# Patient Record
Sex: Male | Born: 1935 | Race: White | Hispanic: No | Marital: Married | Smoking: Former smoker
Health system: Southern US, Community
[De-identification: ages and names within clinical notes are randomized; demographics above are authoritative.]

## PROBLEM LIST (undated history)

## (undated) DIAGNOSIS — G629 Polyneuropathy, unspecified: Secondary | ICD-10-CM

## (undated) DIAGNOSIS — N400 Enlarged prostate without lower urinary tract symptoms: Secondary | ICD-10-CM

## (undated) DIAGNOSIS — M869 Osteomyelitis, unspecified: Secondary | ICD-10-CM

## (undated) DIAGNOSIS — F1910 Other psychoactive substance abuse, uncomplicated: Secondary | ICD-10-CM

## (undated) DIAGNOSIS — Z8711 Personal history of peptic ulcer disease: Secondary | ICD-10-CM

## (undated) DIAGNOSIS — F329 Major depressive disorder, single episode, unspecified: Secondary | ICD-10-CM

## (undated) DIAGNOSIS — H269 Unspecified cataract: Secondary | ICD-10-CM

## (undated) DIAGNOSIS — M1990 Unspecified osteoarthritis, unspecified site: Secondary | ICD-10-CM

## (undated) DIAGNOSIS — I739 Peripheral vascular disease, unspecified: Secondary | ICD-10-CM

## (undated) DIAGNOSIS — E039 Hypothyroidism, unspecified: Secondary | ICD-10-CM

## (undated) DIAGNOSIS — R51 Headache: Secondary | ICD-10-CM

## (undated) DIAGNOSIS — M2540 Effusion, unspecified joint: Secondary | ICD-10-CM

## (undated) DIAGNOSIS — G4700 Insomnia, unspecified: Secondary | ICD-10-CM

## (undated) DIAGNOSIS — R1013 Epigastric pain: Secondary | ICD-10-CM

## (undated) DIAGNOSIS — C9000 Multiple myeloma not having achieved remission: Secondary | ICD-10-CM

## (undated) DIAGNOSIS — K279 Peptic ulcer, site unspecified, unspecified as acute or chronic, without hemorrhage or perforation: Secondary | ICD-10-CM

## (undated) DIAGNOSIS — D649 Anemia, unspecified: Secondary | ICD-10-CM

## (undated) DIAGNOSIS — M2550 Pain in unspecified joint: Secondary | ICD-10-CM

## (undated) DIAGNOSIS — I6529 Occlusion and stenosis of unspecified carotid artery: Secondary | ICD-10-CM

## (undated) DIAGNOSIS — F419 Anxiety disorder, unspecified: Secondary | ICD-10-CM

## (undated) DIAGNOSIS — K219 Gastro-esophageal reflux disease without esophagitis: Secondary | ICD-10-CM

## (undated) DIAGNOSIS — R609 Edema, unspecified: Secondary | ICD-10-CM

## (undated) DIAGNOSIS — R350 Frequency of micturition: Secondary | ICD-10-CM

## (undated) DIAGNOSIS — E785 Hyperlipidemia, unspecified: Secondary | ICD-10-CM

## (undated) DIAGNOSIS — M109 Gout, unspecified: Secondary | ICD-10-CM

## (undated) DIAGNOSIS — K5900 Constipation, unspecified: Secondary | ICD-10-CM

## (undated) DIAGNOSIS — F32A Depression, unspecified: Secondary | ICD-10-CM

## (undated) DIAGNOSIS — N189 Chronic kidney disease, unspecified: Secondary | ICD-10-CM

## (undated) DIAGNOSIS — M48061 Spinal stenosis, lumbar region without neurogenic claudication: Secondary | ICD-10-CM

## (undated) DIAGNOSIS — M549 Dorsalgia, unspecified: Secondary | ICD-10-CM

## (undated) DIAGNOSIS — I10 Essential (primary) hypertension: Secondary | ICD-10-CM

## (undated) DIAGNOSIS — K5790 Diverticulosis of intestine, part unspecified, without perforation or abscess without bleeding: Secondary | ICD-10-CM

## (undated) DIAGNOSIS — J189 Pneumonia, unspecified organism: Secondary | ICD-10-CM

## (undated) DIAGNOSIS — G8929 Other chronic pain: Secondary | ICD-10-CM

## (undated) DIAGNOSIS — R600 Localized edema: Secondary | ICD-10-CM

## (undated) HISTORY — DX: Spinal stenosis, lumbar region without neurogenic claudication: M48.061

## (undated) HISTORY — PX: VASECTOMY: SHX75

## (undated) HISTORY — DX: Other psychoactive substance abuse, uncomplicated: F19.10

## (undated) HISTORY — DX: Unspecified osteoarthritis, unspecified site: M19.90

## (undated) HISTORY — PX: EYE SURGERY: SHX253

## (undated) HISTORY — DX: Pneumonia, unspecified organism: J18.9

## (undated) HISTORY — DX: Diverticulosis of intestine, part unspecified, without perforation or abscess without bleeding: K57.90

## (undated) HISTORY — DX: Peptic ulcer, site unspecified, unspecified as acute or chronic, without hemorrhage or perforation: K27.9

## (undated) HISTORY — PX: OTHER SURGICAL HISTORY: SHX169

## (undated) HISTORY — DX: Major depressive disorder, single episode, unspecified: F32.9

## (undated) HISTORY — DX: Hypothyroidism, unspecified: E03.9

## (undated) HISTORY — DX: Occlusion and stenosis of unspecified carotid artery: I65.29

## (undated) HISTORY — PX: ESOPHAGOGASTRODUODENOSCOPY: SHX1529

## (undated) HISTORY — DX: Personal history of peptic ulcer disease: Z87.11

## (undated) HISTORY — DX: Depression, unspecified: F32.A

## (undated) HISTORY — DX: Anemia, unspecified: D64.9

## (undated) HISTORY — DX: Peripheral vascular disease, unspecified: I73.9

## (undated) HISTORY — DX: Chronic kidney disease, unspecified: N18.9

## (undated) HISTORY — DX: Gout, unspecified: M10.9

## (undated) HISTORY — DX: Multiple myeloma not having achieved remission: C90.00

## (undated) HISTORY — DX: Benign prostatic hyperplasia without lower urinary tract symptoms: N40.0

## (undated) HISTORY — PX: TONSILLECTOMY: SUR1361

## (undated) HISTORY — DX: Hyperlipidemia, unspecified: E78.5

## (undated) HISTORY — DX: Unspecified cataract: H26.9

## (undated) HISTORY — DX: Epigastric pain: R10.13

## (undated) SURGERY — EGD (ESOPHAGOGASTRODUODENOSCOPY)
Anesthesia: Moderate Sedation

---

## 1997-06-12 HISTORY — PX: OTHER SURGICAL HISTORY: SHX169

## 1998-03-12 HISTORY — PX: ABDOMINAL AORTIC ANEURYSM REPAIR: SHX42

## 1998-03-26 ENCOUNTER — Encounter: Payer: Self-pay | Admitting: Vascular Surgery

## 1998-03-26 ENCOUNTER — Observation Stay (HOSPITAL_COMMUNITY): Admission: RE | Admit: 1998-03-26 | Discharge: 1998-03-27 | Payer: Self-pay | Admitting: Urology

## 1998-03-31 ENCOUNTER — Encounter: Payer: Self-pay | Admitting: Vascular Surgery

## 1998-03-31 ENCOUNTER — Inpatient Hospital Stay: Admission: RE | Admit: 1998-03-31 | Discharge: 1998-04-12 | Payer: Self-pay | Admitting: Vascular Surgery

## 1998-04-01 ENCOUNTER — Encounter: Payer: Self-pay | Admitting: Vascular Surgery

## 1998-04-06 ENCOUNTER — Encounter: Payer: Self-pay | Admitting: Vascular Surgery

## 1998-04-08 ENCOUNTER — Encounter: Payer: Self-pay | Admitting: Vascular Surgery

## 1998-06-12 HISTORY — PX: HERNIA REPAIR: SHX51

## 1998-06-12 HISTORY — PX: ABDOMINAL HERNIA REPAIR: SHX539

## 1998-10-26 ENCOUNTER — Ambulatory Visit (HOSPITAL_COMMUNITY): Admission: RE | Admit: 1998-10-26 | Discharge: 1998-10-26 | Payer: Self-pay

## 1999-02-25 ENCOUNTER — Encounter: Payer: Self-pay | Admitting: Vascular Surgery

## 1999-02-28 ENCOUNTER — Inpatient Hospital Stay (HOSPITAL_COMMUNITY): Admission: RE | Admit: 1999-02-28 | Discharge: 1999-03-08 | Payer: Self-pay | Admitting: Vascular Surgery

## 1999-03-04 ENCOUNTER — Encounter: Payer: Self-pay | Admitting: Vascular Surgery

## 1999-11-22 ENCOUNTER — Inpatient Hospital Stay (HOSPITAL_COMMUNITY): Admission: RE | Admit: 1999-11-22 | Discharge: 1999-11-25 | Payer: Self-pay | Admitting: General Surgery

## 2003-11-26 ENCOUNTER — Ambulatory Visit (HOSPITAL_COMMUNITY): Admission: RE | Admit: 2003-11-26 | Discharge: 2003-11-26 | Payer: Self-pay | Admitting: Family Medicine

## 2003-11-26 ENCOUNTER — Inpatient Hospital Stay (HOSPITAL_COMMUNITY): Admission: EM | Admit: 2003-11-26 | Discharge: 2003-11-28 | Payer: Self-pay | Admitting: Emergency Medicine

## 2003-11-26 ENCOUNTER — Encounter: Admission: RE | Admit: 2003-11-26 | Discharge: 2003-11-26 | Payer: Self-pay | Admitting: Family Medicine

## 2004-02-22 ENCOUNTER — Emergency Department (HOSPITAL_COMMUNITY): Admission: EM | Admit: 2004-02-22 | Discharge: 2004-02-22 | Payer: Self-pay | Admitting: Emergency Medicine

## 2004-07-18 LAB — HM COLONOSCOPY: HM Colonoscopy: NEGATIVE

## 2004-07-22 ENCOUNTER — Ambulatory Visit (HOSPITAL_COMMUNITY): Admission: RE | Admit: 2004-07-22 | Discharge: 2004-07-22 | Payer: Self-pay | Admitting: Gastroenterology

## 2004-07-22 ENCOUNTER — Encounter (INDEPENDENT_AMBULATORY_CARE_PROVIDER_SITE_OTHER): Payer: Self-pay | Admitting: Specialist

## 2004-07-22 HISTORY — PX: COLONOSCOPY: SHX174

## 2005-09-26 ENCOUNTER — Encounter: Admission: RE | Admit: 2005-09-26 | Discharge: 2005-10-30 | Payer: Self-pay | Admitting: Family Medicine

## 2006-01-15 ENCOUNTER — Encounter: Admission: RE | Admit: 2006-01-15 | Discharge: 2006-01-15 | Payer: Self-pay | Admitting: Family Medicine

## 2006-01-22 ENCOUNTER — Ambulatory Visit: Payer: Self-pay | Admitting: Family Medicine

## 2006-03-26 ENCOUNTER — Ambulatory Visit: Payer: Self-pay | Admitting: Family Medicine

## 2006-03-30 ENCOUNTER — Ambulatory Visit: Payer: Self-pay | Admitting: Family Medicine

## 2006-04-05 ENCOUNTER — Ambulatory Visit: Payer: Self-pay | Admitting: Family Medicine

## 2006-04-17 ENCOUNTER — Ambulatory Visit: Payer: Self-pay

## 2006-04-27 ENCOUNTER — Ambulatory Visit: Payer: Self-pay

## 2006-05-07 ENCOUNTER — Ambulatory Visit: Payer: Self-pay | Admitting: Cardiology

## 2006-05-24 ENCOUNTER — Ambulatory Visit: Payer: Self-pay | Admitting: Family Medicine

## 2006-05-25 ENCOUNTER — Encounter: Admission: RE | Admit: 2006-05-25 | Discharge: 2006-05-25 | Payer: Self-pay | Admitting: Family Medicine

## 2006-06-20 ENCOUNTER — Ambulatory Visit: Payer: Self-pay | Admitting: Cardiology

## 2006-06-21 ENCOUNTER — Encounter (INDEPENDENT_AMBULATORY_CARE_PROVIDER_SITE_OTHER): Payer: Self-pay | Admitting: Cardiology

## 2006-06-21 LAB — CONVERTED CEMR LAB: Glucose, Bld: 93 mg/dL (ref 70–99)

## 2006-07-13 ENCOUNTER — Ambulatory Visit: Payer: Self-pay | Admitting: Hematology & Oncology

## 2006-07-13 ENCOUNTER — Ambulatory Visit: Payer: Self-pay | Admitting: Family Medicine

## 2006-07-16 ENCOUNTER — Ambulatory Visit (HOSPITAL_COMMUNITY): Admission: RE | Admit: 2006-07-16 | Discharge: 2006-07-16 | Payer: Self-pay | Admitting: Family Medicine

## 2006-07-17 ENCOUNTER — Ambulatory Visit: Payer: Self-pay

## 2006-08-01 LAB — CBC WITH DIFFERENTIAL/PLATELET
BASO%: 0.4 % (ref 0.0–2.0)
EOS%: 11.7 % — ABNORMAL HIGH (ref 0.0–7.0)
MCH: 36 pg — ABNORMAL HIGH (ref 28.0–33.4)
MCHC: 34.9 g/dL (ref 32.0–35.9)
RBC: 3.33 10*6/uL — ABNORMAL LOW (ref 4.20–5.71)
RDW: 14.7 % — ABNORMAL HIGH (ref 11.2–14.6)
lymph#: 1.7 10*3/uL (ref 0.9–3.3)

## 2006-08-01 LAB — CHCC SMEAR

## 2006-08-04 LAB — KAPPA/LAMBDA LIGHT CHAINS
Kappa free light chain: 2.89 mg/dL — ABNORMAL HIGH (ref 0.33–1.94)
Lambda Free Lght Chn: 20.1 mg/dL — ABNORMAL HIGH (ref 0.57–2.63)

## 2006-08-04 LAB — COMPREHENSIVE METABOLIC PANEL
ALT: 41 U/L (ref 0–53)
AST: 49 U/L — ABNORMAL HIGH (ref 0–37)
Alkaline Phosphatase: 88 U/L (ref 39–117)
Creatinine, Ser: 1.49 mg/dL (ref 0.40–1.50)
Sodium: 138 mEq/L (ref 135–145)
Total Bilirubin: 0.7 mg/dL (ref 0.3–1.2)
Total Protein: 7.5 g/dL (ref 6.0–8.3)

## 2006-08-04 LAB — PROTEIN ELECTROPHORESIS, SERUM
Albumin ELP: 58.2 % (ref 55.8–66.1)
Alpha-2-Globulin: 11.9 % — ABNORMAL HIGH (ref 7.1–11.8)
Beta Globulin: 6.7 % (ref 4.7–7.2)
Total Protein, Serum Electrophoresis: 7.5 g/dL (ref 6.0–8.3)

## 2006-08-04 LAB — BETA 2 MICROGLOBULIN, SERUM: Beta-2 Microglobulin: 4.32 mg/L — ABNORMAL HIGH (ref 1.01–1.73)

## 2006-08-04 LAB — IGG, IGA, IGM: IgM, Serum: 49 mg/dL — ABNORMAL LOW (ref 60–263)

## 2006-08-06 ENCOUNTER — Encounter (INDEPENDENT_AMBULATORY_CARE_PROVIDER_SITE_OTHER): Payer: Self-pay | Admitting: *Deleted

## 2006-08-06 ENCOUNTER — Ambulatory Visit (HOSPITAL_COMMUNITY): Admission: RE | Admit: 2006-08-06 | Discharge: 2006-08-06 | Payer: Self-pay | Admitting: Hematology & Oncology

## 2006-08-06 LAB — UIFE/LIGHT CHAINS/TP QN, 24-HR UR
Albumin, U: DETECTED
Alpha 1, Urine: DETECTED — AB
Beta, Urine: DETECTED — AB
Free Kappa Lt Chains,Ur: 8.33 mg/dL — ABNORMAL HIGH (ref 0.04–1.51)
Free Lambda Excretion/Day: 540.35 mg/d
Free Lambda Lt Chains,Ur: 21.4 mg/dL — ABNORMAL HIGH (ref 0.08–1.01)
Gamma Globulin, Urine: DETECTED — AB
Time: 24 hours
Volume, Urine: 2525 mL

## 2006-09-10 ENCOUNTER — Ambulatory Visit: Payer: Self-pay | Admitting: Family Medicine

## 2006-09-17 ENCOUNTER — Ambulatory Visit: Payer: Self-pay | Admitting: *Deleted

## 2006-09-17 ENCOUNTER — Inpatient Hospital Stay (HOSPITAL_COMMUNITY): Admission: RE | Admit: 2006-09-17 | Discharge: 2006-09-19 | Payer: Self-pay | Admitting: *Deleted

## 2006-10-15 ENCOUNTER — Ambulatory Visit: Payer: Self-pay | Admitting: Hematology & Oncology

## 2006-10-17 LAB — CBC WITH DIFFERENTIAL/PLATELET
Eosinophils Absolute: 0.2 10*3/uL (ref 0.0–0.5)
HCT: 31.9 % — ABNORMAL LOW (ref 38.7–49.9)
LYMPH%: 20.7 % (ref 14.0–48.0)
MCV: 99.8 fL — ABNORMAL HIGH (ref 81.6–98.0)
MONO#: 0.9 10*3/uL (ref 0.1–0.9)
MONO%: 13.1 % — ABNORMAL HIGH (ref 0.0–13.0)
NEUT#: 4.2 10*3/uL (ref 1.5–6.5)
NEUT%: 62.9 % (ref 40.0–75.0)
Platelets: 183 10*3/uL (ref 145–400)
RBC: 3.2 10*6/uL — ABNORMAL LOW (ref 4.20–5.71)
WBC: 6.7 10*3/uL (ref 4.0–10.0)

## 2006-10-19 LAB — IGG, IGA, IGM
IgA: 293 mg/dL (ref 68–378)
IgG (Immunoglobin G), Serum: 916 mg/dL (ref 694–1618)
IgM, Serum: 34 mg/dL — ABNORMAL LOW (ref 60–263)

## 2006-10-19 LAB — PROTEIN ELECTROPHORESIS, SERUM
Albumin ELP: 56.6 % (ref 55.8–66.1)
Alpha-1-Globulin: 4.5 % (ref 2.9–4.9)
Beta 2: 6.9 % — ABNORMAL HIGH (ref 3.2–6.5)
Beta Globulin: 7 % (ref 4.7–7.2)
Total Protein, Serum Electrophoresis: 6.7 g/dL (ref 6.0–8.3)

## 2006-10-19 LAB — COMPREHENSIVE METABOLIC PANEL
Alkaline Phosphatase: 67 U/L (ref 39–117)
BUN: 27 mg/dL — ABNORMAL HIGH (ref 6–23)
CO2: 24 mEq/L (ref 19–32)
Creatinine, Ser: 1.24 mg/dL (ref 0.40–1.50)
Glucose, Bld: 108 mg/dL — ABNORMAL HIGH (ref 70–99)
Total Bilirubin: 0.5 mg/dL (ref 0.3–1.2)
Total Protein: 6.7 g/dL (ref 6.0–8.3)

## 2006-10-23 LAB — UIFE/LIGHT CHAINS/TP QN, 24-HR UR
Alpha 1, Urine: DETECTED — AB
Free Kappa Lt Chains,Ur: 3.33 mg/dL — ABNORMAL HIGH (ref 0.04–1.51)
Free Kappa/Lambda Ratio: 0.17 ratio — ABNORMAL LOW (ref 0.46–4.00)
Free Lambda Excretion/Day: 565.5 mg/d
Free Lt Chn Excr Rate: 96.57 mg/d
Time: 24 hours
Total Protein, Urine-Ur/day: 676 mg/d — ABNORMAL HIGH (ref 10–140)
Total Protein, Urine: 23.3 mg/dL

## 2006-10-31 ENCOUNTER — Ambulatory Visit: Payer: Self-pay | Admitting: Family Medicine

## 2006-11-06 ENCOUNTER — Encounter: Admission: RE | Admit: 2006-11-06 | Discharge: 2006-11-06 | Payer: Self-pay | Admitting: Specialist

## 2006-11-27 ENCOUNTER — Ambulatory Visit: Payer: Self-pay | Admitting: Family Medicine

## 2007-01-03 ENCOUNTER — Ambulatory Visit: Payer: Self-pay | Admitting: Family Medicine

## 2007-01-31 ENCOUNTER — Ambulatory Visit: Payer: Self-pay | Admitting: Family Medicine

## 2007-03-21 ENCOUNTER — Ambulatory Visit: Payer: Self-pay | Admitting: Hematology & Oncology

## 2007-03-25 LAB — CBC WITH DIFFERENTIAL/PLATELET
Basophils Absolute: 0 10*3/uL (ref 0.0–0.1)
Eosinophils Absolute: 0.3 10*3/uL (ref 0.0–0.5)
HGB: 11.9 g/dL — ABNORMAL LOW (ref 13.0–17.1)
LYMPH%: 31 % (ref 14.0–48.0)
MCV: 90.7 fL (ref 81.6–98.0)
MONO%: 10.2 % (ref 0.0–13.0)
NEUT#: 2.9 10*3/uL (ref 1.5–6.5)
NEUT%: 53.4 % (ref 40.0–75.0)
Platelets: 190 10*3/uL (ref 145–400)

## 2007-03-25 LAB — UIFE/LIGHT CHAINS/TP QN, 24-HR UR
Free Kappa Lt Chains,Ur: 6.04 mg/dL — ABNORMAL HIGH (ref 0.04–1.51)
Free Lt Chn Excr Rate: 120.8 mg/d
Gamma Globulin, Urine: DETECTED — AB
Time: 24 hours

## 2007-03-27 LAB — COMPREHENSIVE METABOLIC PANEL
Albumin: 4.3 g/dL (ref 3.5–5.2)
Alkaline Phosphatase: 46 U/L (ref 39–117)
BUN: 55 mg/dL — ABNORMAL HIGH (ref 6–23)
Creatinine, Ser: 2.68 mg/dL — ABNORMAL HIGH (ref 0.40–1.50)
Glucose, Bld: 93 mg/dL (ref 70–99)
Total Bilirubin: 0.5 mg/dL (ref 0.3–1.2)

## 2007-03-27 LAB — BETA 2 MICROGLOBULIN, SERUM: Beta-2 Microglobulin: 4.73 mg/L — ABNORMAL HIGH (ref 1.01–1.73)

## 2007-03-27 LAB — PROTEIN ELECTROPHORESIS, SERUM
Alpha-2-Globulin: 10.3 % (ref 7.1–11.8)
Beta Globulin: 11.9 % — ABNORMAL HIGH (ref 4.7–7.2)
Gamma Globulin: 10.4 % — ABNORMAL LOW (ref 11.1–18.8)
M-Spike, %: 0.39 g/dL
Total Protein, Serum Electrophoresis: 6.8 g/dL (ref 6.0–8.3)

## 2007-03-27 LAB — KAPPA/LAMBDA LIGHT CHAINS: Kappa free light chain: 1.47 mg/dL (ref 0.33–1.94)

## 2007-03-27 LAB — IGG, IGA, IGM
IgA: 307 mg/dL (ref 68–378)
IgM, Serum: 39 mg/dL — ABNORMAL LOW (ref 60–263)

## 2007-04-02 ENCOUNTER — Ambulatory Visit: Payer: Self-pay | Admitting: Family Medicine

## 2007-06-03 ENCOUNTER — Ambulatory Visit: Payer: Self-pay | Admitting: Family Medicine

## 2007-06-28 ENCOUNTER — Ambulatory Visit: Payer: Self-pay | Admitting: Family Medicine

## 2007-08-01 ENCOUNTER — Ambulatory Visit: Payer: Self-pay | Admitting: Hematology & Oncology

## 2007-08-12 LAB — CBC WITH DIFFERENTIAL/PLATELET
Basophils Absolute: 0 10*3/uL (ref 0.0–0.1)
Eosinophils Absolute: 0.4 10*3/uL (ref 0.0–0.5)
HCT: 32.6 % — ABNORMAL LOW (ref 38.7–49.9)
HGB: 11.3 g/dL — ABNORMAL LOW (ref 13.0–17.1)
MONO#: 0.6 10*3/uL (ref 0.1–0.9)
NEUT#: 3 10*3/uL (ref 1.5–6.5)
NEUT%: 53.7 % (ref 40.0–75.0)
WBC: 5.5 10*3/uL (ref 4.0–10.0)
lymph#: 1.5 10*3/uL (ref 0.9–3.3)

## 2007-08-16 LAB — PROTEIN ELECTROPHORESIS, SERUM
Alpha-1-Globulin: 4 % (ref 2.9–4.9)
Beta 2: 2.5 % — ABNORMAL LOW (ref 3.2–6.5)
Gamma Globulin: 9.8 % — ABNORMAL LOW (ref 11.1–18.8)

## 2007-08-16 LAB — UIFE/LIGHT CHAINS/TP QN, 24-HR UR
Free Kappa/Lambda Ratio: 0.19 ratio — ABNORMAL LOW (ref 0.46–4.00)
Free Lambda Lt Chains,Ur: 16.4 mg/dL — ABNORMAL HIGH (ref 0.08–1.01)
Free Lt Chn Excr Rate: 57.91 mg/d
Total Protein, Urine-Ur/day: 368 mg/d — ABNORMAL HIGH (ref 10–140)
Total Protein, Urine: 19.9 mg/dL
Volume, Urine: 1850 mL

## 2007-08-16 LAB — COMPREHENSIVE METABOLIC PANEL
ALT: 9 U/L (ref 0–53)
AST: 16 U/L (ref 0–37)
Alkaline Phosphatase: 37 U/L — ABNORMAL LOW (ref 39–117)
Glucose, Bld: 90 mg/dL (ref 70–99)
Sodium: 139 mEq/L (ref 135–145)
Total Bilirubin: 0.5 mg/dL (ref 0.3–1.2)
Total Protein: 6.8 g/dL (ref 6.0–8.3)

## 2007-08-16 LAB — KAPPA/LAMBDA LIGHT CHAINS: Kappa:Lambda Ratio: 0.15 — ABNORMAL LOW (ref 0.26–1.65)

## 2007-08-16 LAB — IGG, IGA, IGM: IgA: 238 mg/dL (ref 68–378)

## 2007-10-01 ENCOUNTER — Ambulatory Visit: Payer: Self-pay | Admitting: Family Medicine

## 2007-10-25 ENCOUNTER — Ambulatory Visit: Payer: Self-pay | Admitting: Family Medicine

## 2008-02-10 ENCOUNTER — Ambulatory Visit: Payer: Self-pay | Admitting: Hematology & Oncology

## 2008-02-25 ENCOUNTER — Ambulatory Visit: Payer: Self-pay | Admitting: Family Medicine

## 2008-03-03 ENCOUNTER — Ambulatory Visit: Payer: Self-pay | Admitting: Family Medicine

## 2008-03-12 ENCOUNTER — Ambulatory Visit: Payer: Self-pay | Admitting: Hematology & Oncology

## 2008-03-13 LAB — CBC WITH DIFFERENTIAL (CANCER CENTER ONLY)
Eosinophils Absolute: 0.9 10*3/uL — ABNORMAL HIGH (ref 0.0–0.5)
HCT: 31.7 % — ABNORMAL LOW (ref 38.7–49.9)
LYMPH%: 27.1 % (ref 14.0–48.0)
MCH: 30.6 pg (ref 28.0–33.4)
MCV: 91 fL (ref 82–98)
MONO#: 0.5 10*3/uL (ref 0.1–0.9)
MONO%: 8.4 % (ref 0.0–13.0)
NEUT%: 48.1 % (ref 40.0–80.0)
RBC: 3.46 10*6/uL — ABNORMAL LOW (ref 4.20–5.70)
WBC: 5.5 10*3/uL (ref 4.0–10.0)

## 2008-03-13 LAB — COMPREHENSIVE METABOLIC PANEL
Albumin: 4.3 g/dL (ref 3.5–5.2)
Alkaline Phosphatase: 42 U/L (ref 39–117)
BUN: 48 mg/dL — ABNORMAL HIGH (ref 6–23)
CO2: 15 mEq/L — ABNORMAL LOW (ref 19–32)
Glucose, Bld: 95 mg/dL (ref 70–99)
Sodium: 136 mEq/L (ref 135–145)
Total Bilirubin: 0.4 mg/dL (ref 0.3–1.2)
Total Protein: 6.9 g/dL (ref 6.0–8.3)

## 2008-03-17 LAB — SPEP & IFE WITH QIG
Alpha-1-Globulin: 4 % (ref 2.9–4.9)
Beta 2: 2.8 % — ABNORMAL LOW (ref 3.2–6.5)
Gamma Globulin: 11.3 % (ref 11.1–18.8)
IgA: 237 mg/dL (ref 68–378)
IgM, Serum: 35 mg/dL — ABNORMAL LOW (ref 60–263)

## 2008-03-17 LAB — KAPPA/LAMBDA LIGHT CHAINS
Kappa:Lambda Ratio: 0.13 — ABNORMAL LOW (ref 0.26–1.65)
Lambda Free Lght Chn: 23.4 mg/dL — ABNORMAL HIGH (ref 0.57–2.63)

## 2008-03-17 LAB — UIFE/LIGHT CHAINS/TP QN, 24-HR UR
Alpha 1, Urine: DETECTED — AB
Alpha 2, Urine: DETECTED — AB
Beta, Urine: DETECTED — AB
Free Lt Chn Excr Rate: 311.68 mg/d
Total Protein, Urine-Ur/day: 855 mg/d — ABNORMAL HIGH (ref 10–140)

## 2008-05-06 ENCOUNTER — Encounter: Admission: RE | Admit: 2008-05-06 | Discharge: 2008-05-06 | Payer: Self-pay | Admitting: Family Medicine

## 2008-05-06 ENCOUNTER — Ambulatory Visit: Payer: Self-pay | Admitting: Family Medicine

## 2008-05-11 ENCOUNTER — Encounter: Admission: RE | Admit: 2008-05-11 | Discharge: 2008-05-11 | Payer: Self-pay | Admitting: Family Medicine

## 2008-05-13 ENCOUNTER — Ambulatory Visit: Payer: Self-pay | Admitting: Family Medicine

## 2008-06-17 ENCOUNTER — Ambulatory Visit: Payer: Self-pay | Admitting: Family Medicine

## 2008-09-16 ENCOUNTER — Ambulatory Visit: Payer: Self-pay | Admitting: Hematology & Oncology

## 2008-09-17 ENCOUNTER — Ambulatory Visit: Payer: Self-pay | Admitting: Family Medicine

## 2008-12-14 ENCOUNTER — Emergency Department (HOSPITAL_COMMUNITY): Admission: EM | Admit: 2008-12-14 | Discharge: 2008-12-14 | Payer: Self-pay | Admitting: Emergency Medicine

## 2009-02-11 ENCOUNTER — Ambulatory Visit: Payer: Self-pay | Admitting: Family Medicine

## 2009-02-18 ENCOUNTER — Ambulatory Visit: Payer: Self-pay | Admitting: Family Medicine

## 2009-03-19 ENCOUNTER — Ambulatory Visit: Payer: Self-pay | Admitting: Family Medicine

## 2009-06-17 ENCOUNTER — Ambulatory Visit: Payer: Self-pay | Admitting: Family Medicine

## 2009-09-08 ENCOUNTER — Encounter: Admission: RE | Admit: 2009-09-08 | Discharge: 2009-09-08 | Payer: Self-pay | Admitting: Family Medicine

## 2009-10-13 ENCOUNTER — Ambulatory Visit: Payer: Self-pay | Admitting: Family Medicine

## 2009-10-15 ENCOUNTER — Emergency Department (HOSPITAL_COMMUNITY): Admission: EM | Admit: 2009-10-15 | Discharge: 2009-10-15 | Payer: Self-pay | Admitting: Emergency Medicine

## 2009-10-20 ENCOUNTER — Ambulatory Visit: Payer: Self-pay | Admitting: Family Medicine

## 2009-10-20 ENCOUNTER — Encounter: Admission: RE | Admit: 2009-10-20 | Discharge: 2009-10-20 | Payer: Self-pay | Admitting: Family Medicine

## 2009-12-22 ENCOUNTER — Observation Stay (HOSPITAL_COMMUNITY): Admission: EM | Admit: 2009-12-22 | Discharge: 2009-12-23 | Payer: Self-pay | Admitting: Emergency Medicine

## 2009-12-22 ENCOUNTER — Ambulatory Visit: Payer: Self-pay | Admitting: Family Medicine

## 2010-02-16 ENCOUNTER — Ambulatory Visit: Payer: Self-pay | Admitting: Family Medicine

## 2010-05-23 ENCOUNTER — Ambulatory Visit: Payer: Self-pay | Admitting: Family Medicine

## 2010-06-07 ENCOUNTER — Ambulatory Visit
Admission: RE | Admit: 2010-06-07 | Discharge: 2010-06-07 | Payer: Self-pay | Source: Home / Self Care | Attending: Family Medicine | Admitting: Family Medicine

## 2010-06-20 ENCOUNTER — Ambulatory Visit
Admission: RE | Admit: 2010-06-20 | Discharge: 2010-06-20 | Payer: Self-pay | Source: Home / Self Care | Attending: Family Medicine | Admitting: Family Medicine

## 2010-06-22 ENCOUNTER — Ambulatory Visit: Admit: 2010-06-22 | Payer: Self-pay | Admitting: Orthopedic Surgery

## 2010-07-03 ENCOUNTER — Encounter: Payer: Self-pay | Admitting: Specialist

## 2010-07-03 ENCOUNTER — Encounter: Payer: Self-pay | Admitting: Family Medicine

## 2010-08-28 LAB — GLUCOSE, CAPILLARY
Glucose-Capillary: 172 mg/dL — ABNORMAL HIGH (ref 70–99)
Glucose-Capillary: 77 mg/dL (ref 70–99)

## 2010-08-28 LAB — URINE MICROSCOPIC-ADD ON

## 2010-08-28 LAB — FOLATE: Folate: >20 ng/mL

## 2010-08-28 LAB — PROTIME-INR: Prothrombin Time: 14.1 seconds (ref 11.6–15.2)

## 2010-08-28 LAB — BASIC METABOLIC PANEL
BUN: 27 mg/dL — ABNORMAL HIGH (ref 6–23)
Chloride: 110 mEq/L (ref 96–112)
GFR calc non Af Amer: >60 mL/min (ref >60)
Glucose, Bld: 130 mg/dL — ABNORMAL HIGH (ref 70–99)
Potassium: 4.9 mEq/L (ref 3.5–5.1)
Sodium: 139 mEq/L (ref 135–145)

## 2010-08-28 LAB — COMPREHENSIVE METABOLIC PANEL
ALT: 14 U/L (ref 0–53)
Albumin: 3.3 g/dL — ABNORMAL LOW (ref 3.5–5.2)
Alkaline Phosphatase: 58 U/L (ref 39–117)
Calcium: 8.8 mg/dL (ref 8.4–10.5)
Chloride: 114 mEq/L — ABNORMAL HIGH (ref 96–112)
GFR calc Af Amer: 55 mL/min — ABNORMAL LOW (ref >60)
Potassium: 4.6 mEq/L (ref 3.5–5.1)
Total Bilirubin: 0.3 mg/dL (ref 0.3–1.2)

## 2010-08-28 LAB — LIPASE, BLOOD: Lipase: 20 U/L (ref 11–59)

## 2010-08-28 LAB — CBC
HCT: 29.4 % — ABNORMAL LOW (ref 39.0–52.0)
MCHC: 33.1 g/dL (ref 30.0–36.0)
MCHC: 34.1 g/dL (ref 30.0–36.0)
Platelets: 163 10*3/uL (ref 150–400)
RDW: 13.2 % (ref 11.5–15.5)
RDW: 13.3 % (ref 11.5–15.5)
WBC: 5.8 10*3/uL (ref 4.0–10.5)

## 2010-08-28 LAB — TSH: TSH: 4.174 u[IU]/mL (ref 0.350–4.500)

## 2010-08-28 LAB — CK TOTAL AND CKMB (NOT AT ARMC): CK, MB: 1.6 ng/mL (ref 0.3–4.0)

## 2010-08-28 LAB — DIFFERENTIAL
Eosinophils Absolute: 0.7 10*3/uL (ref 0.0–0.7)
Eosinophils Relative: 12 % — ABNORMAL HIGH (ref 0–5)
Monocytes Absolute: 0.7 10*3/uL (ref 0.1–1.0)
Neutrophils Relative %: 55 % (ref 43–77)

## 2010-08-28 LAB — URINALYSIS, ROUTINE W REFLEX MICROSCOPIC
Hgb urine dipstick: NEGATIVE
Protein, ur: NEGATIVE mg/dL
Specific Gravity, Urine: 1.021 (ref 1.005–1.030)
Urobilinogen, UA: 0.2 mg/dL (ref 0.0–1.0)
pH: 5 (ref 5.0–8.0)

## 2010-08-28 LAB — CARDIAC PANEL(CRET KIN+CKTOT+MB+TROPI)
CK, MB: 1.6 ng/mL (ref 0.3–4.0)
Relative Index: INVALID (ref 0.0–2.5)
Total CK: 31 U/L (ref 7–232)
Troponin I: 0.03 ng/mL (ref 0.00–0.06)
Troponin I: <0.01 ng/mL (ref 0.00–0.06)

## 2010-08-28 LAB — RETICULOCYTES: Retic Count, Absolute: 85.4 10*3/uL (ref 19.0–186.0)

## 2010-08-28 LAB — POCT CARDIAC MARKERS: Myoglobin, poc: 111 ng/mL (ref 12–200)

## 2010-08-28 LAB — IRON AND TIBC
Iron: 86 ug/dL (ref 42–135)
Saturation Ratios: 31 % (ref 20–55)
TIBC: 274 ug/dL (ref 215–435)

## 2010-08-28 LAB — VITAMIN B12: Vitamin B-12: 502 pg/mL (ref 211–911)

## 2010-08-28 LAB — T4, FREE: Free T4: 1.11 ng/dL (ref 0.80–1.80)

## 2010-10-14 ENCOUNTER — Ambulatory Visit (INDEPENDENT_AMBULATORY_CARE_PROVIDER_SITE_OTHER): Payer: Medicare Other | Admitting: Medical

## 2010-10-14 DIAGNOSIS — R42 Dizziness and giddiness: Secondary | ICD-10-CM

## 2010-10-14 DIAGNOSIS — E039 Hypothyroidism, unspecified: Secondary | ICD-10-CM

## 2010-10-14 DIAGNOSIS — R197 Diarrhea, unspecified: Secondary | ICD-10-CM

## 2010-10-14 DIAGNOSIS — I998 Other disorder of circulatory system: Secondary | ICD-10-CM

## 2010-10-18 ENCOUNTER — Telehealth: Payer: Self-pay | Admitting: *Deleted

## 2010-10-18 NOTE — Telephone Encounter (Addendum)
Message copied by Ellsworth Lennox on Tue Oct 18, 2010  3:31 PM ------      Message from: [place], DAVID      Created: Tue Oct 18, 2010  2:13 PM       1) Labs - thyroid normal, diabetes marker A1C is worse than last check at 6.6%.  We can discuss further at next visit.      2) how is he feeling?  Is he hydrating well, has the dizziness improved?       3) how is he doing with the lower dose of Lisinopril (1/2 tablet daily)?        4) recheck in 1wk as we discussed with his regular provider   Pt notified of lab results.  Pt stated " not feeling any better, still feel dizzy.  I am still taking the whole dose of Lisinopril instead of 1/2 dose."  Scheduled for recheck on 10-21-10.  CM, LPN

## 2010-10-20 ENCOUNTER — Encounter: Payer: Self-pay | Admitting: Medical

## 2010-10-21 ENCOUNTER — Encounter: Payer: Self-pay | Admitting: Medical

## 2010-10-21 ENCOUNTER — Ambulatory Visit (INDEPENDENT_AMBULATORY_CARE_PROVIDER_SITE_OTHER): Payer: Medicare Other | Admitting: Medical

## 2010-10-21 VITALS — BP 140/80 | HR 72 | Ht 75.0 in | Wt 266.0 lb

## 2010-10-21 DIAGNOSIS — R202 Paresthesia of skin: Secondary | ICD-10-CM

## 2010-10-21 DIAGNOSIS — D649 Anemia, unspecified: Secondary | ICD-10-CM

## 2010-10-21 DIAGNOSIS — R42 Dizziness and giddiness: Secondary | ICD-10-CM

## 2010-10-21 DIAGNOSIS — R209 Unspecified disturbances of skin sensation: Secondary | ICD-10-CM

## 2010-10-21 DIAGNOSIS — N289 Disorder of kidney and ureter, unspecified: Secondary | ICD-10-CM

## 2010-10-21 DIAGNOSIS — E1129 Type 2 diabetes mellitus with other diabetic kidney complication: Secondary | ICD-10-CM | POA: Insufficient documentation

## 2010-10-21 DIAGNOSIS — E119 Type 2 diabetes mellitus without complications: Secondary | ICD-10-CM

## 2010-10-21 MED ORDER — AUTO-LANCET MISC: Status: DC

## 2010-10-21 MED ORDER — GLIPIZIDE 5 MG PO TABS: ORAL_TABLET | ORAL | Status: DC

## 2010-10-21 MED ORDER — GLUCOSE BLOOD VI STRP: ORAL_STRIP | Status: DC

## 2010-10-21 NOTE — Patient Instructions (Signed)
Dizziness - don't drink any alcohol for the next week or 2 until we see if the dizziness will resolve.  Begin wearing your new glasses today which will hopefully help.  Call report in 3-4 days.  Diabetes type II - discussed his new diagnosis.  Begin Glipizide 2.5mg  daily.  Discussed risks including possible low blood sugar and what to do if this occurs.   Advised that once we have the dizziness under control, then we can see him back in eval for preop clearance for upcoming knee surgery.

## 2010-10-21 NOTE — Progress Notes (Deleted)
Still having a lot of dizziness, with glasses off 75% goes away, new glasses ready for pickup.  No cradio change, no breathing change, no neuro change.  6.6%   Glucose 145 .  Surgery date august.  No hearing change, loss of hearing longstanding, not bad enour for hearing aid.  Right foot, gets burning at night. Stays red. Intolerant to chole meds - gets rahses, wheals, itcheing with all of them.  Stress test -  Last in [place], [address]  Normal.  Last CXR?  Increased urinary frquency.  Mild polydipsia.  Not skipping meals.  Dizziness currently every time he stands, lastss all day.   Renal stone surgery 1999, hospital - falls, right clavicle fracutre. Podiatry prior.

## 2010-10-21 NOTE — Progress Notes (Signed)
Subjective:    Patient ID: Zachary Vargas, male    DOB: 1928-05-28, 77 y.o.   MRN: [id number]  HPI 1) here for recheck.  Saw me on 10/14/10 for dizziness.  At that time given his recent diarrhea and decreased oral intake, we assumed some of the etiology could be dehydration vs. medication related.  Since 10/14/10 he has increased his hydration, not skipping meals, but he never cut his Lisinopril in half as his blood pressure remained fine.  He has checked his blood sugar periodically with an old meter, and was getting readings in the 140s.  He notes prior diagnosis of borderline diabetes.  He wonders if his sugar is affecting his dizziness.  He also notes that any time he takes his glasses off, the dizziness seems to resolve.  Otherwise, when wearing his glasses, he has dizziness that persists all day.  He actually goes to pick up his new glasses today.     2) He c/o right foot redness and burning sensation at times.  Denies hx/o neuropathy.  This is worse with showering.  He has seen podiatry prior in remote past, but was unhappy with that care, says the recommendations and interventions done then didn't help.  He notes that his feet rub in his shoes, no matter the shoe type, has thick nails, and gets right foot pain regularly.     3) He needs preop clearance for ortho.  Will be having knee replacement in August 2012.  He notes the last stress test was in [place] on [address], several years ago, normal.  Doesn't recall last CXR.    4) Also here to recheck on labs done last visit.      Review of Systems  Constitutional: Negative for fever, chills, diaphoresis, activity change, appetite change, fatigue and unexpected weight change.  HENT: Positive for hearing loss and rhinorrhea. Negative for ear pain, congestion, sore throat, sneezing, trouble swallowing, neck pain, neck stiffness, voice change, sinus pressure and tinnitus.        [Chronic hearing loss, occasional allergy congestion Eyes: Negative  for pain, redness, itching and visual disturbance.  Respiratory: Negative.  Negative for shortness of breath and wheezing.   Cardiovascular: Negative for chest pain, palpitations and leg swelling.  Gastrointestinal: Negative for nausea, vomiting, abdominal pain, diarrhea, constipation and blood in stool.  Genitourinary: Negative for dysuria, urgency, decreased urine volume and difficulty urinating.  Musculoskeletal: Positive for arthralgias and gait problem. Negative for myalgias, back pain and joint swelling.       [Knee pain due to "bone on bone", bilat, awaiting surgery in August Skin: Negative for pallor.  Neurological: Positive for dizziness. Negative for tremors, syncope, speech difficulty, weakness, numbness and headaches.  Hematological: Negative for adenopathy. Bruises/bleeds easily.  Psychiatric/Behavioral: Negative for sleep disturbance.   Past Medical History  Diagnosis Date  . Glaucoma   . Diabetes mellitus   . Anemia     of chronic disease  . History of aortic aneurysm repair   . Hypothyroidism   . Myeloma     IgA related  . History of renal stone   . Diverticulitis   . Dyslipidemia   . Colon polyps   . Melanoma in situ   . Gout   . Allergy   . BPH (benign prostatic hyperplasia)   . History of peptic ulcer disease       Objective:   Physical Exam  Constitutional: He is oriented to person, place, and time. He appears well-developed and well-nourished.  No distress.       overweight  HENT:  Head: Normocephalic.  Right Ear: Tympanic membrane and ear canal normal.  Left Ear: Tympanic membrane and ear canal normal.  Nose: Nose normal. No mucosal edema, rhinorrhea or sinus tenderness. Right sinus exhibits no maxillary sinus tenderness and no frontal sinus tenderness. Left sinus exhibits no maxillary sinus tenderness and no frontal sinus tenderness.  Mouth/Throat: Oropharynx is clear and moist and mucous membranes are normal. Normal dentition. No posterior  oropharyngeal erythema.  Eyes: Conjunctivae and EOM are normal. Right eye exhibits no discharge. Left eye exhibits no discharge. No scleral icterus.       Left eye not as reactive compared to right, +hx/o glaucoma  Neck: Normal range of motion. Neck supple. No JVD present. No thyromegaly present.       No bruits  Cardiovascular: Normal rate, regular rhythm and normal heart sounds.   No murmur heard.      Pedal pulses decreased compared to radial pulses, cap refill normal  Pulmonary/Chest: Effort normal and breath sounds normal. He has no wheezes.  Abdominal: Soft. Bowel sounds are normal. He exhibits no distension and no mass. There is no tenderness. There is no rebound and no guarding.       Vertical surgical scar+  Musculoskeletal: He exhibits edema and tenderness.       1+ edema or ankles, generalized tenderness of bilat knees, left knee with mild lateral laxity, difficulty getting up and down from chair given the knee pains  Lymphadenopathy:    He has no cervical adenopathy.  Neurological: He is alert and oriented to person, place, and time. He has normal reflexes.  Skin: Skin is warm and dry. He is not diaphoretic.       bilat arms with several areas of ecchymosis in different stages of healing, and he notes longstanding problems with arm bruising.  However, his left arm is somewhat improved compared to last visit.   Psychiatric: He has a normal mood and affect. His behavior is normal. Judgment and thought content normal.       Assessment & Plan:   Encounter Diagnoses  Name Primary?  . Dizziness Yes  . Type II or unspecified type diabetes mellitus without mention of complication, not stated as uncontrolled   . Renal insufficiency   . Anemia   . Paresthesia    Dizziness - I made some suggestions regarding his current medications, however he declines.  I recommended we consider trial of Meclizine instead of Hydroxyzine, but he really feels like Hydroxyzine helps, takes this 3-4  times daily, and refuses to stop this.  He also refuses to stop Ambien.  He did agree to not drink any alcohol for the next week or 2 until we see if the dizziness will resolve.  He gets his new glasses today which will hopefully help.  Discussed fall prevention, c/t using crutch or cane.  Call report in 3-4 days.  Diabetes type II - discussed his new diagnosis with recent HgbA1C of 6.6%.  He requests to start medication for diabetes since his recent glucose has been elevated and to see if this will help the dizziness.  We discussed his sugar numbers, medication risks/benefits, and he would like to test his glucose once daily.  We will begin Glipizide low dose of 2.mg daily.  Discussed what to do if hypoglycemic episode.  Renal insufficiency - recent BUN 35, Creatinine 1.70.  We will continue to monitor this.   Advised he continue good  hydration with water.  Anemia - anemia of chronic disease, stable        Paresthesia - discussed possible diabetic nephropathy.  Given his feet exam, nails, calluses, I advised podiatry referral despite the fact he had a prior unsatisfactory podiatry consult in remote past.  He is agreeable.   Advised that once we have the dizziness under control, then we can see him back in eval for preop clearance for upcoming knee surgery.

## 2010-10-24 ENCOUNTER — Encounter: Payer: Self-pay | Admitting: Medical

## 2010-10-24 ENCOUNTER — Telehealth: Payer: Self-pay | Admitting: *Deleted

## 2010-10-24 NOTE — Telephone Encounter (Addendum)
Message copied by Ellsworth Lennox on Mon Oct 24, 2010 12:06 PM ------      Message from: Aleen Campi, DAVID      Created: Fri Oct 21, 2010  6:03 PM       Pls refer to podiatry for diabetic foot exam, foot care, callous, toenail care.  He has seen a podiatrist before, but don't refer to that one.     Pt was scheduled with [facility] with Dr. Harriet Pho on November 16, 2010, at 3:40pm.  Pt informed of appointment.  CM, LPN

## 2010-10-25 ENCOUNTER — Telehealth: Payer: Self-pay | Admitting: Medical

## 2010-10-26 NOTE — Telephone Encounter (Signed)
Per Vincenza Hews note read and noted in chart

## 2010-10-28 NOTE — Op Note (Signed)
Zachary Vargas, Zachary Vargas               ACCOUNT NO.:  [id number]   MEDICAL RECORD NO.:  [id number]          PATIENT TYPE:  AMB   LOCATION:  ENDO                         FACILITY:  [facility]   PHYSICIAN:  John C. Madilyn Fireman, M.D.    DATE OF BIRTH:  19-Mar-1928   DATE OF PROCEDURE:  07/22/2004  DATE OF DISCHARGE:                                 OPERATIVE REPORT   INDICATIONS FOR PROCEDURE:  History of adenomatous colon polyps.   PROCEDURE:  The patient was placed in the left lateral decubitus position  and placed on the pulse monitor with continuous low-flow oxygen delivered by  nasal cannula. He was sedated with 87.5 mcg IV fentanyl and 9 milligrams IV  Versed. Olympus video colonoscope was inserted into the rectum and advanced  to cecum, confirmed by transillumination of McBurney's point and  visualization of the ileocecal valve and appendiceal orifice. The prep was  excellent. The cecum, ascending, transverse, and descending colon all  appeared normal with no masses, polyps, diverticula or other mucosal  abnormalities. Within the sigmoid colon, there was seen several scattered  diverticula.  No other abnormalities.  Within the sigmoid colon was a 6 mm  polyp that was fulgurated by hot biopsy. There was an 8 mm rectal polyp that  was removed by snare. The scope was then withdrawn, and the patient returned  to the recovery room in stable condition. She tolerated the procedure well,  and there were no immediate complications.   IMPRESSION:  1.  Sigmoid and rectal polyps.  2.  Diverticulosis.   PLAN:  Await histology to determine interval for next colonoscopy.      JCH/MEDQ  D:  07/22/2004  T:  07/22/2004  Job:  [id number]   cc:   Sharlot Gowda, M.D.  [address]  [place], [place] [address]  Fax: [phone]

## 2010-10-28 NOTE — Op Note (Signed)
Zachary Vargas. [facility]  Patient:    Zachary Vargas, Zachary Vargas                      MRN: [id number] Proc. Date: 11/22/99 Adm. Date:  [id number] Attending:  Carson Myrtle                           Operative Report  PREOPERATIVE DIAGNOSIS:  Recurrent ventral incisional hernia.  POSTOPERATIVE DIAGNOSIS:  Recurrent ventral incisional hernia.  OPERATION PERFORMED:  Exploration of wound, removal of foreign body, lysis of adhesions and repair of ventral incisional hernia recurrent with mesh.  SURGEON:  Timothy E. Earlene Plater, M.D.  ASSISTANT:  Anselm Pancoast. Zachery Dakins, M.D.  ANESTHESIA:  CRNA supervised M.D.  INDICATIONS FOR PROCEDURE:  Zachary Vargas is a very healthy 77 year old Caucasian male.  He had undergone an aortic aneurysm bypass.  Subsequently developed a hernia, this was repaired and now it has recurred again.  I had understood that the hernia repair was done without mesh as did the patient. He has a large recurrent ventral incisional hernia at the upper midportions of the long abdominal incision.  As noted, he is healthy.  He has no other medical risk factors at this time except age, weight and abdominal girth. Weight loss has been recommended.  He is now ready to proceed with surgery. This has been carefully described.  DESCRIPTION OF PROCEDURE:  The patient was brought to the operating room and placed supine.  General endotracheal anesthesia administered.  The abdomen was shaved, scrubbed, prepped and draped in the usual fashion.  A long midline incision was made through the old incision.  The superficial dissection was taken down to the fascia or hernia sac to below the umbilicus. The peritoneal cavity was entered carefully and the omental adhesions to the anterior abdominal wall were taken down sharply and bluntly.  The free abdominal cavity was explored in the upper portion of the wound and we continued dissection in the lower portion of the wound and  take-down of the omental adhesions.  A large apparent dual mesh was seen in the lower portion of the wound.  This was separated from the hernia sac, the fascia and the omentum and removed from the abdomen.  Bleeding was controlled with the cautery and the abdominal portion was dry.  Dissection both intra-abdominally and extrafascial in the subcutaneous space was accomplished to create large flaps.  The left side of the fascia was identified approximately 6 cm from the edge of the midline. The right rectus sheath was approximately 12 to 14 cm away from the midline and these two edges of fascia could not be brought together in the midline as has been the plan.  Therefore, we decided to apply a piece of Prolene mesh to the intra-abdominal portion of the fascial defect and cover the underside of the Prolene mesh with the omentum.  So the internal abdominal portion of the wound was carefully examined.  It was dry.  The omentum was pulled down over the bowel completely and it lay just on the underside of the approximated Prolene mesh.  The Prolene mesh was cut and fashioned to fit this large defect and was sewn in place with U-shaped sutures of 0 Prolene through-and-through the fascia through the mesh and through-and-through the fascia and the knots were tied out the outside of the fascia.  This was accomplished around and about the entire portion of mesh which  filled the defect in the fascia.  Again, all areas were inspected.  Additional sutures were placed where needed and the mesh was intact and lay against the omentum on the abdominal side. The hernia sac was left in place over the top of the fascia on top of the mesh and was approximated with 2-0 Vicryl sutures.  The deep subcu was closed with 2-0 mesh after instillation of two 19 mm round Blake drains which were brought out inferiorly and applied to suction.  The skin was closed with wide skin staples.  All areas were checked and were  hemostatic.  This was considered a very difficult hernia repair, a very large repair, but the results were satisfactory.  Sponge, sharp and needle counts were correct x 2.  The patient tolerated the procedure well.  Epidural anesthesia applied and he was awakened and taken to recovery room in good condition. DD:  11/22/99 TD:  11/24/99 Job: [id number]

## 2010-10-28 NOTE — H&P (Signed)
NAMEFEDERICK, Zachary Vargas                         ACCOUNT NO.:  [id number]   MEDICAL RECORD NO.:  [id number]                   PATIENT TYPE:  INP   LOCATION:  1825                                 FACILITY:  [facility]   PHYSICIAN:  Renato Battles, M.D.                  DATE OF BIRTH:  [birth year]   DATE OF ADMISSION:  11/26/2003  DATE OF DISCHARGE:                                HISTORY & PHYSICAL   REASON FOR ADMISSION:  Shortness of breath.   HISTORY OF PRESENT ILLNESS:  The patient is a very pleasant 77 year old  white male who has been experiencing progressively worse shortness of breath  for the last 24 hours associated with nagging productive cough, difficulty  clearing his throat and persistent fever.  He also reports having pleuritic  right anterior chest pain.  He had one episode of chills.  He presented to  his primary care physician, Dr. Susann Givens, today and work-up by Dr. Susann Givens  showed that pO2 was decreased at 68, pCO2 was decreased at 33 and chest x-  ray showed pneumonia.   ALLERGIES:  MORPHINE causes hives.   MEDICATIONS:  1. Zocor 20 mg p.o. q.h.s.  2. Paxil 25 mg p.o. daily.  3. Xalatan eye drops.  4. Cosopt eye drops.  5. Aspirin daily.  6. Multivitamin daily.   REVIEW OF SYMPTOMS:  CONSTITUTIONAL:  Positive for chills and fever.  Positive for gradual increase in weight the last few months.  GI:  No nausea  or vomiting, no diarrhea or constipation.  CARDIOPULMONARY:  Positive for  pleuritic chest pain and shortness of breath.  Positive for cough.  No  orthopnea, no PND.  GU:  No dysuria, hematuria or retention.   PAST MEDICAL HISTORY:  1. Depression.  2. Glaucoma.  3. Hypercholesterolemia.   PAST SURGICAL HISTORY:  Repair of abdominal aortic aneurysm five years ago.   FAMILY HISTORY:  Noncontributory.   SOCIAL HISTORY:  No tobacco.  The patient drinks two to three vodka drinks a  day.  No drugs.  He lives with his wife.  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   PHYSICAL EXAMINATION:  GENERAL APPEARANCE:  Alert and oriented x3.  Mild  distress.  VITAL SIGNS:  The patient was febrile, otherwise vital signs were within  normal limits.  He was saturating 97% on three liters oxygen.  HEENT:  Head is atraumatic and normocephalic.  Pupils are equal, round,  reactive to light and accommodation.  NECK:  No lymphadenopathy.  No thyromegaly.  No JVD.  CHEST:  The patient has loud upper airway noises which mask any crackles.  However, he has clear right lower lung consolidation.  CARDIOVASCULAR:  Regular rhythm, tachycardia.  ABDOMEN:  Soft, nontender and nondistended.  Positive bowel sounds.  EXTREMITIES:  No cyanosis or clubbing.   LABORATORY DATA:  CBC showed elevated white count  at 14.7 with 91%  neutrophils, hemoglobin normal at 12.2, normal platelets 135.  ABG showed pH  of 7.41, pCO2 of 33, pO2 of 68.   Chest x-ray showed right upper lobe and superior segment of right lower lobe  pneumonia.   ASSESSMENT/PLAN:  1. Shortness of breath most likely secondary to community acquired     pneumonia.  The patient will be started on Rocephin and Azithromycin IV.     For symptomatic relief of shortness of breath, he will be receiving     albuterol and Atrovent nebulizer as well as Robitussin.  2. Depression.  I will increase the dose of Paxil the patient is taking and     given him Elavil one q.h.s. to help with sleep and help with depression.  3. Significant weight gain recently.  The wife attributes it to depression.     I am going to check TSH to rule out any hypothyroidism.  4. Glaucoma.  Continue Xalatan and Cosopt.  5. Significant alcohol intake.  No other indications of alcoholism, however,     the way the patient and his wife were taking about it, it sounds like it     has been a problem before.  I am going to give  him thiamine and folate     and check liver functions for now.  6. Hypercholesterolemia.  Continue  Zocor.  Chest fasting lipids and see if     he needs any change in the dose.                                                Renato Battles, M.D.    SA/MEDQ  D:  11/26/2003  T:  11/26/2003  Job:  [id number]   cc:   Sharlot Gowda, M.D.  [address]  [place], [place] [address]  Fax: [phone]

## 2010-10-28 NOTE — Progress Notes (Signed)
[facility]                          PERIPHERAL VASCULAR OFFICE NOTE   NAME:Zachary Vargas, Zachary Vargas                      MRN:          [id number]  DATE:05/07/2006                            DOB:          Jan 18, 1928    REASON FOR CONSULTATION:  Patient referred by Dr. Susann Givens for claudication  and possible renal artery stenosis.   Zachary Vargas is a 77 year old gentleman who underwent aortobifemoral bypass  grafting for repair of an abdominal aortic aneurysm in 1990 by Dr. Hart Rochester.  Since that time, he Vargas been troubled by predominantly right leg discomfort  with walking.  Over the past year it Vargas become more bilateral and he feels  his exercise threshold Vargas decreased somewhat.  He says that the entirety of  both legs becomes tired with walking.  This Vargas kept him from playing golf.  He finds this quite frustrating.  In addition, he Vargas had a gout flare over  the past year in his right foot and Vargas had what he describes as an Achilles  problem in his right leg.  Despite this, he continues to walk his dog for 30  minutes a day.  He says his legs are quite sore when he finishes this.  However, they do improve fairly rapidly with rest.   Additionally, Zachary Vargas renal function Vargas deteriorated from a  creatinine of 1.1 on August 13th of this year to 1.8 on October 15th of this  year.  Renal ultrasound performed on October 25th showed preservation of  renal sizes.  Zachary Vargas tells me that the only change in his medications  between those two time points was his decision to take Indocin on a daily  basis rather than the occasional basis that he had done previously.   PAST MEDICAL HISTORY:  1. Abdominal aortic aneurysm repair in 1990 by Dr. Hart Rochester.  2. Right upper lobe pneumonia in 2005.  3. Prior tonsillectomy.  4. Hypothyroidism.  5. Gout.  6. Depression.  7. Glaucoma.  8. Hypercholesterolemia.  9. Hypertension.  10.History of nephrolithiasis.   CURRENT MEDICATIONS:  1. Cosopt eye drops.  2. Xalatan eye drops.  3. Alprazolam 0.25 mg p.r.n.  4. Crestor 40 mg p.r.n.  5. Paxil 20 mg per day.  6. Synthroid 25 mcg per day.  7. Lisinopril/HCTZ 10/25 one per day (patient says this Vargas been a stable      dose for about 2 years).  8. Allopurinol 300 mg per day.  9. Indocin 75 mg daily.  10.Aspirin 81 mg per day.  11.Multivitamin.  12.Iron.  13.Glucosamine.   ALLERGIES:  MORPHINE.   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  He enjoys walking and is  frustrated that he Vargas not been able to do that lately.  He is married with  3 grown children.  He previously smoked but quit in 1989.   FAMILY HISTORY:  Father died at 46 of Alzheimer's.  His mother was murdered  at 52.  Three brothers are all  deceased, one of dementia, one of colon  cancer and one of lung cancer.  His 3 children are all alive and well.   REVIEW OF SYSTEMS:  Wears glasses, upper dentures, occasional exertional  dyspnea.  Vargas chronic knee pain and allergic rhinitis.  Review of systems  otherwise negative in detail except as above.   PHYSICAL EXAMINATION:  He is overweight but otherwise well appearing, in no  distress.  Heart rate 76, blood pressure 130/68 on the right and 128/70 on  the left.  He is 6 feet 2 inches tall and weighs 275 pounds.  Skin exam is  normal.  HEENT:  Normal.  MUSCULOSKELETAL:  Normal.  He Vargas no jugular venous distention or thyromegaly.  There is no  lymphadenopathy.  Respiratory effort is normal.  LUNGS:  Clear to auscultation.  He Vargas a nondisplaced point of maximum cardiac impulse.  There is a regular  rate and rhythm without murmur, rub, or gallop.  ABDOMEN:  Soft, nondistended, nontender.  There is no hepatosplenomegaly.  Bowel sounds are normal.  There is no abdominal bruit.  There is a scar  consistent with prior aortobifemoral bypass grafting.   EXTREMITIES:  Warm without clubbing, cyanosis, edema, or ulceration.  Carotid pulses are 2+ bilaterally without bruit.  Femoral pulses 2+  bilaterally with a prominent bruit on the left.  Popliteal, DP, and PT  pulses are 2+ bilaterally.  He is alert and oriented x3 with normal affect.   IMPRESSION RECOMMENDATIONS:  1. Renal dysfunction:  The patient clearly does have substantial      atherosclerotic disease and may well have renal artery stenosis.      However, his deterioration in renal function Vargas come with the      initiation of daily Indocin.  I think this is the most likely culprit.      I have asked him to discontinue his Indocin and follow up within the      next few weeks with Dr. Susann Givens for recheck of his renal function.      Since his blood pressure is well controlled, I would not pursue any      further evaluation for renal artery stenosis if his renal function      normalizes.  2. Leg pain:  The ankle to brachial indices are normal at rest.      Immediately post exercise dropped mildly to 0.75 and normalized again      at 2 minutes post exercise.  This signifies very mild arterial      insufficiency and is not clearly outside the normal range.  I think      that his symptoms are disproportionate to this level of abnormality.      In either case, he appears to have no significant disease below the      level of his graft.  Will therefore manage him conservatively.  I did      initiate some Pletal to see if we can have some benefit.   I will plan on seeing him back in approximately 6 weeks' time to assess his  response to this therapy.     Salvadore Farber, MD  Electronically Signed    WED/MedQ  DD: 05/07/2006  DT: 05/07/2006  Job #: [id number]   cc:   Sharlot Gowda, M.D.

## 2010-10-28 NOTE — Discharge Summary (Signed)
Zachary Vargas, Zachary Vargas               ACCOUNT NO.:  [id number]   MEDICAL RECORD NO.:  [id number]          PATIENT TYPE:  IPS   LOCATION:  0300                          FACILITY:  [facility]   PHYSICIAN:  Jasmine Pang, M.D. DATE OF BIRTH:  Jul 16, 1927   DATE OF ADMISSION:  09/17/2006  DATE OF DISCHARGE:  09/19/2006                               DISCHARGE SUMMARY   IDENTIFICATION:  A 77 year old married white male who was admitted on a  voluntary basis on September 17, 2006.   HISTORY OF PRESENT ILLNESS:  The patient reports increased drinking  since retirement in 2001.  He states he mixes alcohol with prescription  drugs.  He has been drinking approximately 4 large glasses of vodka  daily for the past 2 years.  He has frequent falls. His family has  confronted him about his use and his falls.  His wife states she has to  call EMS every other day.  The patient has a very itchy rash which and  reports that drinking has helped him sleep with the rash.  The patient  had had passive suicidal ideation for the past 4-5 years but no intent.  His wife reported he had stated he wanted to kill himself, but he denies  this.  This is the first [facility] admission for this patient.  He has not  gotten psychiatric or detox or rehab treatment in the past.  The patient  is a nonsmoker.  He has a history of PAD, renal dysfunction and a  generalized rash questionably related to allopurinol.  He is ALLERGIC TO  MAGNESIUM SULFATE.  He is on multiple medications which will be listed  under hospital course.   PHYSICAL FINDINGS:  A complete physical exam was within normal limits.  The patient was in no acute distress though he had to walk with a cane  due to unsteady gait.   ADMISSION LABORATORIES:  BUN was 54, creatinine 1.59.  UDS positive for  benzodiazepines.  His alcohol level was 59.  The other labs were done in  the ED and were evaluated by the ED physician.   HOSPITAL COURSE:  Upon admission, the patient was  started on Benadryl 25  mg p.o. q.4 h. p.r.n. itching.  He was restarted on his home medications  of Xanax 0.25 mg p.o. p.r.n. daily for anxiety, Paxil 20 mg p.o.  nightly, Synthroid 25 mcg p.o. daily, lisinopril/hydrochlorothiazide  20/25 mg p.o. daily, Lipitor 40 mg p.o. daily, allopurinol 300 mg p.o.  daily (this was inadvertently restarted; I was not aware he had been off  this medication for the past 2 weeks), One-A-Day vitamins p.o. daily,  aspirin 81 mg p.o. daily, glucosamine 500 mg p.o. t.i.d., Cosopt  eyedrops in the a.m. and p.m., 1 drop to both eyes, Xalatan eyedrops 2.5  mL, 1 drop to right eye daily, Sarna anti-itch cream topical apply 3-4  times daily as needed, triamcinolone acetonide cream apply to affected  areas b.i.d.  On September 18, 2006, the patient was started on a Librium  detox protocol.  On September 18, 2006, Benadryl was discontinued due  to  concerns about unsteadiness on his feet.  Xanax was discontinued.  The  lisinopril/ hydrochlorothiazide was discontinued.  On September 18, 2006, an  order was written to hold the Librium if the patient was oversedated.  The patient was started on Claritin 10 mg p.o. p.r.n. itching. Phenergan  and on the detox protocol was decreased to 12.5 mg instead of 25 mg  p.r.n.  On September 18, 2006, the glucosamine and chondroitin was  discontinued during hospitalization since it was an herbal medication.  On September 18, 2006, the patient was allowed to use his own triamcinolone  cream and Sarna cream as directed.  On September 19, 2006, allopurinol was  discontinued.  He had not realized he was receiving that again, and his  rash worsened markedly. On September 19, 2006, an order was written to give  Librium 25 mg at 4:00 p.m. before discharge, and the patient tolerated  these medications well with no significant side effects.  On first  meeting the patient on September 18, 2006, he stated he began to drink  because it helped him relax. He was currently drinking up  to 4 large  glasses of vodka per day.  He stated this helped him tolerate his rash  better. Yesterday a.m. he decided to come into the hospital because he  was confronted by his family. He almost fell and injured himself and has  been falling a lot.  His sleep had been poor prior to admission. The  patient was placed on a Librium detox protocol.   On September 19, 2006, the patient's rash had been exacerbated by the  inadvertent restarting of allopurinol. He originally had said he did not  know the origin of the rash but later told us that he thought it was  allopurinol causing it.  He became very uncomfortable and needed to get  back to his dermatologist, Dr. Caffeine, as quickly as possible.  He  stated he could get in to see him early in the morning if discharged.  He requested discharge.  The patient's mental status was stable.  He was  friendly, conversant with good eye contact.  Speech normal rate and  flow.  Psychomotor activity was somewhat slow due to his unsteady gait.  Mood was euthymic except for some anxiety about the rash.  Affect was  wide range.  No suicidal or homicidal ideation.  No thoughts of self-  injurious behavior.  No auditory or visual hallucinations.  No paranoia  or delusions.  Thoughts were logical and goal directed.  Thought content  with no predominant theme.  Cognitive was grossly back to baseline.  It  was felt that the patient could return home and complete the rest of the  detox protocol for the next 2 days at home so that he could get back to  his dermatologist given the severity of his rash.   DISCHARGE DIAGNOSES:  AXIS I: Alcohol dependence.  AXIS II: None.  AXIS III: PAD, renal dysfunction, severe total body rash.  AXIS IV: Moderate (problems with primary support group, other  psychosocial problems related to his drinking, medical problems).  AXIS V: GAF upon discharge was 55.  GAF upon admission was 40.  GAF highest past year was 70-75.    DISCHARGE/PLAN:  There were no specific dietary restrictions.  The  patient's activity level was limited by his need to walk with  assistance.   DISCHARGE MEDICATIONS:  1. Paxil 20 mg p.o. daily.  2. Levothyroxine 25 mcg  at 6:00 p.m.  3. Multivitamin with minerals daily.  4. Xalatan 0.005%, 1 drop as prescribed daily.  5. Cosopt ophthalmic solution, 1 drop twice daily as prescribed.  6. Aspirin 81-mg tablet daily.  7. Triamcinolone cream twice daily to affected areas.  8. Lipitor 40 mg daily.  9. Sarna anti-itch lotion applied 3 times daily as needed.  10.Ambien 5 mg p.o. nightly p.r.n. insomnia.  This had been started in      the hospital and had been found to be helpful for his insomnia.  11.Librium 25 mg, 1 pill twice daily on September 20, 2006, then 1 pill in      the a.m. on September 21, 2006, then discontinue and the detox would be      complete.   POST HOSPITAL CARE PLANS:  We recommended further outpatient treatment  for chemical dependence.  The patient was somewhat reluctant and seemed  to feel he could do this on his own.  It is unclear whether he will  follow up with the suggestions of our case manager or not.  Case  management is arranging followup for the patient at this time, but it  has not been documented yet.     Jasmine Pang, M.D.  Electronically Signed    BHS/MEDQ  D:  09/19/2006  T:  09/19/2006  Job:  [id number]

## 2010-10-28 NOTE — Discharge Summary (Signed)
[place]  Patient:    Zachary Vargas, Zachary Vargas                      MRN: [id number] Adm. Date:  [id number] Disc. Date: [id number] Attending:  Carson Myrtle                           Discharge Summary  FINAL DIAGNOSIS:  Recurrent ventral incisional hernia.  HISTORY OF PRESENT ILLNESS:  The patient was prepared as an outpatient, brought into the hospital for repair of ventral hernia with mesh.  His chest x-ray was negative.  Previous cardiogram was satisfactory.  Laboratory data was acceptable.  HOSPITAL COURSE:  He underwent general anesthesia and repair of recurrent ventral incisional hernia with mesh.  He had a smooth three-day postoperative recovery during which time he was progressively ambulated.  Diet was increased.  GI function returned, and he had no urinary problems.  The wound remained primary.  The drains remained in.  Pain is controlled.  He is to be seen and followed as an outpatient next week.  Percocet #40 given.  He will be followed in the office. DD:  11/25/99 TD:  11/29/99 Job: [id number]

## 2010-10-28 NOTE — Discharge Summary (Signed)
NAMEADYEN, Zachary Vargas                         ACCOUNT NO.:  [id number]   MEDICAL RECORD NO.:  [id number]                   PATIENT TYPE:  INP   LOCATION:  5157                                 FACILITY:  [facility]   PHYSICIAN:  Renato Battles, M.D.                  DATE OF BIRTH:  [birth year]   DATE OF ADMISSION:  11/26/2003  DATE OF DISCHARGE:  11/28/2003                                 DISCHARGE SUMMARY   DISCHARGE DIAGNOSES:  1. Right upper lobe pneumonia.  2. Alcohol withdrawal.  3. Hypoalbuminemia.  4. Hypothyroidism.  5. Thrombocytopenia, resolved.  6. Acute renal failure secondary to dehydration, resolved.  7. Depression.  8. Hyperglycemia currently untreated.  9. Glaucoma.  10.      Hypercholesterolemia.   DISCHARGE MEDICATIONS:  1. Zocor 20 mg p.o. daily.  2. Xalatan and Cosopt eye drops for glaucoma.  3. Paxil 25 mg p.o. daily.  4. Synthroid 25 mcg p.o. daily.  5. Augmentin 875 mg p.o. b.i.d. x7 days.  6. Librium 25 mg two doses to complete the alcohol withdrawal protocol.  7. Aspirin daily.   PROCEDURE:  None.   CONSULTATIONS:  None.   HISTORY OF PRESENT ILLNESS:  The patient is a very pleasant 77 year old  white male who presented to his primary care physician's office complaining  of shortness of breath and nagging productive cough with difficulty clearing  the throat and persistent fever.  Dr. Susann Givens, who was the primary care  physician, did chest x-ray and found out that the patient has right lung  infiltrate.  He is admitted to the hospital directly for further management.  We found that the patient had fever of 101.  Blood pressure was normal.  He  was tachycardic so the patient was started on Rocephin and Azithromycin.  The patient gives history of early significant alcohol intake so thiamine,  folate and alcohol withdrawal regimen were started. Work-up revealed the  patient had low platelets which was probably the result of alcohol and this  had resolved.   Also he has no albumin.  On the side notes, the patient  reported  persistent increase in weight on review of systems; so  hypothyroidism was suspected and was confirmed by elevated TSH and the  patient was started on Synthroid , low dose.   During his hospitalization, the patient had no significant complications.  He was noted to have elevated blood sugar persistently and probably  compatible with the new diagnosis of diabetes; however, given the new  diagnosis of hypothyroidism and initiation of Synthroid, I elected to wait  until the patient is euthyroid before giving the diagnosis of diabetes.  This issue is to be followed by his primary care physician.   DISCHARGE DIET:  Low fat.   ACTIVITY:  As tolerated.   DISCHARGE INSTRUCTIONS:  The patient is to follow up with his primary care  physician, Sharlot Gowda, M.D., within  seven days of discharge.  Complete  antibiotic x7 days and request evaluation for diabetes in the next couple of  weeks.                                                Renato Battles, M.D.    SA/MEDQ  D:  11/28/2003  T:  11/30/2003  Job:  [id number]   cc:   Sharlot Gowda, M.D.  [address]  [place], [place] [address]  Fax: [phone]

## 2010-10-28 NOTE — Progress Notes (Signed)
[facility]                        PERIPHERAL VASCULAR OFFICE NOTE   NAME:Zachary Vargas, Zachary Vargas                      MRN:          [id number]  DATE:06/20/2006                            DOB:          [birth year]    PRIMARY CARE PHYSICIAN:  Zachary Vargas, M.D.   HISTORY OF PRESENT ILLNESS:  Zachary Vargas is a 77 year old gentleman with  prior aortobifemoral bypass grafting for an abdominal aortic aneurysm.  I met him in late November, 2007 for complaints of leg pain.  I felt  this was most likely due to sciatica rather than claudication.  He had a  spinal injection by Zachary Vargas five days ago.  With that, his leg pain  has completely resolved.  This clearly demonstrates that he has no  claudication.   In addition, Zachary Vargas has deterioration of his renal function with a  creatinine of 1.1 on January 22, 2006 to 1.8 on January 24, 2006.  Renal  ultrasound showed preservation of kidney sizes.  The only change in  medication between those two time points was the initiation of daily  Indocin.  When I saw him in late November, I asked him to stop this and  have a recheck creatinine done at Zachary Vargas office.  Patient failed  to follow up with Zachary Vargas.  He tells me he is now scheduled to see a  nephrologist in the near future.  He thinks this is Zachary Vargas.   His current medications are Cosopt eye drops, Xalatan eye drops, Crestor  40 mg per day, Paxil 20 mg a day, Synthroid 25 mcg per day,  lisinopril/HCTZ 10/25 1 per day, Allopurinol 300 mg per day, aspirin 81  mg per day, multivitamin, iron, glucosamine.   PHYSICAL EXAMINATION:  GENERAL:  He is overweight but generally well-  appearing in no distress.  VITAL SIGNS:  Heart rate 95, blood pressure 132/74 and equal  bilaterally.  Weight is 275 pounds.  NECK:  He has no jugular venous distention, thyromegaly, or  lymphadenopathy.  LUNGS:  Clear to auscultation.  HEART:  He has a nondisplaced point of maximal  cardiac impulse.  There  is a regular rate and rhythm without murmur, rub or gallop.  ABDOMEN:  Obese, soft, nontender, nondistended.  There is no  hepatosplenomegaly.  No pulsatile midline mass.  There is no abdominal  bruit.  EXTREMITIES:  Warm without clubbing, cyanosis, edema, or ulceration.  Carotid pulses 2+ bilaterally without bruit.  NEUROLOGIC:  He is alert and oriented x3 with normal affect.   IMPRESSION/RECOMMENDATIONS:  1. Leg pain:  Clearly due to sciatica, now resolved after spinal      injection.  2. Peripheral arterial disease, status post aortobifemoral bypass      grafting in 1990 for abdominal aortic aneurysm.  ABIs are normal.  3. Renal dysfunction:  Will check BMET today.  If creatinine remains      abnormal, will check renal duplex and provide both of these results      to my nephrology colleagues as well as Dr.      Susann Vargas.  Continue ACE inhibitor for  his renal insufficiency while      this assessment is going on.     Zachary Farber, MD  Electronically Signed    WED/MedQ  DD: 06/20/2006  DT: 06/20/2006  Job #: [id number]   cc:   Zachary Vargas, M.D.  Zachary Vargas, M.D.

## 2010-12-08 ENCOUNTER — Telehealth: Payer: Self-pay | Admitting: Family Medicine

## 2010-12-08 NOTE — Telephone Encounter (Signed)
See when his last refill on the two meds were and let me know

## 2010-12-09 ENCOUNTER — Other Ambulatory Visit: Payer: Self-pay | Admitting: Family Medicine

## 2010-12-09 DIAGNOSIS — G4700 Insomnia, unspecified: Secondary | ICD-10-CM

## 2010-12-09 MED ORDER — ALPRAZOLAM 0.25 MG PO TABS: 0.2500 mg | ORAL_TABLET | Freq: Three times a day (TID) | ORAL | Status: DC | PRN

## 2010-12-09 MED ORDER — ZOLPIDEM TARTRATE 10 MG PO TABS: 10.0000 mg | ORAL_TABLET | Freq: Every evening | ORAL | Status: DC | PRN

## 2010-12-09 NOTE — Telephone Encounter (Signed)
rx called in to [facility] [place] ambien 10 qhs #30 no refill and xanax .25 #30 prn per jcl kds

## 2010-12-09 NOTE — Telephone Encounter (Signed)
Thanks renew both of them

## 2010-12-09 NOTE — Telephone Encounter (Signed)
ambien last refilled 01/31/2010 and xanax refilled 06/23/2010

## 2010-12-26 ENCOUNTER — Encounter: Payer: Self-pay | Admitting: Medical

## 2010-12-26 ENCOUNTER — Ambulatory Visit (INDEPENDENT_AMBULATORY_CARE_PROVIDER_SITE_OTHER): Payer: Medicare Other | Admitting: Medical

## 2010-12-26 ENCOUNTER — Ambulatory Visit
Admission: RE | Admit: 2010-12-26 | Discharge: 2010-12-26 | Disposition: A | Discharge status: Home / Self Care | Payer: Medicare Other | Source: Ambulatory Visit | Attending: Medical | Admitting: Medical

## 2010-12-26 VITALS — BP 122/60 | HR 68 | Ht 75.0 in

## 2010-12-26 DIAGNOSIS — S40812A Abrasion of left upper arm, initial encounter: Secondary | ICD-10-CM

## 2010-12-26 DIAGNOSIS — M79603 Pain in arm, unspecified: Secondary | ICD-10-CM

## 2010-12-26 DIAGNOSIS — N289 Disorder of kidney and ureter, unspecified: Secondary | ICD-10-CM

## 2010-12-26 DIAGNOSIS — R0781 Pleurodynia: Secondary | ICD-10-CM

## 2010-12-26 DIAGNOSIS — W19XXXA Unspecified fall, initial encounter: Secondary | ICD-10-CM

## 2010-12-26 DIAGNOSIS — IMO0002 Reserved for concepts with insufficient information to code with codable children: Secondary | ICD-10-CM

## 2010-12-26 DIAGNOSIS — R0789 Other chest pain: Secondary | ICD-10-CM

## 2010-12-26 DIAGNOSIS — D649 Anemia, unspecified: Secondary | ICD-10-CM

## 2010-12-26 DIAGNOSIS — M79609 Pain in unspecified limb: Secondary | ICD-10-CM

## 2010-12-26 DIAGNOSIS — R079 Chest pain, unspecified: Secondary | ICD-10-CM

## 2010-12-26 LAB — CBC WITH DIFFERENTIAL/PLATELET
Basophils Absolute: 0 10*3/uL (ref 0.0–0.1)
Basophils Relative: 0 % (ref 0–1)
Eosinophils Absolute: 0.5 10*3/uL (ref 0.0–0.7)
Hemoglobin: 10.3 g/dL — ABNORMAL LOW (ref 13.0–17.0)
MCH: 31.8 pg (ref 26.0–34.0)
MCHC: 32.3 g/dL (ref 30.0–36.0)
Neutro Abs: 5.6 10*3/uL (ref 1.7–7.7)
Neutrophils Relative %: 69 % (ref 43–77)
Platelets: 180 10*3/uL (ref 150–400)
RDW: 14 % (ref 11.5–15.5)

## 2010-12-26 LAB — COMPREHENSIVE METABOLIC PANEL
AST: 15 U/L (ref 0–37)
Albumin: 3.7 g/dL (ref 3.5–5.2)
Alkaline Phosphatase: 80 U/L (ref 39–117)
Glucose, Bld: 130 mg/dL — ABNORMAL HIGH (ref 70–99)
Potassium: 4.3 mEq/L (ref 3.5–5.3)
Sodium: 136 mEq/L (ref 135–145)
Total Bilirubin: 0.5 mg/dL (ref 0.3–1.2)
Total Protein: 6.2 g/dL (ref 6.0–8.3)

## 2010-12-26 MED ORDER — MUPIROCIN 2 % EX OINT: TOPICAL_OINTMENT | CUTANEOUS | Status: DC

## 2010-12-26 MED ORDER — CEPHALEXIN 500 MG PO CAPS: ORAL_CAPSULE | ORAL | Status: DC

## 2010-12-26 NOTE — Patient Instructions (Signed)
Use Miralax and stool softener for constipation.    You can use ice pack to the ribs.  Take deep breaths regularly to help air flow in the lungs.    Use your pain medication or OTC Tylenol as needed for pain.   Keep your left arm wound clean and change the bandage daily.  Use Mupirocin ointment on the wound.    We will have you go for xrays and labs were done today to recheck kidney and anemia labs.

## 2010-12-26 NOTE — Progress Notes (Signed)
Subjective:   Here today for c/o fall.  He fell last week in his house.   He had just gotten back from walking at the [employer] park, and says his tennis shoe caught on the throw rug, and he ended up falling into a curio cabinet and subsequently fell to the ground.  He ended up cutting his left upper arm, bruised the arm, but the worst part was that his ribs hit against the cabinet.  He notes lots of rib pain on the right.  He is using pain meds he had left over from prior but this constipates him.  He is using stool softener and Miralax, but this sometimes doesn't help.  He is using bandage over the abrasion, but its hard for him to change this and the adhesive rips the skin at times.   He notes numerous falls in the last few years.  Sometimes its from tripping up his feet, sometimes he gets off balance and dizzy.  He does use walker or cane regularly.    Since his recent diagnosis of diabetes, he has been out at the [employer] park walking.  He has been feeling good about this.  He started walking 10 minutes at a time, but progressed up to 35 minute walks.  His knees actually feel better after he walks.    He lives with his wife, but their relationship is not the best.  He notes that she couldn't make it on her own, but they tolerate each other.    He was going to be having ortho surgery for the knees, but this has been canceled for now as he has so much going on.   The following portions of the patient's history were reviewed and updated as appropriate: allergies, current medications, past family history, past medical history, past social history, past surgical history and problem list.  Past Medical History  Diagnosis Date  . Glaucoma   . Diabetes mellitus   . Anemia     of chronic disease  . History of aortic aneurysm repair   . Hypothyroidism   . History of renal stone   . Dyslipidemia     statin intolerant  . Colon polyps   . Melanoma in situ     History of  . Gout   . Allergy   .  BPH (benign prostatic hyperplasia)     [facility]  . History of peptic ulcer disease   . Peptic ulcer disease   . Diverticulosis   . Lumbar spinal stenosis     Most recent MRI 12/ 2011   . Pulmonary disease     Fibrotic parenchymal changes per 5/11 x-ray  . Carotid atherosclerosis     Dopplers 11/09 showing 50-69% stenosis  . Chronic kidney disease     2/08 renal artery ultrasound without significant stenosis; followed by [facility]  . Peripheral arterial disease     [place] [facility] consult 1/08  . Epigastric pain     Hospitalization 7/11 [place]  . Arthritis     [facility]  . Myeloma     IgA related; Dr. Myna Hidalgo   Past Surgical History  Procedure Date  . Abdominal aortic aneurysm repair 03/1998  . Abdominal hernia repair   . Renal stone surgery 1999  . Nerve root block     Multiple for lumbar spine     Review of Systems Gen: no fever, chills, wt changes Skin: bruising, left upper arm abrasion ENT: no sore throat,  congestion, ear pain Lungs: hurts to take deep breaths at times, but no hemoptysis, SOB Heart: no chest pain, palpitations Abdominal: non tender, no N/V/D;  +constipation, no blood in stool. GU: no dysuria or hematuria Neuro: occasional tenderness, no headache, no head injury, no LOC, no vision or hearing changes.     Objective:   Physical Exam  General appearance: alert, no distress, WD/WN, white male, seated with crutches Skin: right upper arm with diffuse purplish ecchymosis, left upper lateral arm with large 10cm x 6cm abrasion, right lower leg with some generalized ecchymosis, no ecchymosis of chest wall, old bruising of dorsal hands, otherwise skin unremarkable Neck: nontender, no masses, no thyromegaly, no JVD or bruit Heart: RRR, no murmurs, normal S1, S2 Chest: tender along right chest anterior and somewhat anterolateral Lungs: decreased breath sounds, faint wheeze Abdomen: nontender Ext: tender along left  upper arm around the area of the bruising, mildly reduced ROM of elbow on the left, otherwise nontender arms and legs, with relatively normal ROM, nontender otherwise, +arthritic changes of both hands and fingers at MCPs and PIPs Pulses: 2+ symmetrical Neuro: alert and oriented x 3, CN2-12 intact, strength normal UE and LE    Assessment :    Encounter Diagnoses  Name Primary?  . Fall Yes  . Rib pain   . Arm pain   . Abrasion of arm, left   . Anemia   . Renal insufficiency      Plan:   I spent considerable time today reviewing his complex medical history and chart. Greater than one hour spent reviewing prior records.    Fall-he has had multiple falls despite using walker and cane.  We discussed his medications, and he denies feeling somnolence in the morning after being on Ambien. I had seen him for dizziness a few weeks ago, and he still gets this occasionally.  The reason for his falls is complicated.   He takes Ambien, he does drink alcohol (quantity questionable), he has a history of spinal stenosis in the lumbar spine, significant lower extremity arthritis, peripheral vascular disease, and some of his dizziness may just be age related changes.  Pending labs, I may have him recheck with cardiology and/or neurology.  Looking back through records, he has been seen multiple times through the emergency dept for falls, and has xray proof of prior clavicle and rib fractures with falls.  Rib pain- ice pack prn, use pain meds he has at home prn, discussed incentive spirometry  Arm pain- xray today.  Advised rest, avoid re injury  Abrasion left arm-nurse cleaned wound today, covered with mupirocin and new nonstick bandage and dressing.  Discussed proper wound care, advised to use similar dressing once to twice a day as we applied today, as this will help avoid tearing the skin while keeping wound clean.  Anemia-recheck labs today  Renal insufficiency-recheck labs today.   His renal function  has been relatively stable, and his creatinine has been over 2 in the past. He last nephrology note in the chart is from 11/09 through [place].  The etiology of his kidney disease was questionable at that time.  At this point, given his long-standing hypertension , recent new diagnosis of diabetes although he had been glucose intolerant, and his vascular disease in general, I assume his kidney disease etiology is multifactorial.   Depression-we discussed his home situation, although it's not the best situation, he feels as though him and his wife tolerate each other. He is taking Paxil  with some benefit.

## 2010-12-27 ENCOUNTER — Telehealth: Payer: Self-pay | Admitting: *Deleted

## 2010-12-27 ENCOUNTER — Encounter: Payer: Self-pay | Admitting: Medical

## 2010-12-27 NOTE — Telephone Encounter (Addendum)
Message copied by Dorthula Perfect on Tue Dec 27, 2010  4:14 PM ------      Message from: Jac Canavan      Created: Tue Dec 27, 2010  1:56 PM       I spoke to pt about his labs and xray findings.  Of note, he still drinks at least 3oz of liquor daily.  He says that given his relationship with his wife which is not the best, he'd rather "put a gun to his head" if he had to give up alcohol.  He says he needs this to tolerate living with his wife.   I spoke to him about his alcohol consumption, his frequent falls, and I advised that I thoroughly reviewed his chart and prior records.  He was suppose to have had a cardiac stress test last July after his hospitalization.  He apparently canceled this on his own.  At this point given his frequent falls, dizziness, and symptoms suggestive of syncope, I want to refer him to cardiology.              Lets refer back to [facility] for possible syncope and dizziness, frequent falls, hx/o heart disease and peripheral vascular disease.  Send most recent OV note, last 2 set of labs (those below and those in the paper chart).  Send copies of prior carotid doppler studies, ABIs, renal artery ultrasound records as well.  Spoke with pt regarding appointment with [facility].  Faxed over office note, labs, carotid doppler studies, ABIs and renal artery ultrasound to [facility] for an appointment.  CM, LPN

## 2010-12-30 ENCOUNTER — Telehealth: Payer: Self-pay | Admitting: Medical

## 2010-12-30 NOTE — Telephone Encounter (Signed)
Advised pt of Zachary Vargas's note. He understood.

## 2010-12-30 NOTE — Telephone Encounter (Signed)
His 9th and 10th ribs showed healing callus around these 2 areas.  Thus, 2 likely old fractures, healing.

## 2011-01-05 ENCOUNTER — Other Ambulatory Visit: Payer: Self-pay | Admitting: Family Medicine

## 2011-01-05 DIAGNOSIS — G4700 Insomnia, unspecified: Secondary | ICD-10-CM

## 2011-01-05 MED ORDER — ZOLPIDEM TARTRATE 10 MG PO TABS: 10.0000 mg | ORAL_TABLET | Freq: Every evening | ORAL | Status: DC | PRN

## 2011-01-05 NOTE — Telephone Encounter (Signed)
Renew the Ambien for 90 days and give him a refill

## 2011-01-09 ENCOUNTER — Other Ambulatory Visit: Payer: Self-pay | Admitting: *Deleted

## 2011-02-10 ENCOUNTER — Emergency Department (HOSPITAL_COMMUNITY)
Admission: EM | Admit: 2011-02-10 | Discharge: 2011-02-10 | Disposition: A | Discharge status: Home / Self Care | Payer: Medicare Other | Attending: Emergency Medicine | Admitting: Emergency Medicine

## 2011-02-10 DIAGNOSIS — W1809XA Striking against other object with subsequent fall, initial encounter: Secondary | ICD-10-CM | POA: Insufficient documentation

## 2011-02-10 DIAGNOSIS — F329 Major depressive disorder, single episode, unspecified: Secondary | ICD-10-CM | POA: Insufficient documentation

## 2011-02-10 DIAGNOSIS — I10 Essential (primary) hypertension: Secondary | ICD-10-CM | POA: Insufficient documentation

## 2011-02-10 DIAGNOSIS — S51809A Unspecified open wound of unspecified forearm, initial encounter: Secondary | ICD-10-CM | POA: Insufficient documentation

## 2011-02-10 DIAGNOSIS — E039 Hypothyroidism, unspecified: Secondary | ICD-10-CM | POA: Insufficient documentation

## 2011-02-10 DIAGNOSIS — S41109A Unspecified open wound of unspecified upper arm, initial encounter: Secondary | ICD-10-CM | POA: Insufficient documentation

## 2011-02-10 DIAGNOSIS — Y92009 Unspecified place in unspecified non-institutional (private) residence as the place of occurrence of the external cause: Secondary | ICD-10-CM | POA: Insufficient documentation

## 2011-02-10 DIAGNOSIS — Z79899 Other long term (current) drug therapy: Secondary | ICD-10-CM | POA: Insufficient documentation

## 2011-02-10 DIAGNOSIS — F3289 Other specified depressive episodes: Secondary | ICD-10-CM | POA: Insufficient documentation

## 2011-02-14 ENCOUNTER — Other Ambulatory Visit (INDEPENDENT_AMBULATORY_CARE_PROVIDER_SITE_OTHER): Payer: Medicare Other

## 2011-02-14 ENCOUNTER — Telehealth: Payer: Self-pay | Admitting: Family Medicine

## 2011-02-14 DIAGNOSIS — Z0000 Encounter for general adult medical examination without abnormal findings: Secondary | ICD-10-CM

## 2011-02-14 DIAGNOSIS — S41109A Unspecified open wound of unspecified upper arm, initial encounter: Secondary | ICD-10-CM

## 2011-02-14 MED ORDER — HYDROXYZINE HCL 25 MG PO TABS: 25.0000 mg | ORAL_TABLET | Freq: Three times a day (TID) | ORAL | Status: DC | PRN

## 2011-02-14 MED ORDER — LEVOTHYROXINE SODIUM 50 MCG PO TABS: 50.0000 ug | ORAL_TABLET | Freq: Every day | ORAL | Status: DC

## 2011-02-14 NOTE — Telephone Encounter (Signed)
Synthroid and hydroxyzine were renewed

## 2011-03-06 ENCOUNTER — Ambulatory Visit (INDEPENDENT_AMBULATORY_CARE_PROVIDER_SITE_OTHER): Payer: Medicare Other | Admitting: Medical

## 2011-03-06 ENCOUNTER — Encounter: Payer: Self-pay | Admitting: Medical

## 2011-03-06 VITALS — BP 120/62 | HR 60 | Temp 97.5°F | Resp 20 | Ht 75.0 in | Wt 255.0 lb

## 2011-03-06 DIAGNOSIS — E119 Type 2 diabetes mellitus without complications: Secondary | ICD-10-CM

## 2011-03-06 DIAGNOSIS — M1990 Unspecified osteoarthritis, unspecified site: Secondary | ICD-10-CM

## 2011-03-06 DIAGNOSIS — M25569 Pain in unspecified knee: Secondary | ICD-10-CM

## 2011-03-06 DIAGNOSIS — D759 Disease of blood and blood-forming organs, unspecified: Secondary | ICD-10-CM

## 2011-03-06 DIAGNOSIS — Z23 Encounter for immunization: Secondary | ICD-10-CM

## 2011-03-06 DIAGNOSIS — M25561 Pain in right knee: Secondary | ICD-10-CM

## 2011-03-06 DIAGNOSIS — Z79899 Other long term (current) drug therapy: Secondary | ICD-10-CM

## 2011-03-06 DIAGNOSIS — D649 Anemia, unspecified: Secondary | ICD-10-CM

## 2011-03-06 DIAGNOSIS — R58 Hemorrhage, not elsewhere classified: Secondary | ICD-10-CM

## 2011-03-06 DIAGNOSIS — I10 Essential (primary) hypertension: Secondary | ICD-10-CM

## 2011-03-06 DIAGNOSIS — N189 Chronic kidney disease, unspecified: Secondary | ICD-10-CM

## 2011-03-06 LAB — CBC WITH DIFFERENTIAL/PLATELET
Eosinophils Absolute: 0.5 10*3/uL (ref 0.0–0.7)
Hemoglobin: 10.5 g/dL — ABNORMAL LOW (ref 13.0–17.0)
Lymphocytes Relative: 30 % (ref 12–46)
Lymphs Abs: 1.7 10*3/uL (ref 0.7–4.0)
MCH: 31.3 pg (ref 26.0–34.0)
Monocytes Relative: 11 % (ref 3–12)
Neutro Abs: 2.8 10*3/uL (ref 1.7–7.7)
Neutrophils Relative %: 50 % (ref 43–77)
RBC: 3.35 MIL/uL — ABNORMAL LOW (ref 4.22–5.81)

## 2011-03-06 LAB — POCT URINALYSIS DIPSTICK
Blood, UA: NEGATIVE
Ketones, UA: NEGATIVE
Protein, UA: NEGATIVE
Spec Grav, UA: 1.01
Urobilinogen, UA: NEGATIVE

## 2011-03-06 LAB — FOLATE: Folate: >20 ng/mL

## 2011-03-06 LAB — HEMOGLOBIN A1C
Hgb A1c MFr Bld: 5.7 % — ABNORMAL HIGH (ref <5.7)
Mean Plasma Glucose: 117 mg/dL — ABNORMAL HIGH (ref <117)

## 2011-03-06 MED ORDER — SILVER SULFADIAZINE 1 % EX CREA: TOPICAL_CREAM | CUTANEOUS | Status: DC

## 2011-03-06 NOTE — Progress Notes (Signed)
Subjective:   HPI  Zachary Vargas is a 77 y.o. male who presents for a surgery clearance physical.  He has suffered with bilat knee pain for years, but the right knee has finally gotten so bad that he can't stand it.  He notes severe right knee pains, the knee gives out routinely and he has fallen several times due to the pain.  He plans to have surgery in November.  Ortho already sent [place] a clearance request.   He just recently saw cardiology for a stress test and echo, and he notes that Dr. Clarene Duke said his heart was fine.    He is a diabetic and has been trying to watch his diet carefully.  He has been checking glucose in the mornings, getting 120-130s.  He has improved on his diet.  He loves to walk outside, but the knee is very limiting.    He notes ongoing depressed relationship between him and his wife.   He notes that his marriage is not good, but they have tolerated each other for a while.  He tries to keep good spirits.    He has a hx/o chronic renal disease as well as hx/o IgA myeloma?.  He had seen both [facility] prior, but felt that he was getting a run around without any real clear diagnosis or plan of action, so he never went back after several visits of being told everything was ok and to f/u in 24mo over and over.   Reviewed their medical, surgical, family, social, medication, and allergy history and updated chart as appropriate.  Past Medical History  Diagnosis Date  . Glaucoma   . Diabetes mellitus   . Anemia     of chronic disease  . History of aortic aneurysm repair   . Hypothyroidism   . History of renal stone   . Dyslipidemia     statin intolerant  . Colon polyps   . Melanoma in situ     History of  . Gout   . Allergy   . BPH (benign prostatic hyperplasia)     [facility]  . History of peptic ulcer disease   . Peptic ulcer disease   . Diverticulosis   . Lumbar spinal stenosis     Most recent MRI 12/ 2011   . Pulmonary disease    Fibrotic parenchymal changes per 5/11 x-ray  . Carotid atherosclerosis     Dopplers 11/09 showing 50-69% stenosis  . Chronic kidney disease     2/08 renal artery ultrasound without significant stenosis; followed by [facility]  . Peripheral arterial disease     [place] [facility] care consult 1/08  . Epigastric pain     Hospitalization 7/11 [place]  . Arthritis     [facility]ed [facility]  . Myeloma     IgA related; Dr. Myna Hidalgo  . Pneumonia 2006    hospitalized    Past Surgical History  Procedure Date  . Abdominal aortic aneurysm repair 03/1998  . Abdominal hernia repair 2000  . Renal stone surgery 1999  . Nerve root block     Multiple for lumbar spine    Family History  Problem Relation Age of Onset  . Diabetes Father   . Glaucoma Father   . Cancer Brother     1 died of lung cancer, 1 of colon, 1 of different cancer  . Glaucoma Daughter   . Glaucoma Son     History   Social History  .  Marital Status: Married    Spouse Name: N/A    Number of Children: N/A  . Years of Education: N/A   Occupational History  . Not on file.   Social History Main Topics  . Smoking status: Former Games developer  . Smokeless tobacco: Never Used  . Alcohol Use: Yes     4 oz drink daily  . Drug Use: No  . Sexually Active: Not on file     unhappy in his marriage   Other Topics Concern  . Not on file   Social History Narrative  . No narrative on file    Current Outpatient Prescriptions on File Prior to Visit  Medication Sig Dispense Refill  . aspirin 81 MG tablet Take 81 mg by mouth daily.        . dorzolamide-timolol (COSOPT) 22.3-6.8 MG/ML ophthalmic solution Place 1 drop into both eyes 2 (two) times daily.        Marland Kitchen glipiZIDE (GLUCOTROL) 5 MG tablet 1/2 tablet daily  30 tablet  3  . glucose blood test strip Use as instructed, check glucose fasting every morning  100 each  12  . hydrOXYzine (ATARAX) 25 MG tablet Take 1 tablet (25 mg total) by mouth 3 (three) times daily  as needed.  180 tablet  4  . Lancet Devices (AUTO-LANCET) MISC Check glucose every morning fasting  100 each  12  . latanoprost (XALATAN) 0.005 % ophthalmic solution Place 1 drop into the right eye at bedtime.        Marland Kitchen levothyroxine (SYNTHROID, LEVOTHROID) 50 MCG tablet Take 1 tablet (50 mcg total) by mouth daily.  90 tablet  4  . lisinopril (PRINIVIL,ZESTRIL) 10 MG tablet Take 10 mg by mouth daily.        Marland Kitchen PARoxetine (PAXIL) 40 MG tablet Take 40 mg by mouth every morning.        . zolpidem (AMBIEN) 10 MG tablet Take 1 tablet (10 mg total) by mouth at bedtime as needed.  90 tablet  1    Allergies  Allergen Reactions  . Morphine And Related Shortness Of Breath    hives  . Allopurinol   . Statins     Hives; pt notes that "all" classes of cholesterol meds do the same      Review of Systems Constitutional: denies fever, chills, sweats, unexpected weight change, anorexia, fatigue Allergy: negative; denies recent sneezing, itching, congestion Dermatology: +gets bruising and rash on legs, intermittent.  He recently scratched his right leg to the point that it started bleeding.  Has this covered with bandage for now.  ENT: no runny nose, ear pain, sore throat, hoarseness, sinus pain, teeth pain, tinnitus, hearing loss, epistaxis Cardiology: denies chest pain, palpitations, edema, orthopnea, paroxysmal nocturnal dyspnea Respiratory: denies cough, shortness of breath, dyspnea on exertion, wheezing, hemoptysis Gastroenterology: denies abdominal pain, nausea, vomiting, diarrhea, constipation, blood in stool, changes in bowel movement, dysphagia Hematology: denies bleeding or bruising problems Musculoskeletal: +severe knee pain, bilat, R>L; he notes arthralgias throughout; denies neck pain, cramping, gait changes Urology: denies dysuria, difficulty urinating, hematuria, urinary frequency, urgency, incontinence Neurology: +frequent falls, dizziness; no headache, weakness, tingling, numbness, speech  abnormality, memory loss, dizziness Psychology: +depression   Objective:   Physical Exam  Filed Vitals:   03/06/11 0911  BP: 120/62  Pulse: 60  Temp: 97.5 F (36.4 C)  Resp: 20    General appearance: alert, no distress, WD/WN, white male, seated with cane, ambulates with pain in right knee, limp Skin: several areas  of superficial ecchymosis along superior lower legs, 1 particular area of right anteromedial lower leg with 2cm round abrasion, underlying ecchymosis, but with slight purulent drainage.  No warmth, induration, or fluctuance though.  Scatted benign appearing macules throughout.  No other worrisome lesions.  HEENT: normocephalic, conjunctiva/corneas normal, sclerae anicteric, PERRLA, EOMi, nares patent, no discharge or erythema, pharynx normal Oral cavity: MMM, tongue normal, upper denture, teeth otherwise in good repair Neck: supple, no lymphadenopathy, no thyromegaly, no masses, normal ROM, no bruit Chest: non tender, normal shape and expansion Heart: RRR, normal S1, S2, no murmurs Lungs: CTA bilaterally, no wheezes, rhonchi, or rales Abdomen: +bs, soft, vertical surgical scar, somewhat hardened rectangular area beneath surgical scar suggestive of mesh from prior repair; otherwise non tender, non distended, no masses, no hepatomegaly, no splenomegaly, no bruits Back: non tender, normal ROM, no scoliosis Musculoskeletal: bony arthritic changes of bilat knees, bilat hands and fingers, tender along right knee generalized, decreased ROM due to pain;  otherwise upper extremities non tender, no obvious deformity, normal ROM throughout, lower extremities non tender, no obvious deformity, normal ROM throughout Extremities: no edema, no cyanosis, no clubbing Pulses: 1+ symmetric, upper and lower extremities, normal cap refill, +varicosities of lower extremities Neurological: alert, oriented x 3, CN2-12 intact,strength of both legs seems 4-5/5 symmetrical, otherwise strength relatively  normal upper extremities and lower extremities, sensation normal throughout, DTRs 2+ throughout, no cerebellar signs Psychiatric: normal affect, behavior normal, pleasant  GU: normal male external genitalia, no mass, no hernia, no rash, no inguinal lymphadenopathy Rectal: deferred   Assessment :    Encounter Diagnoses  Name Primary?  . Osteoarthritis   . Right knee pain   . Anemia   . Chronic kidney disease Yes  . Need for prophylactic vaccination and inoculation against influenza   . Need for pneumococcal vaccination   . Type II or unspecified type diabetes mellitus without mention of complication, not stated as uncontrolled   . Essential hypertension, benign   . Blood disorder   . Encounter for long-term (current) use of other medications   . Bleeding       Plan:    Physical exam - discussed healthy lifestyle, diet, exercise, preventative care, vaccinations, and addressed their concerns.    Osteoarthritis and knee pain - pending labs and chart review, will consider surgery clearance.   Anemia - recheck labs today.  He is taking OTC iron BID for the last few months.   Chronic kidney disease - repeat labs today.  Flu and pneumococcal vaccines given today with VIS.  Diabetes Mellitus type II - he has been working on his diet, home glucose numbers have been at goal.  Labs today.   Hypertension - controlled.   Labs today for surgery clearance.   Silvadene cream for the right anteromedial leg wound, secondary to superficial excoriation of varicose vein.   Reviewed recent cardiology notes from 01/24/11 through Dr. Clarene Duke.  Echo with mild aortic leaflet calcifications, mild sclerosis of aortic valve, but no significant valvular disease.  He also had a Myoview nuclear perfusion study 01/24/11 which showed low risk scan with no ischemia or infarct, normal perfusion.    I will review his prior [facility] notes as Hematology notes and discuss case with Dr. Susann Givens for  consideration of surgery clearance.

## 2011-03-07 ENCOUNTER — Telehealth: Payer: Self-pay | Admitting: Family Medicine

## 2011-03-07 LAB — IRON: Iron: 123 ug/dL (ref 42–165)

## 2011-03-07 LAB — IBC PANEL: %SAT: 38 % (ref 20–55)

## 2011-03-07 LAB — BASIC METABOLIC PANEL
CO2: 21 mEq/L (ref 19–32)
Chloride: 106 mEq/L (ref 96–112)
Glucose, Bld: 87 mg/dL (ref 70–99)
Potassium: 5 mEq/L (ref 3.5–5.3)
Sodium: 139 mEq/L (ref 135–145)

## 2011-03-07 NOTE — Telephone Encounter (Addendum)
Message copied by Janeice Robinson on Tue Mar 07, 2011 10:05 AM ------      Message from: [place], Kermit Balo      Created: Tue Mar 07, 2011  9:00 AM       Overall, most labs came back ok or as expected.  His anemia is stable, iron/b12/folate levels ok.  However, his kidney function came back quite high.  I think it is due to being a little dehydrated.  I want him to drink a glass of water every 1-2 hours in general, daily.  I'll need to repeat his kidney lab again in 1 wk.  Make sure when he comes in for this he is well hydrated.  I can't release him to surgery until the kidney function is back to normal.              Otherwise, I have reviewed his chart, and once his kidney function seems ok, we'll release for surgery.        Patient was notified of lab results and he agreeded to F/U in one week. CLS

## 2011-03-14 ENCOUNTER — Other Ambulatory Visit: Payer: Medicare Other

## 2011-03-14 DIAGNOSIS — N189 Chronic kidney disease, unspecified: Secondary | ICD-10-CM

## 2011-03-14 LAB — BASIC METABOLIC PANEL
CO2: 21 mEq/L (ref 19–32)
Chloride: 110 mEq/L (ref 96–112)
Creat: 1.69 mg/dL — ABNORMAL HIGH (ref 0.50–1.35)
Glucose, Bld: 86 mg/dL (ref 70–99)
Sodium: 140 mEq/L (ref 135–145)

## 2011-03-16 ENCOUNTER — Encounter: Payer: Self-pay | Admitting: Medical

## 2011-03-16 ENCOUNTER — Telehealth: Payer: Self-pay | Admitting: Family Medicine

## 2011-03-16 NOTE — Telephone Encounter (Signed)
Labs improved.  I am working on writing the letter to ortho to proceed with surgery.

## 2011-03-17 ENCOUNTER — Telehealth: Payer: Self-pay | Admitting: Family Medicine

## 2011-03-17 NOTE — Telephone Encounter (Signed)
Zachary Vargas called, upset that no one called him regarding his labs.  He said with so much wrong with him he is not going to have the knee surgery.

## 2011-03-20 NOTE — Telephone Encounter (Signed)
I spoke with the patient regarding his letter that he was clear for surgery and he states that he was going to cancell the surgery. CLS

## 2011-03-20 NOTE — Telephone Encounter (Signed)
The most recent labs show the renal function stable.  I have already sent over a letter to his orthopedic that he is now clear for surgery.  Certainly the decision is up to him, but Dr. Susann Givens and I both looked at his labs and medical history, and we are ok with him having surgery.

## 2011-03-20 NOTE — Telephone Encounter (Signed)
Patient was notified that the letter was sent to the ortho. Doctor clearing him to have surgery but patient states he is cancelling the surgery. CLS

## 2011-04-18 NOTE — Progress Notes (Signed)
Addended by: Laureen Ochs F on: 04/18/2011 03:20 PM   Modules accepted: Orders

## 2011-05-16 ENCOUNTER — Ambulatory Visit (INDEPENDENT_AMBULATORY_CARE_PROVIDER_SITE_OTHER): Payer: Medicare Other | Admitting: Medical

## 2011-05-16 ENCOUNTER — Encounter: Payer: Self-pay | Admitting: Medical

## 2011-05-16 VITALS — BP 150/70 | HR 60 | Temp 98.1°F | Resp 18

## 2011-05-16 DIAGNOSIS — J069 Acute upper respiratory infection, unspecified: Secondary | ICD-10-CM

## 2011-05-16 MED ORDER — AMOXICILLIN 500 MG PO TABS: ORAL_TABLET | ORAL | Status: DC

## 2011-05-16 NOTE — Patient Instructions (Signed)
Consider OTC Coricidin HBP for cough and congestion, rest, hydrate well with water, and call if worse or not improving.

## 2011-05-16 NOTE — Progress Notes (Signed)
Subjective:   HPI  Zachary Vargas is a 77 y.o. male who presents with last 8-9 day hx/o not feeling good. Last 2 days bad cough and congestion and sore throat, but feels much better today.  Has used some Mucinex DM with some improvement in getting mucous up.  Feels weak in general up until today.  Not eating as well today given symptoms.  Has had some head pressure.  Denis nausea, vomiting, or diarrhea. He had some shortness of breath this morning.  Denies fever.  Also had bout of constipation, but this much improved today after some Ex-lax.  140 glucose this morning.   Overall he almost canceled the appt as he feels significant better today.  No other aggravating or relieving factors.    No other c/o.  The following portions of the patient's history were reviewed and updated as appropriate: allergies, current medications, past family history, past medical history, past social history, past surgical history and problem list.  Past Medical History  Diagnosis Date  . Glaucoma   . Diabetes mellitus   . Anemia     of chronic disease  . History of aortic aneurysm repair   . Hypothyroidism   . History of renal stone   . Dyslipidemia     statin intolerant  . Colon polyps   . Melanoma in situ     History of melanoma left back - Dr. Jorja Loa  . Gout   . Allergy   . BPH (benign prostatic hyperplasia)     [facility]  . History of peptic ulcer disease   . Peptic ulcer disease   . Diverticulosis   . Lumbar spinal stenosis     Most recent MRI 12/ 2011; [facility] - Dr. Ethelene Hal  . Pulmonary disease     Fibrotic parenchymal changes per 5/11 x-ray  . Carotid atherosclerosis     Dopplers 11/09 showing 50-69% stenosis  . Peripheral arterial disease     [place] [facility] care consult 1/08  . Epigastric pain     Hospitalization 7/11 [place]  . Arthritis     [facility]ed [facility]  . Pneumonia 2006    hospitalized  . Myeloma     IgA related; Dr. Myna Hidalgo - 2009  . Chronic kidney disease      2/08 renal artery ultrasound without significant stenosis; prior consult [facility] consult    Review of Systems Constitutional: -fever, +chills, -sweats, -unexpected -weight change,-fatigue ENT: +runny nose, -ear pain, +sore throat Cardiology:  -chest pain, -palpitations, -edema Respiratory: +cough, +shortness of breath, -wheezing Gastroenterology: -abdominal pain, -nausea, -vomiting, -diarrhea, +constipation Hematology: -bleeding or bruising problems Musculoskeletal: -arthralgias, -myalgias, -joint swelling, -back pain Ophthalmology: +vision changes/blurred vision at times Urology: -dysuria, -difficulty urinating, -hematuria, -urinary frequency, -urgency Neurology: -headache, +weakness, -tingling, -numbness    Objective:   Physical Exam  Filed Vitals:   05/16/11 1104  BP: 150/70  Pulse: 60  Temp: 98.1 F (36.7 C)  Resp: 18    General appearance: alert, no distress, WD/WN, mildly ill appearing HEENT: normocephalic, sclerae anicteric, conjunctiva pink and moist, TMs pearly, nares patent, no discharge or erythema, pharynx normal, tonsils unremarkable Oral cavity: MMM, no lesions Neck: supple, no lymphadenopathy, no thyromegaly, no masses Heart: RRR, normal S1, S2, no murmurs Lungs: CTA bilaterally, no wheezes, rhonchi, or rales Pulses: 2+ symmetric   Assessment and Plan :    Encounter Diagnosis  Name Primary?  . URI (upper respiratory infection) Yes   Given his significant improvement and exam, it  sounds as though he has had a viral URI that may be resolving.   Advised rest, drink plenty of water, and consider Coricidin HBP if worse cough and congestion.   If worsening though with head congestion and cough in the next day or 2, can begin Amoxicillin as we discussed.  Follow-up otherwise in the next month for chronic disease f/u.

## 2011-05-23 ENCOUNTER — Other Ambulatory Visit: Payer: Self-pay | Admitting: Medical

## 2011-05-23 ENCOUNTER — Telehealth: Payer: Self-pay | Admitting: Medical

## 2011-05-23 DIAGNOSIS — G4700 Insomnia, unspecified: Secondary | ICD-10-CM

## 2011-05-23 MED ORDER — GLIPIZIDE 5 MG PO TABS: ORAL_TABLET | ORAL | Status: DC

## 2011-05-23 MED ORDER — PAROXETINE HCL 40 MG PO TABS: 40.0000 mg | ORAL_TABLET | ORAL | Status: DC

## 2011-05-23 MED ORDER — ZOLPIDEM TARTRATE 10 MG PO TABS: 10.0000 mg | ORAL_TABLET | Freq: Every evening | ORAL | Status: DC | PRN

## 2011-05-23 NOTE — Progress Notes (Signed)
RX WAS CALLED OUT TO THE PHARMACY FOR AMBIEN PER SHANE TYSINGER. CLS   DONE!

## 2011-05-24 ENCOUNTER — Other Ambulatory Visit: Payer: Self-pay | Admitting: Internal Medicine

## 2011-05-24 MED ORDER — GLIPIZIDE 5 MG PO TABS: ORAL_TABLET | ORAL | Status: DC

## 2011-05-24 NOTE — Telephone Encounter (Signed)
Pt has an upcoming appt on 1/7

## 2011-05-24 NOTE — Telephone Encounter (Signed)
PATIENT NEEDS TO SCHEDULE AN APPOINTMENT WITH THE DOCTOR.

## 2011-05-30 NOTE — Telephone Encounter (Addendum)
STILL NEEDS REFILL LATANOPROST-LM  12/19 CALLED PT & ADVISED RX'S CALLED IN & HE IS TO CALL EYE DOCTOR FOR REFILL ON EYE DROPS-LM ALSO AMBIEN CAN'T BE FILLED UNTIL 06/07/11

## 2011-05-30 NOTE — Telephone Encounter (Signed)
These are glaucoma drops - needs to be refilled by his eye doctor.  I can't refill this.  This is the 3rd time I've responded to this same request.

## 2011-06-19 ENCOUNTER — Encounter: Payer: Self-pay | Admitting: Medical

## 2011-06-19 ENCOUNTER — Ambulatory Visit (INDEPENDENT_AMBULATORY_CARE_PROVIDER_SITE_OTHER): Payer: Medicare Other | Admitting: Medical

## 2011-06-19 VITALS — BP 138/70 | HR 78 | Temp 97.6°F | Resp 16 | Wt 259.0 lb

## 2011-06-19 DIAGNOSIS — E785 Hyperlipidemia, unspecified: Secondary | ICD-10-CM

## 2011-06-19 DIAGNOSIS — F329 Major depressive disorder, single episode, unspecified: Secondary | ICD-10-CM

## 2011-06-19 DIAGNOSIS — M1990 Unspecified osteoarthritis, unspecified site: Secondary | ICD-10-CM

## 2011-06-19 DIAGNOSIS — E119 Type 2 diabetes mellitus without complications: Secondary | ICD-10-CM

## 2011-06-19 DIAGNOSIS — R52 Pain, unspecified: Secondary | ICD-10-CM

## 2011-06-19 DIAGNOSIS — M129 Arthropathy, unspecified: Secondary | ICD-10-CM

## 2011-06-19 DIAGNOSIS — E1169 Type 2 diabetes mellitus with other specified complication: Secondary | ICD-10-CM | POA: Insufficient documentation

## 2011-06-19 LAB — POCT URINALYSIS DIPSTICK
Blood, UA: NEGATIVE
Nitrite, UA: NEGATIVE
Urobilinogen, UA: NEGATIVE
pH, UA: 5

## 2011-06-19 NOTE — Progress Notes (Signed)
Subjective:   HPI  Zachary Vargas is a 78 y.o. male who presents for routine follow up on chronic issues.  Here for recheck on diabetes.  Checking glucose in the mornings, routine getting in the 125-126.  He is still taking one half tablet glipizide daily. He is fasting today for labs. Given his arthritis and knee pain, and lately pain all over, not exercising much.  He is interested in some type of rehabilitation if it can help him begin exercise more.  He is currently not using anything for pain, and in the past has had problems with quickly becoming dependent on pain medication, thus he likes to avoid these.  Here for recheck on labs for renal insufficiency.  He continues to rely on sleep medication to get to sleep due to his joint pains.  He declines to use any hyperlipidemia medications given intolerant to virtually all classes of lipid lowering medications in the past. He continues to have burning pains in his feet and legs. On depression medication but doesn't help very much.  No other aggravating or relieving factors.    No other c/o.  The following portions of the patient's history were reviewed and updated as appropriate: allergies, current medications, past family history, past medical history, past social history, past surgical history and problem list.  Past Medical History  Diagnosis Date  . Glaucoma   . Diabetes mellitus   . Anemia     of chronic disease  . History of aortic aneurysm repair   . Hypothyroidism   . History of renal stone   . Dyslipidemia     statin intolerant  . Colon polyps   . Melanoma in situ     History of melanoma left back - Dr. Jorja Loa  . Gout   . Allergy   . BPH (benign prostatic hyperplasia)     [facility]  . History of peptic ulcer disease   . Peptic ulcer disease   . Diverticulosis   . Lumbar spinal stenosis     Most recent MRI 12/ 2011; [facility] - Dr. Ethelene Hal  . Pulmonary disease     Fibrotic parenchymal changes per 5/11 x-ray  . Carotid  atherosclerosis     Dopplers 11/09 showing 50-69% stenosis  . Peripheral arterial disease     [place] health care consult 1/08  . Epigastric pain     Hospitalization 7/11 [place]  . Arthritis     [facility]ed by [facility]  . Pneumonia 2006    hospitalized  . Myeloma     IgA related; Dr. Myna Hidalgo - 2009  . Chronic kidney disease     2/08 renal artery ultrasound without significant stenosis; prior consult [place] kidney consult   Review of Systems Constitutional: -fever, -chills, -sweats, -unexpected -weight change,-fatigue ENT: -runny nose, -ear pain, -sore throat Cardiology:  -chest pain, -palpitations, -edema Respiratory: -cough, -shortness of breath, -wheezing Gastroenterology: -abdominal pain, -nausea, -vomiting, -diarrhea, -constipation  Hematology: -bleeding or bruising problems Ophthalmology: -vision changes Urology: -dysuria, -difficulty urinating, -hematuria, -urinary frequency, -urgency Neurology: -headache, -weakness, -tingling, -numbness     Objective:   Physical Exam  Filed Vitals:   06/19/11 0817  BP: 138/70  Pulse: 78  Temp: 97.6 F (36.4 C)  Resp: 16    General appearance: alert, no distress, WD/WN Oral cavity: MMM, no lesions Neck: supple, no lymphadenopathy, no thyromegaly, no masses Heart: RRR, normal S1, S2, no murmurs Lungs: CTA bilaterally, no wheezes, rhonchi, or rales Abdomen: +bs, soft, non tender, non distended,  no masses, no hepatomegaly, no splenomegaly Pulses: 1+ symmetric, upper and lower extremities, normal cap refill MSK: Multiple bony arthritic changes of the bilateral hands at the MCPs and DIPs Neuro: monofilament exam somewhat decreased   Assessment and Plan :    Encounter Diagnoses  Name Primary?  . Type II or unspecified type diabetes mellitus without mention of complication, not stated as uncontrolled Yes  . Generalized pain   . Arthritis   . Depression   . Dyslipidemia    DM type II - compliant.  Labs  today.  Generalized pain - sed rate lab today  Arthritis - consider Capsaicin cream topically, will refer to [facility] for exercise training and rehabilitation given joint pains  Depression - gave dosing instructions to wean off Paxil, and titrate up on Cymbalta for depression and joints pains  Dyslipidemia - no anticipated treatment other than diet control given his intolerance to multiple lipid lowering medications.  Follow-up pending labs.

## 2011-06-19 NOTE — Patient Instructions (Addendum)
Wean off the Paxil by taking 1/2 tablet daily for 5 days, then 1/2 tablet every other day for 1 week then stop.  Begin Cymbalta samples, by taking 1 capsule of the 30mg  daily after the 5th day of weaning down off Paxil.  After 1 week of Cymbalta 30mg  daily, then you can begin the 60mg  capsule daily.    This is for depression and pain.    Consider taking OTC Capsacian cream for your joint pains.  You can rub this on the joints that hurt.    I will check into rehab for exercise for you.  Call me in 2-3 weeks to let me know how you are doing.

## 2011-06-20 ENCOUNTER — Telehealth: Payer: Self-pay | Admitting: Family Medicine

## 2011-06-20 LAB — BASIC METABOLIC PANEL
BUN: 45 mg/dL — ABNORMAL HIGH (ref 6–23)
Calcium: 9.3 mg/dL (ref 8.4–10.5)
Creat: 1.6 mg/dL — ABNORMAL HIGH (ref 0.50–1.35)

## 2011-06-20 LAB — HEMOGLOBIN A1C
Hgb A1c MFr Bld: 5.6 % (ref <5.7)
Mean Plasma Glucose: 114 mg/dL (ref <117)

## 2011-06-20 NOTE — Telephone Encounter (Signed)
Message copied by Janeice Robinson on Tue Jun 20, 2011 11:37 AM ------      Message from: Aleen Campi, DAVID S      Created: Mon Jun 19, 2011  8:16 PM       [facility] refer to [facility] due to severe arthritis, joint pains, difficulty with exercise due to limitations from arthritis.  See me if problems getting this approved.

## 2011-06-20 NOTE — Telephone Encounter (Signed)
I FAX THE REFERRAL FORM TO [facility] FOR THE PATIENTS PT SERVICES. I AM WAITING FOR THEM TO FAX ME BACK THE APPOINTMENT DATE AND TIME. CLS

## 2011-06-21 ENCOUNTER — Ambulatory Visit: Payer: Medicare Other

## 2011-06-26 ENCOUNTER — Ambulatory Visit: Payer: Medicare Other | Attending: Family Medicine

## 2011-06-26 DIAGNOSIS — M2550 Pain in unspecified joint: Secondary | ICD-10-CM | POA: Insufficient documentation

## 2011-06-26 DIAGNOSIS — M6281 Muscle weakness (generalized): Secondary | ICD-10-CM | POA: Insufficient documentation

## 2011-06-26 DIAGNOSIS — R5381 Other malaise: Secondary | ICD-10-CM | POA: Insufficient documentation

## 2011-06-26 DIAGNOSIS — IMO0001 Reserved for inherently not codable concepts without codable children: Secondary | ICD-10-CM | POA: Insufficient documentation

## 2011-06-26 DIAGNOSIS — R262 Difficulty in walking, not elsewhere classified: Secondary | ICD-10-CM | POA: Insufficient documentation

## 2011-06-27 ENCOUNTER — Other Ambulatory Visit: Payer: Self-pay | Admitting: Family Medicine

## 2011-06-30 ENCOUNTER — Other Ambulatory Visit: Payer: Medicare Other

## 2011-06-30 DIAGNOSIS — E875 Hyperkalemia: Secondary | ICD-10-CM

## 2011-07-01 LAB — BASIC METABOLIC PANEL
BUN: 43 mg/dL — ABNORMAL HIGH (ref 6–23)
Calcium: 9.3 mg/dL (ref 8.4–10.5)
Glucose, Bld: 101 mg/dL — ABNORMAL HIGH (ref 70–99)
Potassium: 5.1 mEq/L (ref 3.5–5.3)

## 2011-07-04 ENCOUNTER — Telehealth: Payer: Self-pay | Admitting: Internal Medicine

## 2011-07-05 ENCOUNTER — Telehealth: Payer: Self-pay | Admitting: Medical

## 2011-07-05 NOTE — Telephone Encounter (Signed)
Called and left msg for pt to return my call.   i am returning his call from yesterday.  He has begun the Cymbalta.  Wants 90 day with   60mg  once daily.  Major improvement, sleeping better.  [facility].

## 2011-07-07 ENCOUNTER — Other Ambulatory Visit: Payer: Self-pay | Admitting: Medical

## 2011-07-07 MED ORDER — DULOXETINE HCL 60 MG PO CPEP: 60.0000 mg | ORAL_CAPSULE | Freq: Every day | ORAL | Status: DC

## 2011-07-10 ENCOUNTER — Telehealth: Payer: Self-pay | Admitting: Medical

## 2011-07-11 NOTE — Telephone Encounter (Signed)
Patient was notified that [facility] and we left samples up front for him to pick up. CLS

## 2011-07-11 NOTE — Telephone Encounter (Signed)
i [facility].  i placed a bag with 2 wk supply of cymbalta 60mg  on your desk

## 2011-07-11 NOTE — Telephone Encounter (Signed)
done

## 2011-07-28 ENCOUNTER — Telehealth: Payer: Self-pay | Admitting: Medical

## 2011-07-28 NOTE — Telephone Encounter (Signed)
Call and get more information?    Medication side effects? Insurance issue?

## 2011-07-28 NOTE — Telephone Encounter (Signed)
PT CALLED AND STATED THAT HE IS HAVING ISSUES WITH CYMBALTA. PLEASE CALL PT [phone]. [facility] USES [facility].

## 2011-07-28 NOTE — Telephone Encounter (Signed)
PATIENT IS AWARE THAT THE SAMPLES ARE UP FRONT FOR HIM TO PICK UP. CLS

## 2011-08-10 ENCOUNTER — Telehealth: Payer: Self-pay | Admitting: Medical

## 2011-08-11 ENCOUNTER — Telehealth: Payer: Self-pay | Admitting: Family Medicine

## 2011-08-11 NOTE — Telephone Encounter (Signed)
DONE

## 2011-08-11 NOTE — Telephone Encounter (Signed)
Wants rx written for stair lift to put in his hallway, if he has rx he will not have to pay taxes on it   Please call

## 2011-08-11 NOTE — Telephone Encounter (Signed)
Is he doing ok?  How is his mood?  Is he still taking paxil or started back on this?

## 2011-08-11 NOTE — Telephone Encounter (Signed)
PATIENT STATES HE DOING OKAY BETTER THAN WHEN HE WAS ON CYMBALTA. HE SAID HIS MOOD WAS OKAY. HE STATES THAT HE HAS NOT STARTED BACK ON THE PAXIL. HE STATES THAT HE HAS SOME BUT HE WAS GOING TO SEE HOW HE DOES WITHOUT IT FOR NOW. HE IS TIRED OF MEDICATIONS. CLS

## 2011-08-11 NOTE — Telephone Encounter (Signed)
A note was written to help him with his taxes.

## 2011-08-11 NOTE — Telephone Encounter (Signed)
Please call  Needs rx for him to get stair lift chair to put in his hallway

## 2011-09-04 ENCOUNTER — Other Ambulatory Visit: Payer: Self-pay | Admitting: Medical

## 2011-09-04 ENCOUNTER — Telehealth: Payer: Self-pay | Admitting: Internal Medicine

## 2011-09-04 DIAGNOSIS — G4700 Insomnia, unspecified: Secondary | ICD-10-CM

## 2011-09-04 MED ORDER — PAROXETINE HCL 40 MG PO TABS: 40.0000 mg | ORAL_TABLET | ORAL | Status: DC

## 2011-09-04 MED ORDER — ZOLPIDEM TARTRATE 10 MG PO TABS: 10.0000 mg | ORAL_TABLET | Freq: Every evening | ORAL | Status: DC | PRN

## 2011-09-05 NOTE — Progress Notes (Signed)
I [facility] per Kristian Covey PA-C. CLS

## 2011-09-06 NOTE — Telephone Encounter (Signed)
done

## 2011-10-16 ENCOUNTER — Telehealth: Payer: Self-pay | Admitting: Internal Medicine

## 2011-10-16 ENCOUNTER — Other Ambulatory Visit: Payer: Self-pay | Admitting: Medical

## 2011-10-16 MED ORDER — HYDROXYZINE HCL 25 MG PO TABS: 25.0000 mg | ORAL_TABLET | Freq: Three times a day (TID) | ORAL | Status: DC | PRN

## 2011-10-17 NOTE — Telephone Encounter (Signed)
done

## 2011-10-24 ENCOUNTER — Telehealth: Payer: Self-pay | Admitting: Family Medicine

## 2011-10-24 NOTE — Telephone Encounter (Signed)
pls call out strips and lancets, tests BID, refill x 11

## 2011-10-25 ENCOUNTER — Telehealth: Payer: Self-pay | Admitting: Internal Medicine

## 2011-10-25 MED ORDER — GLUCOSE BLOOD VI STRP: ORAL_STRIP | Status: DC

## 2011-10-25 MED ORDER — LISINOPRIL 10 MG PO TABS: 10.0000 mg | ORAL_TABLET | Freq: Every day | ORAL | Status: DC

## 2011-10-25 NOTE — Telephone Encounter (Signed)
I LMOM AT THE PHARMACY CALLING OUT TEST STRIPES AND LANCETS TO [facility] ON [address] PER DAVID SHANE TYSINGER PA-C. CLS

## 2011-10-25 NOTE — Telephone Encounter (Signed)
Pharmacy needed clarification for the ONE TOUCH ULTRA!!! Resend glucose test strips to pharmacy

## 2011-10-25 NOTE — Telephone Encounter (Signed)
I SENT A REFILL TO [facility] FOR 10MG  OF LISINOPRIL FOR THE PATIENT ON 10/25/11. CLS

## 2011-12-04 ENCOUNTER — Telehealth: Payer: Self-pay | Admitting: Internal Medicine

## 2011-12-04 MED ORDER — GLIPIZIDE 5 MG PO TABS: ORAL_TABLET | ORAL | Status: DC

## 2011-12-04 NOTE — Telephone Encounter (Signed)
I SENT THE RX REFILL IN THO THE PATIENTS PHARMACY. CLS

## 2011-12-04 NOTE — Telephone Encounter (Signed)
He needs routine f/u appt too

## 2011-12-04 NOTE — Telephone Encounter (Signed)
Pt is coming in for appt with shane tysinger

## 2011-12-11 ENCOUNTER — Ambulatory Visit (INDEPENDENT_AMBULATORY_CARE_PROVIDER_SITE_OTHER): Payer: Medicare Other | Admitting: Medical

## 2011-12-11 ENCOUNTER — Encounter: Payer: Self-pay | Admitting: Medical

## 2011-12-11 ENCOUNTER — Other Ambulatory Visit: Payer: Self-pay | Admitting: Medical

## 2011-12-11 VITALS — BP 150/80 | HR 91 | Temp 97.9°F | Resp 16

## 2011-12-11 DIAGNOSIS — IMO0001 Reserved for inherently not codable concepts without codable children: Secondary | ICD-10-CM

## 2011-12-11 DIAGNOSIS — I10 Essential (primary) hypertension: Secondary | ICD-10-CM

## 2011-12-11 DIAGNOSIS — F32A Depression, unspecified: Secondary | ICD-10-CM

## 2011-12-11 DIAGNOSIS — F329 Major depressive disorder, single episode, unspecified: Secondary | ICD-10-CM

## 2011-12-11 DIAGNOSIS — D649 Anemia, unspecified: Secondary | ICD-10-CM

## 2011-12-11 DIAGNOSIS — E039 Hypothyroidism, unspecified: Secondary | ICD-10-CM

## 2011-12-11 DIAGNOSIS — N189 Chronic kidney disease, unspecified: Secondary | ICD-10-CM

## 2011-12-11 LAB — CBC
Hemoglobin: 10.3 g/dL — ABNORMAL LOW (ref 13.0–17.0)
MCH: 31.2 pg (ref 26.0–34.0)
MCV: 94.8 fL (ref 78.0–100.0)
RBC: 3.3 MIL/uL — ABNORMAL LOW (ref 4.22–5.81)

## 2011-12-11 LAB — POCT URINALYSIS DIPSTICK
Glucose, UA: NEGATIVE
Spec Grav, UA: 1.015
Urobilinogen, UA: NEGATIVE

## 2011-12-11 NOTE — Progress Notes (Addendum)
Subjective:   HPI  Zachary Vargas is a 78 y.o. male who presents for routine f/u on chronic issues.  He has a history of diabetes, chronic kidney disease, hypothyroidism, anemia, BPH, arthritis and depression.  Here for med check and labs.  Overall he notes that this has been a bad year.  He and wife have not gotten along for years, but she actually moved to [place] and is living with their daughter.  This is costing him a lot of money.  He is most upset because he is having so much difficulty with ADLs.  It takes him a long time to do the laundry, to cook, to clean.  With his bad knees and arthritis, it takes him all day to get the house work done.   He did end up paying for electric elevator device to help him up and down stairs which has made a huge difference.  He went to Urology recently and was prescribed a pill for urinary frequency and BPH.  He has used something in the past that worked fine, but this new medication causes back pain, neck pain, dizziness and shot his glucose up.  He currently sees Urology BPH and eye doctor for glaucoma.  He quit his podiatrist as he reports she falsely accused him of being mean to her nurse. No other new issues.    The following portions of the patient's history were reviewed and updated as appropriate: allergies, current medications, past family history, past medical history, past social history, past surgical history and problem list.  Past Medical History  Diagnosis Date  . Glaucoma   . Diabetes mellitus   . Anemia     of chronic disease  . History of aortic aneurysm repair   . Hypothyroidism   . History of renal stone   . Dyslipidemia     statin intolerant  . Colon polyps   . Melanoma in situ     History of melanoma left back - Dr. Jorja Loa  . Gout   . Allergy   . BPH (benign prostatic hyperplasia)     [facility]  . History of peptic ulcer disease   . Peptic ulcer disease   . Diverticulosis   . Lumbar spinal stenosis     Most recent MRI  12/ 2011; [facility] - Dr. Ethelene Hal  . Pulmonary disease     Fibrotic parenchymal changes per 5/11 x-ray  . Carotid atherosclerosis     Dopplers 11/09 showing 50-69% stenosis  . Peripheral arterial disease     [place] health care consult 1/08  . Epigastric pain     Hospitalization 7/11 [place]  . Arthritis     Followed by [facility]  . Pneumonia 2006    hospitalized  . Myeloma     IgA related; Dr. Myna Hidalgo - 2009  . Chronic kidney disease     2/08 renal artery ultrasound without significant stenosis; prior consult [place] [facility] consult    Allergies  Allergen Reactions  . Morphine And Related Shortness Of Breath    hives  . Allopurinol   . Statins     Hives; pt notes that "all" classes of cholesterol meds do the same      Review of Systems ROS reviewed and was negative other than noted in HPI or above.    Objective:   Physical Exam  General appearance: alert, no distress, WD/WN Skin: arm skin is thin, few small areas of dark brown coloration from prior bruising, healing.  Oral cavity: MMM, no lesions Neck: supple, no lymphadenopathy, no thyromegaly, no masses Heart: RRR, normal S1, S2, no murmurs Lungs: CTA bilaterally, no wheezes, rhonchi, or rales Abdomen: +bs, soft, non tender, non distended, no masses, no hepatomegaly, no splenomegaly MSK: obvious bony growths and arthritis changes of both hands at MCPs and PIPs Pulses: 1+ symmetric, upper and lower extremities, normal cap refill   Assessment and Plan :     Encounter Diagnoses  Name Primary?  . Type II or unspecified type diabetes mellitus without mention of complication, uncontrolled Yes  . Chronic kidney disease   . Essential hypertension, benign   . Depression   . Anemia   . Hypothyroidism    Diabetes - labs today, c/t current medication  Chronic kidney disease - labs today  HTN - he gets white coat hypertension.  His home readings are controlled.  C/t Lisinopril 10mg  daily.  Depression - c/t  Paxil, discussed his recent concerns, home situation and trying to work things out with wife.   Discussed looking into aid to have over the house some to help with ADLs.  Discussed home health eval, but he notes having home health in the past which was useless.    Anemia - labs today, has been low but stable  Hypothyroidism - labs today   Addendum: Of note, he last saw hematology and nephrology in 2009.  Although his blood counts and kidney labs are stable, his uric acid is elevated.  He is allergic to Allopurinol.  I advised we get a consult with nephrology since he hasn't been there since 2009 and to get updated evaluation and management advise in light of his renal function, anemia and uric acid.

## 2011-12-12 LAB — TSH: TSH: 4.965 u[IU]/mL — ABNORMAL HIGH (ref 0.350–4.500)

## 2011-12-12 LAB — URIC ACID: Uric Acid, Serum: 9.7 mg/dL — ABNORMAL HIGH (ref 4.0–7.8)

## 2011-12-12 LAB — PHOSPHORUS: Phosphorus: 4.1 mg/dL (ref 2.3–4.6)

## 2011-12-12 LAB — BASIC METABOLIC PANEL
CO2: 20 mEq/L (ref 19–32)
Glucose, Bld: 72 mg/dL (ref 70–99)
Potassium: 5.3 mEq/L (ref 3.5–5.3)
Sodium: 139 mEq/L (ref 135–145)

## 2011-12-13 ENCOUNTER — Other Ambulatory Visit: Payer: Self-pay | Admitting: Medical

## 2011-12-13 DIAGNOSIS — E039 Hypothyroidism, unspecified: Secondary | ICD-10-CM

## 2011-12-13 DIAGNOSIS — N189 Chronic kidney disease, unspecified: Secondary | ICD-10-CM

## 2011-12-13 DIAGNOSIS — D649 Anemia, unspecified: Secondary | ICD-10-CM

## 2011-12-13 LAB — T4, FREE: Free T4: 1.04 ng/dL (ref 0.80–1.80)

## 2011-12-18 ENCOUNTER — Telehealth: Payer: Self-pay | Admitting: Internal Medicine

## 2011-12-18 ENCOUNTER — Other Ambulatory Visit: Payer: Self-pay | Admitting: Medical

## 2011-12-18 DIAGNOSIS — G4700 Insomnia, unspecified: Secondary | ICD-10-CM

## 2011-12-18 MED ORDER — ALLOPURINOL 300 MG PO TABS: 300.0000 mg | ORAL_TABLET | Freq: Every day | ORAL | Status: DC

## 2011-12-18 MED ORDER — ZOLPIDEM TARTRATE 10 MG PO TABS: 10.0000 mg | ORAL_TABLET | Freq: Every evening | ORAL | Status: DC | PRN

## 2011-12-18 NOTE — Progress Notes (Signed)
I called out Ambien to the pharmacy per Kristian Covey PA-C. CLS

## 2011-12-18 NOTE — Telephone Encounter (Signed)
Pt is allergic to allopurinol 300mg . He had taken it in the past for gout and broke out in welps all over body. Please send another medicine to pharmacy at [facility] at [address]

## 2011-12-18 NOTE — Telephone Encounter (Signed)
Message copied by Joslyn Hy on Mon Dec 18, 2011  5:01 PM ------      Message from: Jac Canavan      Created: Mon Dec 18, 2011  4:57 PM       I sent Allopurinol but didn't realize he was allergic to this. Pls call and find out what kind of reaction he had to this?  Was is serious or just something like nausea?  See if allergic to colchicine? Why was he prescribed Allopurinol in the past?

## 2011-12-19 ENCOUNTER — Telehealth: Payer: Self-pay | Admitting: Internal Medicine

## 2011-12-19 NOTE — Telephone Encounter (Signed)
Pt called twice wondering if another med was called in. Pt was notified that shane had to talk to Dr. Susann Givens about another med to take for his elevated uric acid since he was allergic to allopurinol. And that he really needs to see a kidney doctor since he is in stage 3 kidney disease and that he had refused to go there. But did tell me that he hated going to Dr. Caryn Section that he would call for him to get the right treatment he needed for his kidneys

## 2011-12-19 NOTE — Telephone Encounter (Signed)
Error

## 2011-12-20 NOTE — Telephone Encounter (Signed)
Referral information on this patient has been fax over to [facility]. CLS

## 2011-12-20 NOTE — Telephone Encounter (Signed)
He called back and agreed to nephrology consult.

## 2011-12-26 ENCOUNTER — Telehealth: Payer: Self-pay | Admitting: Family Medicine

## 2011-12-26 NOTE — Telephone Encounter (Signed)
Patient is aware of his appointment per crystal at [facility] on 02/09/12 @ 1100 am with Dr. Caryn Section. CLS I fax over all information on this patient. CLS

## 2012-01-16 ENCOUNTER — Ambulatory Visit (INDEPENDENT_AMBULATORY_CARE_PROVIDER_SITE_OTHER): Payer: Medicare Other | Admitting: Medical

## 2012-01-16 ENCOUNTER — Encounter: Payer: Self-pay | Admitting: Medical

## 2012-01-16 VITALS — BP 130/60 | HR 64 | Temp 97.6°F | Resp 16 | Wt 258.0 lb

## 2012-01-16 DIAGNOSIS — R7989 Other specified abnormal findings of blood chemistry: Secondary | ICD-10-CM

## 2012-01-16 DIAGNOSIS — E039 Hypothyroidism, unspecified: Secondary | ICD-10-CM

## 2012-01-16 DIAGNOSIS — D649 Anemia, unspecified: Secondary | ICD-10-CM

## 2012-01-16 DIAGNOSIS — E119 Type 2 diabetes mellitus without complications: Secondary | ICD-10-CM

## 2012-01-16 DIAGNOSIS — N189 Chronic kidney disease, unspecified: Secondary | ICD-10-CM | POA: Insufficient documentation

## 2012-01-16 DIAGNOSIS — E790 Hyperuricemia without signs of inflammatory arthritis and tophaceous disease: Secondary | ICD-10-CM

## 2012-01-16 DIAGNOSIS — M109 Gout, unspecified: Secondary | ICD-10-CM

## 2012-01-16 LAB — T4, FREE: Free T4: 0.94 ng/dL (ref 0.80–1.80)

## 2012-01-16 NOTE — Progress Notes (Addendum)
Subjective: Here for 1 mo f/u.  Last visit was a routine med check visit with labs.  He is here to review some of the pertinent pieces discussed at last visit and lab results.  He was upset with me on call back as he says he wasn't aware he had kidney disease and anemia.  He says we didn't discuss this last visit.    He denies memory issues, but says now that his wife moved out, he has so much to remember and so many things to keep up with, that he sometimes forgets things.   He struggles with ADLs, but is happy that he is still independent and can keep up with things.  He is not ready to consider assisted living facility or other arrangements.    He has hx/o gout, has daily joint pains.  Last kidney stone 10 years ago but use to get these bad.   He is allergic to allopurinol.    He is glad that his diabetes marker is good, but doesn't want to stop Glipizide since it helps keep him honest with his diet and eating healthy.  Morning fasting glucose routinely 120 or less.   He has no other c/o.    Allergies  Allergen Reactions  . Morphine And Related Shortness Of Breath    hives  . Allopurinol   . Statins     Hives; pt notes that "all" classes of cholesterol meds do the same     Current Outpatient Prescriptions on File Prior to Visit  Medication Sig Dispense Refill  . aspirin 81 MG tablet Take 81 mg by mouth daily.        . dorzolamide-timolol (COSOPT) 22.3-6.8 MG/ML ophthalmic solution Place 1 drop into both eyes 2 (two) times daily.        . ferrous gluconate (FERGON) 325 MG tablet Take 325 mg by mouth daily with breakfast.        . Furosemide (LASIX PO) Take 10 mg by mouth daily.        Marland Kitchen glipiZIDE (GLUCOTROL) 5 MG tablet 1/2 tablet daily PATIENT WILL NEED A OFFICE VISIT BEFORE HIS RX RUNS OUT.  30 tablet  1  . glucose blood test strip Use as instructed, check glucose fasting every morning  100 each  12  . hydrOXYzine (ATARAX/VISTARIL) 25 MG tablet Take 1 tablet (25 mg total) by mouth 3  (three) times daily as needed.  180 tablet  3  . Lancet Devices (AUTO-LANCET) MISC Check glucose every morning fasting  100 each  12  . latanoprost (XALATAN) 0.005 % ophthalmic solution Place 1 drop into the right eye at bedtime.        Marland Kitchen levothyroxine (SYNTHROID, LEVOTHROID) 50 MCG tablet Take 1 tablet (50 mcg total) by mouth daily.  90 tablet  4  . lisinopril (PRINIVIL,ZESTRIL) 10 MG tablet Take 1 tablet (10 mg total) by mouth daily.  90 tablet  1  . PARoxetine (PAXIL) 40 MG tablet Take 1 tablet (40 mg total) by mouth every morning.  90 tablet  1  . zolpidem (AMBIEN) 10 MG tablet Take 1 tablet (10 mg total) by mouth at bedtime as needed.  90 tablet  0    Past Medical History  Diagnosis Date  . Glaucoma   . Diabetes mellitus   . Anemia     of chronic disease  . History of aortic aneurysm repair   . Hypothyroidism   . History of renal stone   . Dyslipidemia  statin intolerant  . Colon polyps   . Melanoma in situ     History of melanoma left back - Dr. Jorja Loa  . Gout   . Allergy   . BPH (benign prostatic hyperplasia)     [facility]  . History of peptic ulcer disease   . Peptic ulcer disease   . Diverticulosis   . Lumbar spinal stenosis     Most recent MRI 12/ 2011; [facility] - Dr. Ethelene Hal  . Pulmonary disease     Fibrotic parenchymal changes per 5/11 x-ray  . Carotid atherosclerosis     Dopplers 11/09 showing 50-69% stenosis  . Peripheral arterial disease     [place] health care consult 1/08  . Epigastric pain     Hospitalization 7/11 [place]  . Arthritis     [facility]ed by [facility]  . Pneumonia 2006    hospitalized  . Myeloma     IgA related; Dr. Myna Hidalgo - 2009  . Chronic kidney disease     2/08 renal artery ultrasound without significant stenosis; prior consult [place] kidney consult    Past Surgical History  Procedure Date  . Abdominal aortic aneurysm repair 03/1998  . Abdominal hernia repair 2000  . Renal stone surgery 1999  . Nerve root block       Multiple for lumbar spine  . Colonoscopy 07/22/2004    due repeat 2011; Dr. Dorena Cookey, [facility]    Family History  Problem Relation Age of Onset  . Diabetes Father   . Glaucoma Father   . Cancer Brother     1 died of lung cancer, 1 of colon, 1 of different cancer  . Glaucoma Daughter   . Glaucoma Son     History   Social History  . Marital Status: Married    Spouse Name: N/A    Number of Children: N/A  . Years of Education: N/A   Occupational History  . Not on file.   Social History Main Topics  . Smoking status: Former Games developer  . Smokeless tobacco: Never Used  . Alcohol Use: Yes     4 oz drink daily  . Drug Use: No  . Sexually Active: Not on file     unhappy in his marriage   Other Topics Concern  . Not on file   Social History Narrative  . No narrative on file    Reviewed their medical, surgical, family, social, medication, and allergy history and updated chart as appropriate.  ROS as noted above in HPI  Objective: Gen: wd, wn, nad Psych: A&O, pleasant, answered questions appropriately  Assessment: Encounter Diagnoses  Name Primary?  . Chronic kidney disease Yes  . Hypothyroidism   . Anemia   . Type II or unspecified type diabetes mellitus without mention of complication, not stated as uncontrolled   . Hyperuricemia   . Gout     Plan: Chronic kidney disease - I again reiterated that he does have stable chronic kidney disease.  I reminded him that he has seen nephrology back in 2009.  discussed the diagnosis, goals to keep the kidney function in check.  He will f/u on 02/09/12 to get updated consult regarding his kidney function, elevated uric acid, and overall plan to keep his kidney function stable.   Hypothyroidism - TSH abnormal last visit.   Repeat TSH and Free T4 today.  Anemia - stable.  Reviewed last labs and will check Epo level today.  Diabetes - well controlled with Glipizide 2.5 mg  daily and diet.   Hyperuricemia/gout -  allergic to allopurinol.  Referral to nephrology for consult on this.   Discussed his overall health, ADLs, housing, and he currently enjoys his independence.   F/u with nephrology as planned.

## 2012-02-20 ENCOUNTER — Encounter: Payer: Self-pay | Admitting: Medical

## 2012-02-23 ENCOUNTER — Other Ambulatory Visit: Payer: Self-pay | Admitting: Cardiology

## 2012-02-23 DIAGNOSIS — N189 Chronic kidney disease, unspecified: Secondary | ICD-10-CM

## 2012-03-01 ENCOUNTER — Encounter (INDEPENDENT_AMBULATORY_CARE_PROVIDER_SITE_OTHER): Payer: Medicare Other

## 2012-03-01 DIAGNOSIS — I10 Essential (primary) hypertension: Secondary | ICD-10-CM

## 2012-03-01 DIAGNOSIS — N189 Chronic kidney disease, unspecified: Secondary | ICD-10-CM

## 2012-03-04 ENCOUNTER — Other Ambulatory Visit: Payer: Medicare Other

## 2012-03-04 ENCOUNTER — Telehealth: Payer: Self-pay | Admitting: Medical

## 2012-03-04 DIAGNOSIS — Z23 Encounter for immunization: Secondary | ICD-10-CM

## 2012-03-04 MED ORDER — INFLUENZA VIRUS VACC SPLIT PF IM SUSP: 0.5000 mL | Freq: Once | INTRAMUSCULAR | Status: AC

## 2012-03-04 NOTE — Telephone Encounter (Signed)
Pt came by to get his flu shot and stated that he has some concerns about another physician. He states that he wanted to discus  It with you. Please call pt at [phone].

## 2012-03-05 NOTE — Telephone Encounter (Signed)
Per Rayna Sexton.. No need to call everything resolved

## 2012-03-08 NOTE — Telephone Encounter (Signed)
I CALLED THE PATIENT TO ASK HIM WHAT WAS HIS CONCERNS ABOUT A DOCTOR AND HE LAUGHED A LITTLE AND SAID TO TELL SHANE TYSINGER PA- C THAT EVERYTHING WAS OKAY AND HE GOT IT ALL TAKEN CARE OF. I ASK HIM AGAIN TO MAKE SURE AND HE REPEATED THAT EVERYTHING IS ALL RIGHT. CLS

## 2012-03-08 NOTE — Telephone Encounter (Signed)
pls call pt and see what his concerns are.  I was on vacation last week, and so far I have been swamped busy this week, so see if you can inquire.   I have reviewed his nephrology notes from [facility], and they want him to have a f/u with Hematology regarding his prior diagnosis of myeloma.   See if agreeable to hematology recheck.

## 2012-03-11 ENCOUNTER — Telehealth: Payer: Self-pay | Admitting: Internal Medicine

## 2012-03-11 ENCOUNTER — Other Ambulatory Visit: Payer: Self-pay | Admitting: Medical

## 2012-03-11 DIAGNOSIS — G4700 Insomnia, unspecified: Secondary | ICD-10-CM

## 2012-03-11 MED ORDER — ZOLPIDEM TARTRATE 10 MG PO TABS: 10.0000 mg | ORAL_TABLET | Freq: Every evening | ORAL | Status: DC | PRN

## 2012-03-11 NOTE — Telephone Encounter (Signed)
Pt needs a refill on his ambien 10mg  [facility]

## 2012-03-12 NOTE — Telephone Encounter (Signed)
Faxed amb[facility] 10mg  [facility] @ [phone]

## 2012-03-19 ENCOUNTER — Encounter: Payer: Self-pay | Admitting: Medical

## 2012-03-28 ENCOUNTER — Telehealth: Payer: Self-pay | Admitting: Medical

## 2012-03-29 ENCOUNTER — Other Ambulatory Visit: Payer: Self-pay | Admitting: Medical

## 2012-03-29 MED ORDER — PAROXETINE HCL 40 MG PO TABS: 40.0000 mg | ORAL_TABLET | ORAL | Status: DC

## 2012-03-29 MED ORDER — LEVOTHYROXINE SODIUM 50 MCG PO TABS: 50.0000 ug | ORAL_TABLET | Freq: Every day | ORAL | Status: DC

## 2012-03-29 MED ORDER — LISINOPRIL 10 MG PO TABS: 10.0000 mg | ORAL_TABLET | Freq: Every day | ORAL | Status: DC

## 2012-03-29 NOTE — Telephone Encounter (Signed)
Sent in synthroid and lisinopril

## 2012-04-01 ENCOUNTER — Telehealth: Payer: Self-pay | Admitting: Medical

## 2012-04-02 ENCOUNTER — Other Ambulatory Visit: Payer: Self-pay | Admitting: Medical

## 2012-04-02 MED ORDER — GLIPIZIDE 5 MG PO TABS: ORAL_TABLET | ORAL | Status: DC

## 2012-04-02 NOTE — Telephone Encounter (Signed)
DONE

## 2012-04-03 ENCOUNTER — Telehealth: Payer: Self-pay | Admitting: Internal Medicine

## 2012-04-03 MED ORDER — GLIPIZIDE 5 MG PO TABS: ORAL_TABLET | ORAL | Status: DC

## 2012-04-03 NOTE — Telephone Encounter (Signed)
Sent in glipizide 5mg . It was previously printed. And pt was waiting on med

## 2012-04-25 ENCOUNTER — Telehealth: Payer: Self-pay | Admitting: Medical

## 2012-04-25 NOTE — Telephone Encounter (Signed)
Please call wants to talk to you about Alzheimers . His wife has it and he has questions

## 2012-06-10 ENCOUNTER — Telehealth: Payer: Self-pay | Admitting: Family Medicine

## 2012-06-10 NOTE — Telephone Encounter (Signed)
Pt came in and wants all of his meds refilled with extra refills.  He does not need any of the eye rx.   He scheduled CPE for Jan 30,2014.  [facility]  Copy of rx list will be put on your desk.

## 2012-06-13 ENCOUNTER — Other Ambulatory Visit: Payer: Self-pay

## 2012-06-13 MED ORDER — GLIPIZIDE 5 MG PO TABS: ORAL_TABLET | ORAL | Status: DC

## 2012-06-13 MED ORDER — LEVOTHYROXINE SODIUM 50 MCG PO TABS: 50.0000 ug | ORAL_TABLET | Freq: Every day | ORAL | Status: DC

## 2012-06-13 MED ORDER — LISINOPRIL 10 MG PO TABS: 10.0000 mg | ORAL_TABLET | Freq: Every day | ORAL | Status: DC

## 2012-06-13 MED ORDER — HYDROXYZINE HCL 25 MG PO TABS: 25.0000 mg | ORAL_TABLET | Freq: Three times a day (TID) | ORAL | Status: DC | PRN

## 2012-06-13 NOTE — Telephone Encounter (Signed)
Sent in med per request of pt and this pt had been seeing Vincenza Hews not sure why I received the refill but they are done

## 2012-06-14 ENCOUNTER — Telehealth: Payer: Self-pay | Admitting: Internal Medicine

## 2012-06-14 MED ORDER — LISINOPRIL 10 MG PO TABS: 10.0000 mg | ORAL_TABLET | Freq: Every day | ORAL | Status: DC

## 2012-06-14 MED ORDER — LEVOTHYROXINE SODIUM 50 MCG PO TABS: 50.0000 ug | ORAL_TABLET | Freq: Every day | ORAL | Status: DC

## 2012-06-14 MED ORDER — GLIPIZIDE 5 MG PO TABS: ORAL_TABLET | ORAL | Status: DC

## 2012-06-14 MED ORDER — HYDROXYZINE HCL 25 MG PO TABS: 25.0000 mg | ORAL_TABLET | Freq: Three times a day (TID) | ORAL | Status: DC | PRN

## 2012-06-14 NOTE — Telephone Encounter (Signed)
Med sent to wrong pharmacy

## 2012-06-18 ENCOUNTER — Telehealth: Payer: Self-pay | Admitting: Family Medicine

## 2012-06-18 ENCOUNTER — Other Ambulatory Visit: Payer: Self-pay | Admitting: Medical

## 2012-06-18 DIAGNOSIS — G4700 Insomnia, unspecified: Secondary | ICD-10-CM

## 2012-06-18 MED ORDER — ZOLPIDEM TARTRATE 10 MG PO TABS: 10.0000 mg | ORAL_TABLET | Freq: Every evening | ORAL | Status: DC | PRN

## 2012-06-18 NOTE — Telephone Encounter (Signed)
Pt called and wants Ambien Refill sent to [facility], he is down to one pill left.

## 2012-06-18 NOTE — Telephone Encounter (Signed)
I called Ambien per Crosby Oyster PA-C to [facility] on [address]. CLS

## 2012-06-18 NOTE — Telephone Encounter (Signed)
Call out the [facility], i sent in a different msg as well

## 2012-07-11 ENCOUNTER — Encounter: Payer: Medicare Other | Admitting: Family Medicine

## 2012-07-30 ENCOUNTER — Encounter: Payer: Self-pay | Admitting: Medical

## 2012-07-30 ENCOUNTER — Ambulatory Visit (INDEPENDENT_AMBULATORY_CARE_PROVIDER_SITE_OTHER): Payer: Medicare Other | Admitting: Medical

## 2012-07-30 VITALS — BP 122/78 | HR 92 | Temp 97.7°F | Resp 18 | Wt 256.0 lb

## 2012-07-30 DIAGNOSIS — E119 Type 2 diabetes mellitus without complications: Secondary | ICD-10-CM

## 2012-07-30 DIAGNOSIS — M109 Gout, unspecified: Secondary | ICD-10-CM

## 2012-07-30 DIAGNOSIS — R3129 Other microscopic hematuria: Secondary | ICD-10-CM

## 2012-07-30 DIAGNOSIS — E039 Hypothyroidism, unspecified: Secondary | ICD-10-CM

## 2012-07-30 DIAGNOSIS — Z7409 Other reduced mobility: Secondary | ICD-10-CM

## 2012-07-30 DIAGNOSIS — N189 Chronic kidney disease, unspecified: Secondary | ICD-10-CM

## 2012-07-30 DIAGNOSIS — F329 Major depressive disorder, single episode, unspecified: Secondary | ICD-10-CM

## 2012-07-30 DIAGNOSIS — F32A Depression, unspecified: Secondary | ICD-10-CM

## 2012-07-30 DIAGNOSIS — D472 Monoclonal gammopathy: Secondary | ICD-10-CM

## 2012-07-30 DIAGNOSIS — R29898 Other symptoms and signs involving the musculoskeletal system: Secondary | ICD-10-CM

## 2012-07-30 LAB — HEMOGLOBIN A1C: Mean Plasma Glucose: 103 mg/dL (ref <117)

## 2012-07-31 ENCOUNTER — Other Ambulatory Visit: Payer: Self-pay | Admitting: Medical

## 2012-07-31 ENCOUNTER — Encounter: Payer: Self-pay | Admitting: Medical

## 2012-07-31 ENCOUNTER — Telehealth: Payer: Self-pay | Admitting: Medical

## 2012-07-31 LAB — T4, FREE: Free T4: 1.09 ng/dL (ref 0.80–1.80)

## 2012-07-31 LAB — TSH: TSH: 4.548 u[IU]/mL — ABNORMAL HIGH (ref 0.350–4.500)

## 2012-07-31 NOTE — Telephone Encounter (Signed)
Which medication?  pls pull chart.  i assume prilosec or similar.

## 2012-07-31 NOTE — Telephone Encounter (Signed)
pls call and ask which medication he is referring to?

## 2012-07-31 NOTE — Progress Notes (Signed)
Subjective: Here for routine f/u.  Since last visit we were finally able to get him to have f/u with nephrology.  He has also seen dermatology and rheumatology in the last few months.  His current complaints include bad joint pains, gouty arthritis, swelling and pain in fingers, pain in both knees which limits his mobility to get around.  He has been on steroid infusion recently due to the joint pains through Dr. Christy Gentles.  He says his sugars have been good, under 130 fasting up until the last few weeks where he has seen some numbers as high as 200.  Still taking glipizide.  He says although he has had a good life, he is now lonely, wife still living in [place], he says he feels like his body if falling apart, not able to get around well.  Since last visit he has though more about assisted living facility, although he has not visited any of these places.  He would like to have more companionship/socialization, but he and wife just never got along.    He has no other c/o.    Allergies  Allergen Reactions  . Morphine And Related Shortness Of Breath    hives  . Allopurinol   . Statins     Hives; pt notes that "all" classes of cholesterol meds do the same     Current Outpatient Prescriptions on File Prior to Visit  Medication Sig Dispense Refill  . aspirin 81 MG tablet Take 81 mg by mouth daily.        . dorzolamide-timolol (COSOPT) 22.3-6.8 MG/ML ophthalmic solution Place 1 drop into both eyes 2 (two) times daily.        . ferrous gluconate (FERGON) 325 MG tablet Take 325 mg by mouth daily with breakfast.        . Furosemide (LASIX PO) Take 10 mg by mouth daily.        Marland Kitchen glipiZIDE (GLUCOTROL) 5 MG tablet 1/2 tablet daily  45 tablet  2  . glucose blood test strip Use as instructed, check glucose fasting every morning  100 each  12  . hydrOXYzine (ATARAX/VISTARIL) 25 MG tablet Take 1 tablet (25 mg total) by mouth 3 (three) times daily as needed.  180 tablet  3  . Lancet Devices (AUTO-LANCET)  MISC Check glucose every morning fasting  100 each  12  . latanoprost (XALATAN) 0.005 % ophthalmic solution Place 1 drop into the right eye at bedtime.        Marland Kitchen levothyroxine (SYNTHROID, LEVOTHROID) 50 MCG tablet Take 1 tablet (50 mcg total) by mouth daily.  90 tablet  1  . lisinopril (PRINIVIL,ZESTRIL) 10 MG tablet Take 1 tablet (10 mg total) by mouth daily.  90 tablet  1  . PARoxetine (PAXIL) 40 MG tablet Take 1 tablet (40 mg total) by mouth every morning.  90 tablet  0  . zolpidem (AMBIEN) 10 MG tablet Take 1 tablet (10 mg total) by mouth at bedtime as needed.  90 tablet  0   Current Facility-Administered Medications on File Prior to Visit  Medication Dose Route Frequency Provider Last Rate Last Dose  . influenza  inactive virus vaccine (FLUZONE/FLUARIX) injection 0.5 mL  0.5 mL Intramuscular Once Joselyn Arrow, MD        Past Medical History  Diagnosis Date  . Glaucoma   . Diabetes mellitus   . Anemia     of chronic disease  . History of aortic aneurysm repair   . Hypothyroidism   .  History of renal stone   . Dyslipidemia     statin intolerant  . Colon polyps   . Melanoma in situ     History of melanoma left back - Dr. Jorja Loa  . Gout   . Allergy   . BPH (benign prostatic hyperplasia)     [facility]  . History of peptic ulcer disease   . Peptic ulcer disease   . Diverticulosis   . Lumbar spinal stenosis     Most recent MRI 12/ 2011; [facility] - Dr. Ethelene Hal  . Pulmonary disease     Fibrotic parenchymal changes per 5/11 x-ray  . Carotid atherosclerosis     Dopplers 11/09 showing 50-69% stenosis  . Peripheral arterial disease     [place] [facility] care consult 1/08  . Epigastric pain     Hospitalization 7/11 [place]  . Arthritis     [facility]ed by [facility]  . Pneumonia 2006    hospitalized  . Myeloma     IgA related; Dr. Myna Hidalgo - 2009  . Chronic kidney disease     2/08 renal artery ultrasound without significant stenosis; prior consult [place] kidney consult     Past Surgical History  Procedure Laterality Date  . Abdominal aortic aneurysm repair  03/1998  . Abdominal hernia repair  2000  . Renal stone surgery  1999  . Nerve root block      Multiple for lumbar spine  . Colonoscopy  07/22/2004    due repeat 2011; Dr. Dorena Cookey, [facility]    Family History  Problem Relation Age of Onset  . Diabetes Father   . Glaucoma Father   . Cancer Brother     1 died of lung cancer, 1 of colon, 1 of different cancer  . Glaucoma Daughter   . Glaucoma Son     History   Social History  . Marital Status: Married    Spouse Name: N/A    Number of Children: N/A  . Years of Education: N/A   Occupational History  . Not on file.   Social History Main Topics  . Smoking status: Former Games developer  . Smokeless tobacco: Never Used  . Alcohol Use: Yes     Comment: 4 oz drink daily  . Drug Use: No  . Sexually Active: Not on file     Comment: unhappy in his marriage   Other Topics Concern  . Not on file   Social History Narrative  . No narrative on file    Reviewed their medical, surgical, family, social, medication, and allergy history and updated chart as appropriate.  ROS as noted above in HPI  Objective: Gen: wd, wn, nad Heart: RRR, normal S1, S2, no murmurs Lungs: clear Pulses normal MSK: 2nd and 3rd fingers with swelling, bony arthritic changes, tenderness throughout from MCP to distal phalanx, right ankle generalized tenderness Psych: A&O, pleasant, answered questions appropriately  Assessment: Encounter Diagnoses  Name Primary?  . Type II or unspecified type diabetes mellitus without mention of complication, not stated as uncontrolled Yes  . Unspecified hypothyroidism   . Microscopic hematuria   . Chronic kidney disease   . Gout   . Depression   . Impaired mobility and ADLs   . MGUS (monoclonal gammopathy of unknown significance)     Plan: Overall today he seems as his usual self, unhappy, lonely, in pain.  He now  seems more open to looking into assisted living facilty though.  He gives me permission today to have a  representative from assisted living come and talk to him about what they can offer.  For his safety, limitations with ADLs, and socialization, this could be a good step.  However, we will approach this cautiously.  Unfortunately he never brings family in with him which would be helpful in the discussion.  I have reviewed his recent rheumatology and nephrology notes.  His kidney disease and anemia is stable, stage 3, questionable etiology.  He is compliant with his medications.  We will check HgbA1C and thyroid labs.  I reviewed the recent labs from rheumatology.  He still has microscopic hematuria today as was noted by nephrology.  He states he has an appt in May to see Dr. MacDiarmid/Urology for this reason.    Specifically we talked about his MGUS diagnosis. I recommended a followup with oncology just as nephrology recommended this.  He refuses referral.  He does not seem willing to have folllow up on this for whatever reason.  He is aware of the diagnosis, but currently is focused on his joint pains.  Rheumatology notes recommended Krystexxa or Colchicine for prophylaxis.  Nephrology also recommended Uloric as another option.  He declined all three at the time, but again notes today that he wants to do something different to help his joint pains.  I advised that we will contact rheumatology to collaborate on this, but will likely add on Colchicine.    We will request copies of the recent renal artery ultrasound and renal ultrasound.

## 2012-07-31 NOTE — Telephone Encounter (Signed)
CHART AT THE NURSES STATION

## 2012-08-02 ENCOUNTER — Other Ambulatory Visit: Payer: Self-pay | Admitting: Medical

## 2012-08-02 ENCOUNTER — Telehealth: Payer: Self-pay | Admitting: Medical

## 2012-08-02 DIAGNOSIS — R319 Hematuria, unspecified: Secondary | ICD-10-CM

## 2012-08-02 MED ORDER — NITROFURANTOIN MONOHYD MACRO 100 MG PO CAPS: 100.0000 mg | ORAL_CAPSULE | Freq: Two times a day (BID) | ORAL | Status: DC

## 2012-08-02 NOTE — Telephone Encounter (Signed)
Called pt and pt got something OTC and it has worked the last 2 nights. Pt can not remember what the medicine was. But he has not had any stomach problems. Pt has never been seen for stomach problems before here and i told pt that shane would not give him anything unless he came in for an appt. But he was going to keep on taking what he got OTC and see if that continues to help and if not he will schedule an appt to see shane

## 2012-08-02 NOTE — Telephone Encounter (Signed)
Patient called stated you wanted to know what meds he got for his ulcer, it is "4X probiotic"

## 2012-08-03 ENCOUNTER — Other Ambulatory Visit: Payer: Self-pay | Admitting: Medical

## 2012-08-03 MED ORDER — HM 4X PROBIOTIC PO TABS: 1.0000 | ORAL_TABLET | Freq: Every day | ORAL | Status: DC

## 2012-08-05 NOTE — Addendum Note (Signed)
Addended by: Janeice Robinson on: 08/05/2012 09:14 AM   Modules accepted: Orders

## 2012-08-06 ENCOUNTER — Telehealth: Payer: Self-pay | Admitting: Medical

## 2012-08-06 ENCOUNTER — Encounter: Payer: Self-pay | Admitting: Medical

## 2012-08-06 NOTE — Telephone Encounter (Signed)
ERROR

## 2012-08-08 ENCOUNTER — Telehealth: Payer: Self-pay | Admitting: Internal Medicine

## 2012-08-08 ENCOUNTER — Other Ambulatory Visit: Payer: Self-pay | Admitting: Medical

## 2012-08-08 MED ORDER — PAROXETINE HCL 40 MG PO TABS: 40.0000 mg | ORAL_TABLET | ORAL | Status: DC

## 2012-08-08 NOTE — Telephone Encounter (Signed)
done

## 2012-08-16 ENCOUNTER — Other Ambulatory Visit: Payer: Medicare Other

## 2012-08-19 ENCOUNTER — Other Ambulatory Visit (INDEPENDENT_AMBULATORY_CARE_PROVIDER_SITE_OTHER): Payer: Medicare Other

## 2012-08-19 DIAGNOSIS — R319 Hematuria, unspecified: Secondary | ICD-10-CM

## 2012-08-19 LAB — POCT URINALYSIS DIPSTICK
Ketones, UA: NEGATIVE
Leukocytes, UA: NEGATIVE
Protein, UA: NEGATIVE
Urobilinogen, UA: NEGATIVE
pH, UA: 5

## 2012-08-29 ENCOUNTER — Ambulatory Visit: Payer: Self-pay | Admitting: Family Medicine

## 2012-09-02 ENCOUNTER — Telehealth: Payer: Self-pay | Admitting: Medical

## 2012-09-02 NOTE — Telephone Encounter (Signed)
pls order in epic and call out [place]

## 2012-09-03 ENCOUNTER — Other Ambulatory Visit: Payer: Self-pay | Admitting: Family Medicine

## 2012-09-03 DIAGNOSIS — G4700 Insomnia, unspecified: Secondary | ICD-10-CM

## 2012-09-03 MED ORDER — ZOLPIDEM TARTRATE 10 MG PO TABS: 10.0000 mg | ORAL_TABLET | Freq: Every evening | ORAL | Status: DC | PRN

## 2012-09-03 NOTE — Telephone Encounter (Signed)
[facility] @ [phone] per the people at [facility]. CLS

## 2012-09-23 ENCOUNTER — Telehealth: Payer: Self-pay | Admitting: Family Medicine

## 2012-09-23 NOTE — Telephone Encounter (Signed)
Pt called and stated he was unable to care for himself any more. He is requesting that home health be called in. please call pt with status.

## 2012-09-23 NOTE — Telephone Encounter (Signed)
Faxed all info to [facility]

## 2012-09-23 NOTE — Telephone Encounter (Signed)
Go ahead and set him up for [facility] to go by and check on him to assess his needs.

## 2012-09-30 ENCOUNTER — Telehealth: Payer: Self-pay | Admitting: Family Medicine

## 2012-09-30 ENCOUNTER — Telehealth: Payer: Self-pay | Admitting: Internal Medicine

## 2012-09-30 MED ORDER — LEVOTHYROXINE SODIUM 50 MCG PO TABS: 50.0000 ug | ORAL_TABLET | Freq: Every day | ORAL | Status: DC

## 2012-09-30 NOTE — Telephone Encounter (Signed)
Bradly Chris from [facility] [phone] nurse- she went out to see patient he had bruising around his left eye and denies fall and not sure how he got the black eye.  Also concern for scattered bruising on lower legs.  Pt is not sleeping well at all.  Ambien is not working due to the pain.

## 2012-09-30 NOTE — Telephone Encounter (Signed)
Pt called and states that he was out of med and needed some asap cause he didn't have but 2 pills

## 2012-09-30 NOTE — Telephone Encounter (Signed)
He needs an appointment

## 2012-10-01 NOTE — Telephone Encounter (Signed)
Called pt to inform him he needs appointment

## 2012-10-03 ENCOUNTER — Encounter: Payer: Self-pay | Admitting: Family Medicine

## 2012-10-03 ENCOUNTER — Ambulatory Visit (INDEPENDENT_AMBULATORY_CARE_PROVIDER_SITE_OTHER): Payer: Medicare Other | Admitting: Family Medicine

## 2012-10-03 VITALS — BP 118/70 | HR 68 | Wt 250.0 lb

## 2012-10-03 DIAGNOSIS — M109 Gout, unspecified: Secondary | ICD-10-CM

## 2012-10-03 DIAGNOSIS — R233 Spontaneous ecchymoses: Secondary | ICD-10-CM

## 2012-10-03 DIAGNOSIS — G8929 Other chronic pain: Secondary | ICD-10-CM

## 2012-10-03 LAB — CBC WITH DIFFERENTIAL/PLATELET
Basophils Absolute: 0 10*3/uL (ref 0.0–0.1)
Basophils Relative: 0 % (ref 0–1)
Eosinophils Relative: 6 % — ABNORMAL HIGH (ref 0–5)
HCT: 32.9 % — ABNORMAL LOW (ref 39.0–52.0)
Lymphocytes Relative: 29 % (ref 12–46)
MCHC: 32.8 g/dL (ref 30.0–36.0)
MCV: 94 fL (ref 78.0–100.0)
Monocytes Absolute: 0.7 10*3/uL (ref 0.1–1.0)
Neutro Abs: 3.7 10*3/uL (ref 1.7–7.7)
Platelets: 233 10*3/uL (ref 150–400)
RDW: 15.1 % (ref 11.5–15.5)
WBC: 6.7 10*3/uL (ref 4.0–10.5)

## 2012-10-03 LAB — COMPREHENSIVE METABOLIC PANEL
ALT: 20 U/L (ref 0–53)
AST: 17 U/L (ref 0–37)
Alkaline Phosphatase: 59 U/L (ref 39–117)
BUN: 55 mg/dL — ABNORMAL HIGH (ref 6–23)
Creat: 1.92 mg/dL — ABNORMAL HIGH (ref 0.50–1.35)

## 2012-10-03 LAB — URIC ACID: Uric Acid, Serum: 10.1 mg/dL — ABNORMAL HIGH (ref 4.0–7.8)

## 2012-10-03 NOTE — Progress Notes (Signed)
  Subjective:    Patient ID: Zachary Vargas, male    DOB: 12/12/1927, 79 y.o.   MRN: [id number]  HPI He is here for consultation. He is being followed by home health. He was noted to have ecchymosis to the left eyelid as well as some bruising on his lower extremities. Has no history of recent falls. He continues to have difficulty with pain and is being followed by Dr. Dierdre Forth. Presently he is taking codeine to help with this. He is also on colchicine. He did have an adverse reaction to allopurinol. The pain is keeping him from sleeping. He continues to do well on his diabetes stating his blood sugars run in the low 100s.   Review of Systems     Objective:   Physical Exam Alert and in no distress. Ecchymosis noted to the upper and lower left eyelids however the anterior chamber and cornea appear normal. He does have some ecchymotic areas present on both lower your niece with thinning of the skin noted on his lower extremities.       Assessment & Plan:  Chronic pain - Plan: CBC with Differential, Comprehensive metabolic panel, Sedimentation rate  Gout - Plan: CBC with Differential, Comprehensive metabolic panel, Uric Acid  Spontaneous ecchymosis his main complaint is continued difficulty with pain interfering with the quality of his life. He'll get the blood work and discuss this further with Dr. Dierdre Forth.

## 2012-10-07 NOTE — Progress Notes (Signed)
  Subjective:    Patient ID: Zachary Vargas, male    DOB: [birth year], 79 y.o.   MRN: [id number]  HPI    Review of Systems     Objective:   Physical Exam        Assessment & Plan:  Case was discussed with Dr. Dierdre Forth. I relayed the information to [name] and recommend he followup with Dr. Dierdre Forth concerning possibly a different approach towards the gout.

## 2012-10-15 ENCOUNTER — Telehealth: Payer: Self-pay | Admitting: Family Medicine

## 2012-10-15 MED ORDER — DOXEPIN HCL 25 MG PO CAPS: 25.0000 mg | ORAL_CAPSULE | Freq: Every day | ORAL | Status: DC

## 2012-10-15 NOTE — Telephone Encounter (Signed)
He is still having difficulty with sleep. I will have him stop Ambien and switch to doxepin

## 2012-10-15 NOTE — Telephone Encounter (Signed)
Pt informed word for word  Pt verbalized understanding

## 2012-10-15 NOTE — Telephone Encounter (Signed)
RENE' WITH [facility] CALLED AND STATED THAT ALTHOUGH Zachary Vargas IS TAKING AMBIEN 10 MG HE IS STILL HAVING LOTS OF SLEEP ISSUES. SHE STATES THAT Zachary Vargas IS REQUESTING THAT HIS DOSE BE INCREASED. Zachary Vargas CAN BE REACHED AT [phone]. PT'S PHARMACY IS [facility].

## 2012-10-15 NOTE — Telephone Encounter (Signed)
Have him stop the Ambien. Let him know that I called in a new prescription. Have him call me in a week after he has been on the new prescription to let me know how it is working

## 2012-10-17 ENCOUNTER — Other Ambulatory Visit: Payer: Self-pay | Admitting: Nephrology

## 2012-10-24 ENCOUNTER — Telehealth: Payer: Self-pay | Admitting: Family Medicine

## 2012-10-24 NOTE — Telephone Encounter (Signed)
Please call new sleep med (doxypen?) not working, did not sleep but very wild, vivid dreams. He has some of old sleep med left and he is going to use that until he hears from you Told him it may be Monday

## 2012-10-25 ENCOUNTER — Other Ambulatory Visit: Payer: Self-pay | Admitting: Family Medicine

## 2012-10-25 ENCOUNTER — Ambulatory Visit
Admission: RE | Admit: 2012-10-25 | Discharge: 2012-10-25 | Disposition: A | Discharge status: Home / Self Care | Payer: Medicare Other | Source: Ambulatory Visit | Attending: Nephrology | Admitting: Nephrology

## 2012-10-25 MED ORDER — ESZOPICLONE 1 MG PO TABS: 3.0000 mg | ORAL_TABLET | Freq: Every day | ORAL | Status: DC

## 2012-10-25 NOTE — Telephone Encounter (Signed)
Give him samples of Lunesta 3mg  through the weekend and have him call me on Monday

## 2012-10-25 NOTE — Telephone Encounter (Signed)
Patient is aware and samples are up front. CLS 

## 2012-10-28 ENCOUNTER — Telehealth: Payer: Self-pay | Admitting: Family Medicine

## 2012-10-28 MED ORDER — ESZOPICLONE 1 MG PO TABS: 3.0000 mg | ORAL_TABLET | Freq: Every day | ORAL | Status: DC

## 2012-10-28 NOTE — Telephone Encounter (Signed)
Let him know that I gave him the strongest strength possible. Try to give a 90 day supply with one refill. We will probably have to fill out a prior auth form on this one

## 2012-10-28 NOTE — Telephone Encounter (Signed)
PT IS AWARE OF DOSE AND OF RX BEING SENT IN

## 2012-10-28 NOTE — Telephone Encounter (Signed)
Printed and waiting to fax

## 2012-10-29 ENCOUNTER — Ambulatory Visit (INDEPENDENT_AMBULATORY_CARE_PROVIDER_SITE_OTHER): Payer: Medicare Other | Admitting: Medical

## 2012-10-29 ENCOUNTER — Encounter: Payer: Self-pay | Admitting: Medical

## 2012-10-29 VITALS — BP 120/68 | HR 60 | Temp 97.7°F | Resp 18

## 2012-10-29 DIAGNOSIS — E119 Type 2 diabetes mellitus without complications: Secondary | ICD-10-CM

## 2012-10-29 DIAGNOSIS — M109 Gout, unspecified: Secondary | ICD-10-CM

## 2012-10-29 DIAGNOSIS — I10 Essential (primary) hypertension: Secondary | ICD-10-CM

## 2012-10-29 DIAGNOSIS — N183 Chronic kidney disease, stage 3 unspecified: Secondary | ICD-10-CM

## 2012-10-29 NOTE — Progress Notes (Signed)
Subjective: Here for c/o glucose/sugars running high.   He brings his glucose numbers in.   Been seeing me and Dr. Susann Givens here.   Recently saw Dr. Susann Givens regarding gout, bruise/black eye, and insomnia.   Checks glucose in mornings only. He is a type II diabetic and although he has several health problems, his sugars have run good.  Fasting for last few months showing mostly 110s up to 150s, occasional high 100s.   Highest in the last several months fasting am was 222, but mostly 110-150s.    Diet - quit eating red meat.   Eating pork, chicken, fish, lots of vegetables.   doesn't eat a lot of breads.  Not exercising due to his severe knee issues, pains.  Also dealing with gout issues.  On medication now, sees Dr. Dierdre Forth again in June.    No recent polyuria, polydipsia, vision changes or shakes.  Lowest recently glucose reading 112.  Taking glipizide 1/2 tablet daily.     Has f/u with [facility] soon regarding his kidney function.    He has hx/o IgA Myeloma, but has refused f/u with Hematology the last several times I've seen him.  Past Medical History  Diagnosis Date  . Glaucoma   . Diabetes mellitus   . Anemia     of chronic disease  . History of aortic aneurysm repair   . Hypothyroidism   . History of renal stone   . Dyslipidemia     statin intolerant  . Colon polyps   . Melanoma in situ     History of melanoma left back - Dr. Jorja Loa  . Gout   . Allergy   . BPH (benign prostatic hyperplasia)     [facility]  . History of peptic ulcer disease   . Peptic ulcer disease   . Diverticulosis   . Lumbar spinal stenosis     Most recent MRI 12/ 2011; [facility] - Dr. Ethelene Hal  . Pulmonary disease     Fibrotic parenchymal changes per 5/11 x-ray  . Carotid atherosclerosis     Dopplers 11/09 showing 50-69% stenosis  . Peripheral arterial disease     [place] [facility] care consult 1/08  . Epigastric pain     Hospitalization 7/11 [place]  . Arthritis     [facility]ed by [facility]  . Pneumonia 2006    hospitalized  . Myeloma     IgA related; Dr. Myna Hidalgo - 2009  . Chronic kidney disease     2/08 renal artery ultrasound without significant stenosis; prior consult [place] kidney consult   ROS as in subjective   Objective: Gen: wd, wn, nad Lungs: clear Heart: RRR, normal S1, S2, no murmurs Pulses normal Ext: no edema   Assessment: Encounter Diagnoses  Name Primary?  . Type II or unspecified type diabetes mellitus without mention of complication, not stated as uncontrolled Yes  . Essential hypertension, benign   . Gout   . Kidney disease, chronic, stage III (GFR 30-59 ml/min)    DM type II - controlled.  HgbA1C 5.3% today.  Reassured that he has no big issue with glucose control currently.  Certainly no hypoglycemia.  C/t current glipizide 1/2 tablet daily, healthy diet, check glucose a few times a week, can't cut back from current daily glucose checks.  C/t healthy low carb/diabetic diet.  HTN - controlled, but f/u with nephrology as planned for guidance given the renal function  Gout - improved on Colcrys, sees Dr. Dierdre Forth in June  CKDIII -  f/u as planned soon in early June with Nephrology

## 2012-11-06 ENCOUNTER — Telehealth: Payer: Self-pay | Admitting: Medical

## 2012-11-06 MED ORDER — GLUCOSE BLOOD VI STRP: ORAL_STRIP | Status: DC

## 2012-11-06 NOTE — Telephone Encounter (Signed)
I sent the patient one touch ultra test trips refills to the [facility] [employer]. CLS

## 2012-11-14 ENCOUNTER — Encounter: Payer: Self-pay | Admitting: Medical

## 2012-11-18 ENCOUNTER — Telehealth: Payer: Self-pay | Admitting: Family Medicine

## 2012-11-19 NOTE — Telephone Encounter (Signed)
We just sent #[phone] of lunesta, 90 day supply.  i can't send anything else similar at this time.  He is on the max dose of Lunesta.  Make sure he is doing the following:  Not eating or drinking within 2 hours of bedtime  Don't watch tv or read in the bed  Make sure room is quiet, dark, and cool  Take warm bath in evening  Try some background noise such as ocean waves sound machine for example  Consider OTC Melatonin 1mg  daily

## 2012-11-20 NOTE — Telephone Encounter (Signed)
Patient was made aware of what Kristian Covey PA-C recommended. CLS

## 2012-11-25 ENCOUNTER — Ambulatory Visit: Payer: Medicare Other | Admitting: Medical

## 2012-11-26 ENCOUNTER — Telehealth: Payer: Self-pay | Admitting: Medical

## 2012-11-26 ENCOUNTER — Telehealth: Payer: Self-pay | Admitting: Family Medicine

## 2012-11-26 ENCOUNTER — Ambulatory Visit (INDEPENDENT_AMBULATORY_CARE_PROVIDER_SITE_OTHER): Payer: Medicare Other | Admitting: Medical

## 2012-11-26 ENCOUNTER — Other Ambulatory Visit (HOSPITAL_COMMUNITY): Payer: Self-pay | Admitting: Orthopedic Surgery

## 2012-11-26 ENCOUNTER — Encounter: Payer: Self-pay | Admitting: Medical

## 2012-11-26 VITALS — BP 120/60 | HR 80 | Temp 97.8°F | Resp 16 | Wt 255.0 lb

## 2012-11-26 DIAGNOSIS — M79675 Pain in left toe(s): Secondary | ICD-10-CM

## 2012-11-26 DIAGNOSIS — M79609 Pain in unspecified limb: Secondary | ICD-10-CM

## 2012-11-26 DIAGNOSIS — M7989 Other specified soft tissue disorders: Secondary | ICD-10-CM

## 2012-11-26 DIAGNOSIS — S91309A Unspecified open wound, unspecified foot, initial encounter: Secondary | ICD-10-CM

## 2012-11-26 DIAGNOSIS — S91302A Unspecified open wound, left foot, initial encounter: Secondary | ICD-10-CM

## 2012-11-26 DIAGNOSIS — E119 Type 2 diabetes mellitus without complications: Secondary | ICD-10-CM

## 2012-11-26 NOTE — Telephone Encounter (Signed)
Patient is aware of his appointment at [facility]. On 11/26/12 @ 215 pm to see Dr. Lajoyce Corners. CLS [phone]

## 2012-11-26 NOTE — Telephone Encounter (Signed)
Please let him know that I am sorry to hear this ,but I was worried about that today.  It looked worrisome.   I pray everything goes ok and that they remove the infection.

## 2012-11-26 NOTE — Progress Notes (Signed)
Subjective: Here for foot concern.  He has been followed by podiatry for at least 2 years now on and off.  About 4 months ago had ingrowing toenail, had procedure at podiatry to remove the toenail, but every since, has had nonhealing wound to left great toe, end of toe.  Podiatry has been watching this every few weeks.  However in the last 5-6 days has redness, swelling, warmth, and now whole left leg is swollen.  Toe hurts.   He is worried about this.  Podiatry had mentioned the possibility of amputation if toe continues to worsen.  Past Medical History  Diagnosis Date  . Glaucoma   . Diabetes mellitus   . Anemia     of chronic disease  . History of aortic aneurysm repair   . Hypothyroidism   . History of renal stone   . Dyslipidemia     statin intolerant  . Colon polyps   . Melanoma in situ     History of melanoma left back - Dr. Jorja Loa  . Gout   . Allergy   . BPH (benign prostatic hyperplasia)     [facility]  . History of peptic ulcer disease   . Peptic ulcer disease   . Diverticulosis   . Lumbar spinal stenosis     Most recent MRI 12/ 2011; [facility] - Dr. Ethelene Hal  . Pulmonary disease     Fibrotic parenchymal changes per 5/11 x-ray  . Carotid atherosclerosis     Dopplers 11/09 showing 50-69% stenosis  . Peripheral arterial disease     [place] health care consult 1/08  . Epigastric pain     Hospitalization 7/11 [place]  . Arthritis     Followed by [facility]  . Pneumonia 2006    hospitalized  . Myeloma     IgA related; Dr. Myna Hidalgo - 2009  . Chronic kidney disease     2/08 renal artery ultrasound without significant stenosis; prior consult [place] kidney consult   ROS as in subjective  Objective: Gen: wd,wn, limited in mobility due to feet, knees in general Left great toe distally on end with yellowish and whitish flesh, and open wound, tender in this area, and distal toe is whitish in general with poor capillary refill, and palpation of the distal toe is  both painful and somewhat hollow suggesting decay/necrosis beneath.  Rest of toe and left lower leg below knee with swelling 1+ edema, warmth, erythema throughout foot, and asymmetric compared to the right foot leg.  Left foot pulses not appreciated, but no cold blue extremity  Assessment Encounter Diagnoses  Name Primary?  . Toe pain, left Yes  . Swelling of left lower extremity   . Wound, open, foot with complication, left, initial encounter   . Type II or unspecified type diabetes mellitus without mention of complication, not stated as uncontrolled    Plan: discussed case with supervising physician who also examined him.  Exam and findings worrisome for osteomyelitis vs cellulitis, and less likely DVT.   Will refer to ortho today for further evaluation and management.

## 2012-11-27 ENCOUNTER — Other Ambulatory Visit (HOSPITAL_COMMUNITY): Payer: Self-pay | Admitting: Orthopedic Surgery

## 2012-11-27 NOTE — Telephone Encounter (Signed)
Patient is aware and he said thank you and thanks for the call. CLS

## 2012-11-28 ENCOUNTER — Encounter (HOSPITAL_COMMUNITY): Payer: Self-pay | Admitting: *Deleted

## 2012-11-28 MED ORDER — CEFAZOLIN SODIUM-DEXTROSE 2-3 GM-% IV SOLR
2.0000 g | INTRAVENOUS | Status: AC
  Administered 2012-11-29: 2 g via INTRAVENOUS
  Filled 2012-11-28 (×2): qty 50

## 2012-11-28 NOTE — Progress Notes (Addendum)
Pt doesn't have a cardiologist  Stress test done with [facility] not sure when not even why it was done  Denies ever having an echo or heart cath  Medical Md[facility]Dr[facility]John Lalone on [facility]  Denies ekg or cxr in past yr

## 2012-11-28 NOTE — Progress Notes (Signed)
Patient notified of time change to be here at 0930 tomorrow

## 2012-11-29 ENCOUNTER — Ambulatory Visit (HOSPITAL_COMMUNITY): Payer: Medicare Other

## 2012-11-29 ENCOUNTER — Encounter (HOSPITAL_COMMUNITY): Payer: Self-pay | Admitting: *Deleted

## 2012-11-29 ENCOUNTER — Encounter (HOSPITAL_COMMUNITY)
Admission: RE | Disposition: A | Discharge status: Home / Self Care | Payer: Self-pay | Source: Ambulatory Visit | Attending: Orthopedic Surgery

## 2012-11-29 ENCOUNTER — Ambulatory Visit (HOSPITAL_COMMUNITY): Payer: Medicare Other | Admitting: *Deleted

## 2012-11-29 ENCOUNTER — Ambulatory Visit (HOSPITAL_COMMUNITY)
Admission: RE | Admit: 2012-11-29 | Discharge: 2012-11-29 | Disposition: A | Discharge status: Home / Self Care | Payer: Medicare Other | Source: Ambulatory Visit | Attending: Orthopedic Surgery | Admitting: Orthopedic Surgery

## 2012-11-29 DIAGNOSIS — I739 Peripheral vascular disease, unspecified: Secondary | ICD-10-CM | POA: Insufficient documentation

## 2012-11-29 DIAGNOSIS — Z888 Allergy status to other drugs, medicaments and biological substances status: Secondary | ICD-10-CM | POA: Insufficient documentation

## 2012-11-29 DIAGNOSIS — I6529 Occlusion and stenosis of unspecified carotid artery: Secondary | ICD-10-CM | POA: Insufficient documentation

## 2012-11-29 DIAGNOSIS — Z882 Allergy status to sulfonamides status: Secondary | ICD-10-CM | POA: Insufficient documentation

## 2012-11-29 DIAGNOSIS — E785 Hyperlipidemia, unspecified: Secondary | ICD-10-CM | POA: Insufficient documentation

## 2012-11-29 DIAGNOSIS — N189 Chronic kidney disease, unspecified: Secondary | ICD-10-CM | POA: Insufficient documentation

## 2012-11-29 DIAGNOSIS — M86679 Other chronic osteomyelitis, unspecified ankle and foot: Secondary | ICD-10-CM | POA: Insufficient documentation

## 2012-11-29 DIAGNOSIS — M109 Gout, unspecified: Secondary | ICD-10-CM | POA: Insufficient documentation

## 2012-11-29 DIAGNOSIS — Z79899 Other long term (current) drug therapy: Secondary | ICD-10-CM | POA: Insufficient documentation

## 2012-11-29 DIAGNOSIS — Z885 Allergy status to narcotic agent status: Secondary | ICD-10-CM | POA: Insufficient documentation

## 2012-11-29 DIAGNOSIS — Z7982 Long term (current) use of aspirin: Secondary | ICD-10-CM | POA: Insufficient documentation

## 2012-11-29 DIAGNOSIS — H409 Unspecified glaucoma: Secondary | ICD-10-CM | POA: Insufficient documentation

## 2012-11-29 DIAGNOSIS — M86172 Other acute osteomyelitis, left ankle and foot: Secondary | ICD-10-CM

## 2012-11-29 DIAGNOSIS — Z87891 Personal history of nicotine dependence: Secondary | ICD-10-CM | POA: Insufficient documentation

## 2012-11-29 DIAGNOSIS — Z8711 Personal history of peptic ulcer disease: Secondary | ICD-10-CM | POA: Insufficient documentation

## 2012-11-29 DIAGNOSIS — Z85820 Personal history of malignant melanoma of skin: Secondary | ICD-10-CM | POA: Insufficient documentation

## 2012-11-29 DIAGNOSIS — E039 Hypothyroidism, unspecified: Secondary | ICD-10-CM | POA: Insufficient documentation

## 2012-11-29 DIAGNOSIS — F411 Generalized anxiety disorder: Secondary | ICD-10-CM | POA: Insufficient documentation

## 2012-11-29 DIAGNOSIS — N400 Enlarged prostate without lower urinary tract symptoms: Secondary | ICD-10-CM | POA: Insufficient documentation

## 2012-11-29 DIAGNOSIS — K5730 Diverticulosis of large intestine without perforation or abscess without bleeding: Secondary | ICD-10-CM | POA: Insufficient documentation

## 2012-11-29 DIAGNOSIS — L97509 Non-pressure chronic ulcer of other part of unspecified foot with unspecified severity: Secondary | ICD-10-CM | POA: Insufficient documentation

## 2012-11-29 DIAGNOSIS — E119 Type 2 diabetes mellitus without complications: Secondary | ICD-10-CM | POA: Insufficient documentation

## 2012-11-29 DIAGNOSIS — D638 Anemia in other chronic diseases classified elsewhere: Secondary | ICD-10-CM | POA: Insufficient documentation

## 2012-11-29 DIAGNOSIS — I129 Hypertensive chronic kidney disease with stage 1 through stage 4 chronic kidney disease, or unspecified chronic kidney disease: Secondary | ICD-10-CM | POA: Insufficient documentation

## 2012-11-29 HISTORY — DX: Constipation, unspecified: K59.00

## 2012-11-29 HISTORY — PX: AMPUTATION: SHX166

## 2012-11-29 HISTORY — DX: Essential (primary) hypertension: I10

## 2012-11-29 HISTORY — DX: Anxiety disorder, unspecified: F41.9

## 2012-11-29 HISTORY — DX: Insomnia, unspecified: G47.00

## 2012-11-29 HISTORY — DX: Frequency of micturition: R35.0

## 2012-11-29 HISTORY — DX: Headache: R51

## 2012-11-29 LAB — COMPREHENSIVE METABOLIC PANEL
ALT: 17 U/L (ref 0–53)
BUN: 52 mg/dL — ABNORMAL HIGH (ref 6–23)
CO2: 21 mEq/L (ref 19–32)
Calcium: 9.4 mg/dL (ref 8.4–10.5)
Creatinine, Ser: 1.72 mg/dL — ABNORMAL HIGH (ref 0.50–1.35)
GFR calc Af Amer: 41 mL/min — ABNORMAL LOW (ref >90)
GFR calc non Af Amer: 35 mL/min — ABNORMAL LOW (ref >90)
Glucose, Bld: 111 mg/dL — ABNORMAL HIGH (ref 70–99)

## 2012-11-29 LAB — CBC
HCT: 29.7 % — ABNORMAL LOW (ref 39.0–52.0)
Hemoglobin: 10.1 g/dL — ABNORMAL LOW (ref 13.0–17.0)
MCH: 31.9 pg (ref 26.0–34.0)
MCV: 93.7 fL (ref 78.0–100.0)
RBC: 3.17 MIL/uL — ABNORMAL LOW (ref 4.22–5.81)

## 2012-11-29 LAB — GLUCOSE, CAPILLARY
Glucose-Capillary: 89 mg/dL (ref 70–99)
Glucose-Capillary: 92 mg/dL (ref 70–99)

## 2012-11-29 LAB — PROTIME-INR: Prothrombin Time: 13.7 seconds (ref 11.6–15.2)

## 2012-11-29 SURGERY — AMPUTATION DIGIT
Anesthesia: General | Site: Foot | Laterality: Left | Wound class: Dirty or Infected

## 2012-11-29 MED ORDER — MUPIROCIN 2 % EX OINT
TOPICAL_OINTMENT | CUTANEOUS | Status: AC
  Filled 2012-11-29: qty 22

## 2012-11-29 MED ORDER — FENTANYL CITRATE 0.05 MG/ML IJ SOLN
INTRAMUSCULAR | Status: DC | PRN
  Administered 2012-11-29: 50 ug via INTRAVENOUS
  Administered 2012-11-29: 25 ug via INTRAVENOUS

## 2012-11-29 MED ORDER — ONDANSETRON HCL 4 MG/2ML IJ SOLN
INTRAMUSCULAR | Status: DC | PRN
  Administered 2012-11-29: 4 mg via INTRAVENOUS

## 2012-11-29 MED ORDER — MEPERIDINE HCL 25 MG/ML IJ SOLN: 6.2500 mg | INTRAMUSCULAR | Status: DC | PRN

## 2012-11-29 MED ORDER — MIDAZOLAM HCL 5 MG/5ML IJ SOLN
INTRAMUSCULAR | Status: DC | PRN
  Administered 2012-11-29: 2 mg via INTRAVENOUS

## 2012-11-29 MED ORDER — MUPIROCIN 2 % EX OINT: TOPICAL_OINTMENT | Freq: Two times a day (BID) | CUTANEOUS | Status: DC

## 2012-11-29 MED ORDER — LACTATED RINGERS IV SOLN
INTRAVENOUS | Status: DC
  Administered 2012-11-29: 12:00:00 via INTRAVENOUS

## 2012-11-29 MED ORDER — EPHEDRINE SULFATE 50 MG/ML IJ SOLN
INTRAMUSCULAR | Status: DC | PRN
  Administered 2012-11-29: 5 mg via INTRAVENOUS
  Administered 2012-11-29: 10 mg via INTRAVENOUS

## 2012-11-29 MED ORDER — LIDOCAINE HCL (CARDIAC) 20 MG/ML IV SOLN
INTRAVENOUS | Status: DC | PRN
  Administered 2012-11-29: 100 mg via INTRAVENOUS

## 2012-11-29 MED ORDER — OXYCODONE HCL 5 MG/5ML PO SOLN: 5.0000 mg | Freq: Once | ORAL | Status: DC | PRN

## 2012-11-29 MED ORDER — 0.9 % SODIUM CHLORIDE (POUR BTL) OPTIME
TOPICAL | Status: DC | PRN
  Administered 2012-11-29: 1000 mL

## 2012-11-29 MED ORDER — ONDANSETRON HCL 4 MG/2ML IJ SOLN: 4.0000 mg | Freq: Once | INTRAMUSCULAR | Status: DC | PRN

## 2012-11-29 MED ORDER — PROPOFOL 10 MG/ML IV BOLUS
INTRAVENOUS | Status: DC | PRN
  Administered 2012-11-29: 150 mg via INTRAVENOUS

## 2012-11-29 MED ORDER — OXYCODONE HCL 5 MG PO TABS: 5.0000 mg | ORAL_TABLET | Freq: Once | ORAL | Status: DC | PRN

## 2012-11-29 MED ORDER — HYDROMORPHONE HCL PF 1 MG/ML IJ SOLN: 0.2500 mg | INTRAMUSCULAR | Status: DC | PRN

## 2012-11-29 SURGICAL SUPPLY — 31 items
BANDAGE GAUZE ELAST BULKY 4 IN (GAUZE/BANDAGES/DRESSINGS) ×1 IMPLANT
BNDG CMPR 9X4 STRL LF SNTH (GAUZE/BANDAGES/DRESSINGS)
BNDG COHESIVE 4X5 TAN STRL (GAUZE/BANDAGES/DRESSINGS) ×1 IMPLANT
BNDG ESMARK 4X9 LF (GAUZE/BANDAGES/DRESSINGS) IMPLANT
CLOTH BEACON ORANGE TIMEOUT ST (SAFETY) ×2 IMPLANT
COVER SURGICAL LIGHT HANDLE (MISCELLANEOUS) ×2 IMPLANT
DRAPE U-SHAPE 47X51 STRL (DRAPES) ×2 IMPLANT
DRSG ADAPTIC 3X8 NADH LF (GAUZE/BANDAGES/DRESSINGS) ×1 IMPLANT
DURAPREP 26ML APPLICATOR (WOUND CARE) ×2 IMPLANT
ELECT REM PT RETURN 9FT ADLT (ELECTROSURGICAL) ×2
ELECTRODE REM PT RTRN 9FT ADLT (ELECTROSURGICAL) ×1 IMPLANT
GLOVE BIOGEL PI IND STRL 9 (GLOVE) ×1 IMPLANT
GLOVE BIOGEL PI INDICATOR 9 (GLOVE) ×1
GLOVE SURG ORTHO 9.0 STRL STRW (GLOVE) ×2 IMPLANT
GOWN PREVENTION PLUS XLARGE (GOWN DISPOSABLE) ×2 IMPLANT
GOWN SRG XL XLNG 56XLVL 4 (GOWN DISPOSABLE) ×1 IMPLANT
GOWN STRL NON-REIN XL XLG LVL4 (GOWN DISPOSABLE) ×2
KIT BASIN OR (CUSTOM PROCEDURE TRAY) ×2 IMPLANT
KIT ROOM TURNOVER OR (KITS) ×2 IMPLANT
MANIFOLD NEPTUNE II (INSTRUMENTS) ×2 IMPLANT
NEEDLE 22X1 1/2 (OR ONLY) (NEEDLE) IMPLANT
NS IRRIG 1000ML POUR BTL (IV SOLUTION) ×2 IMPLANT
PACK ORTHO EXTREMITY (CUSTOM PROCEDURE TRAY) ×2 IMPLANT
PAD ARMBOARD 7.5X6 YLW CONV (MISCELLANEOUS) ×4 IMPLANT
SPONGE GAUZE 4X4 12PLY (GAUZE/BANDAGES/DRESSINGS) ×1 IMPLANT
SUCTION FRAZIER TIP 10 FR DISP (SUCTIONS) IMPLANT
SUT ETHILON 2 0 PSLX (SUTURE) ×2 IMPLANT
SYR CONTROL 10ML LL (SYRINGE) IMPLANT
TOWEL OR 17X24 6PK STRL BLUE (TOWEL DISPOSABLE) ×2 IMPLANT
TOWEL OR 17X26 10 PK STRL BLUE (TOWEL DISPOSABLE) ×2 IMPLANT
TUBE CONNECTING 12X1/4 (SUCTIONS) IMPLANT

## 2012-11-29 NOTE — Anesthesia Postprocedure Evaluation (Signed)
Anesthesia Post Note  Patient: Zachary Vargas  Procedure(s) Performed: Procedure(s) (LRB): Left Foot, Great Toe Amputation at MTP Joint (Left)  Anesthesia type: General  Patient location: PACU  Post pain: Pain level controlled and Adequate analgesia  Post assessment: Post-op Vital signs reviewed, Patient's Cardiovascular Status Stable, Respiratory Function Stable, Patent Airway and Pain level controlled  Last Vitals:  Filed Vitals:   11/29/12 1423  BP: 139/68  Pulse: 63  Temp: 36.4 C  Resp: 16    Post vital signs: Reviewed and stable  Level of consciousness: awake, alert  and oriented  Complications: No apparent anesthesia complications

## 2012-11-29 NOTE — Anesthesia Preprocedure Evaluation (Signed)
Anesthesia Evaluation  Patient identified by MRN, date of birth, ID band Patient awake    Reviewed: Allergy & Precautions, H&P , NPO status , Patient's Chart, lab work & pertinent test results  Airway Mallampati: I TM Distance: >3 FB Neck ROM: Full    Dental   Pulmonary          Cardiovascular hypertension, Pt. on medications + Peripheral Vascular Disease     Neuro/Psych    GI/Hepatic   Endo/Other  diabetes, Type 2, Oral Hypoglycemic AgentsHypothyroidism   Renal/GU CRFRenal disease     Musculoskeletal   Abdominal   Peds  Hematology   Anesthesia Other Findings   Reproductive/Obstetrics                           Anesthesia Physical Anesthesia Plan  ASA: III  Anesthesia Plan: General   Post-op Pain Management:    Induction: Intravenous  Airway Management Planned: LMA  Additional Equipment:   Intra-op Plan:   Post-operative Plan: Extubation in OR  Informed Consent: I have reviewed the patients History and Physical, chart, labs and discussed the procedure including the risks, benefits and alternatives for the proposed anesthesia with the patient or authorized representative who has indicated his/her understanding and acceptance.     Plan Discussed with: CRNA and Surgeon  Anesthesia Plan Comments:         Anesthesia Quick Evaluation

## 2012-11-29 NOTE — Transfer of Care (Signed)
Immediate Anesthesia Transfer of Care Note  Patient: Zachary Vargas  Procedure(s) Performed: Procedure(s) with comments: Left Foot, Great Toe Amputation at MTP Joint (Left) - LEFT  Foot, Great Toe Amputation at MTP Joint  Patient Location: PACU  Anesthesia Type:General  Level of Consciousness: awake, alert  and oriented  Airway & Oxygen Therapy: Patient Spontanous Breathing and Patient connected to nasal cannula oxygen  Post-op Assessment: Report given to PACU RN, Post -op Vital signs reviewed and stable and Patient moving all extremities X 4  Post vital signs: Reviewed and stable  Complications: No apparent anesthesia complications

## 2012-11-29 NOTE — H&P (Signed)
Zachary Vargas is an 79 y.o. male.   Chief Complaint: Osteomyelitis left foot great toe HPI: Patient is a 79 year old gentleman with peripheral vascular disease who presents after failure of conservative treatment for amputation of the left foot great toe do to osteomyelitis.  Past Medical History  Diagnosis Date  . Glaucoma   . History of aortic aneurysm repair   . History of renal stone   . Dyslipidemia     statin intolerant  . Colon polyps   . Melanoma in situ     History of melanoma left back - Dr. Jorja Loa  . Gout     takes Colcrys bid  . Allergy   . BPH (benign prostatic hyperplasia)     [facility]  . History of peptic ulcer disease   . Peptic ulcer disease   . Diverticulosis   . Lumbar spinal stenosis     Most recent MRI 12/ 2011; [facility] - Dr. Ethelene Hal  . Pulmonary disease     Fibrotic parenchymal changes per 5/11 x-ray  . Carotid atherosclerosis     Dopplers 11/09 showing 50-69% stenosis  . Peripheral arterial disease      health care consult 1/08  . Epigastric pain     Hospitalization 7/11 [facility]  . Arthritis     [facility]  . Myeloma     IgA related; Dr. Myna Hidalgo - 2009  . Chronic kidney disease     2/08 renal artery ultrasound without significant stenosis; prior consult [facility]  . Hypertension     takes Lisinopril daily  . Pneumonia 2006    hospitalized  . Headache(784.0)     rarely  . Eczema   . Constipation     d/t pain meds  . Urinary frequency   . Diabetes mellitus     takes glipizide daily  . Glaucoma   . Anxiety     takes Paxil daily  . Insomnia     takes Lunesta nightly  . Hypothyroidism     takes Synthroid daily  . Anemia     of chronic disease;takes Ferrous Gluconate daily    Past Surgical History  Procedure Laterality Date  . Abdominal aortic aneurysm repair  03/1998  . Abdominal hernia repair  2000  . Renal stone surgery  1999  . Nerve root block      Multiple for lumbar spine  .  Colonoscopy  07/22/2004    due repeat 2011; Dr. Dorena Cookey, [facility]  . Eye surgery      bil cataract surgery    Family History  Problem Relation Age of Onset  . Diabetes Father   . Glaucoma Father   . Cancer Brother     1 died of lung cancer, 1 of colon, 1 of different cancer  . Glaucoma Daughter   . Glaucoma Son    Social History:  reports that he has quit smoking. He has never used smokeless tobacco. He reports that  drinks alcohol. He reports that he does not use illicit drugs.  Allergies:  Allergies  Allergen Reactions  . Morphine And Related Shortness Of Breath    hives  . Allopurinol   . Statins     Hives; pt notes that "all" classes of cholesterol meds do the same     Medications Prior to Admission  Medication Sig Dispense Refill  . Ascorbic Acid (VITAMIN C) 1000 MG tablet Take 1,000 mg by mouth daily.      Marland Kitchen aspirin  81 MG tablet Take 81 mg by mouth daily.        . Cholecalciferol (VITAMIN D-3) 5000 UNITS TABS Take by mouth.      . colchicine (COLCRYS) 0.6 MG tablet Take 0.6 mg by mouth 2 (two) times daily.      . dorzolamide-timolol (COSOPT) 22.3-6.8 MG/ML ophthalmic solution Place 1 drop into both eyes 2 (two) times daily.        . eszopiclone (LUNESTA) 1 MG TABS Take 3 tablets (3 mg total) by mouth at bedtime. Take immediately before bedtime  270 tablet  1  . ferrous gluconate (FERGON) 325 MG tablet Take 325 mg by mouth daily with breakfast.        . fish oil-omega-3 fatty acids 1000 MG capsule Take 2 g by mouth daily.      . Furosemide (LASIX PO) Take 40 mg by mouth daily.       Marland Kitchen glipiZIDE (GLUCOTROL) 5 MG tablet 1/2 tablet daily  45 tablet  2  . glucose blood test strip Use as instructed, check glucose fasting every morning  100 each  12  . HYDROcodone-acetaminophen (NORCO/VICODIN) 5-325 MG per tablet Take 1 tablet by mouth every 6 (six) hours as needed for pain.      . hydrOXYzine (ATARAX/VISTARIL) 25 MG tablet Take 1 tablet (25 mg total) by mouth 3  (three) times daily as needed.  180 tablet  3  . Lancet Devices (AUTO-LANCET) MISC Check glucose every morning fasting  100 each  12  . latanoprost (XALATAN) 0.005 % ophthalmic solution Place 1 drop into the right eye at bedtime.        Marland Kitchen levothyroxine (SYNTHROID, LEVOTHROID) 50 MCG tablet Take 0.25 mcg by mouth daily.      Marland Kitchen lisinopril (PRINIVIL,ZESTRIL) 10 MG tablet Take 1 tablet (10 mg total) by mouth daily.  90 tablet  1  . Multiple Vitamins-Minerals (MULTIVITAMIN WITH MINERALS) tablet Take 1 tablet by mouth daily.      Marland Kitchen PARoxetine (PAXIL) 40 MG tablet Take 1 tablet (40 mg total) by mouth every morning.  90 tablet  1  . Probiotic Product (HM 4X PROBIOTIC) TABS Take 1 tablet by mouth daily.  30 tablet  0    Results for orders placed during the hospital encounter of 11/29/12 (from the past 48 hour(s))  APTT     Status: None   Collection Time    11/29/12 10:57 AM      Result Value Range   aPTT 32  24 - 37 seconds  CBC     Status: Abnormal   Collection Time    11/29/12 10:57 AM      Result Value Range   WBC 5.6  4.0 - 10.5 K/uL   RBC 3.17 (*) 4.22 - 5.81 MIL/uL   Hemoglobin 10.1 (*) 13.0 - 17.0 g/dL   HCT 45.4 (*) 09.8 - 11.9 %   MCV 93.7  78.0 - 100.0 fL   MCH 31.9  26.0 - 34.0 pg   MCHC 34.0  30.0 - 36.0 g/dL   RDW 14.7  82.9 - 56.2 %   Platelets 245  150 - 400 K/uL  COMPREHENSIVE METABOLIC PANEL     Status: Abnormal   Collection Time    11/29/12 10:57 AM      Result Value Range   Sodium 139  135 - 145 mEq/L   Potassium 5.1  3.5 - 5.1 mEq/L   Chloride 108  96 - 112 mEq/L   CO2 21  19 - 32 mEq/L   Glucose, Bld 111 (*) 70 - 99 mg/dL   BUN 52 (*) 6 - 23 mg/dL   Creatinine, Ser 1.19 (*) 0.50 - 1.35 mg/dL   Calcium 9.4  8.4 - 14.7 mg/dL   Total Protein 6.8  6.0 - 8.3 g/dL   Albumin 3.2 (*) 3.5 - 5.2 g/dL   AST 18  0 - 37 U/L   ALT 17  0 - 53 U/L   Alkaline Phosphatase 95  39 - 117 U/L   Total Bilirubin 0.3  0.3 - 1.2 mg/dL   GFR calc non Af [name] 35 (*) >90 mL/min   GFR  calc Af [name] 41 (*) >90 mL/min   Comment:            The eGFR has been calculated     using the CKD EPI equation.     This calculation has not been     validated in all clinical     situations.     eGFR's persistently     <90 mL/min signify     possible Chronic Kidney Disease.  PROTIME-INR     Status: None   Collection Time    11/29/12 10:57 AM      Result Value Range   Prothrombin Time 13.7  11.6 - 15.2 seconds   INR 1.06  0.00 - 1.49  GLUCOSE, CAPILLARY     Status: None   Collection Time    11/29/12 11:04 AM      Result Value Range   Glucose-Capillary 89  70 - 99 mg/dL   Dg Chest 2 View  02/08/5620   *RADIOLOGY REPORT*  Clinical Data: Preoperative examination (osteomyelitis of the great toe  CHEST - 2 VIEW  Comparison: 12/26/2010; 10/20/2009; 05/06/2008; chest CT - 10/15/2009  Findings:  Grossly unchanged enlarged cardiac silhouette and mediastinal contours with atherosclerotic calcifications within a slightly tortuous thoracic aorta.  Rather extensive basilar predominant coarse reticular opacities with relative area of consolidation within the peripheral aspect the right mid lung appear grossly unchanged.  No new focal airspace opacities.  No pleural effusion or pneumothorax.  No definite evidence of edema. Interval apparent healing of previously noted medial right clavicular and inferior lateral right-sided rib fractures, incompletely evaluated.  IMPRESSION: Stable fibrotic change and architectural distortion without acute cardiopulmonary disease.   Original Report Authenticated By: Tacey Ruiz, MD    Review of Systems  All other systems reviewed and are negative.    Blood pressure 135/68, pulse 77, temperature 97 F (36.1 C), temperature source Oral, resp. rate 16, height 6\' 3"  (1.905 m), weight 112.7 kg (248 lb 7.3 oz), SpO2 98.00%. Physical Exam  On examination patient has a palpable dorsalis pedis pulse he has ulceration and necrotic bone with chronic osteomyelitis of the  left great toe. Assessment/Plan Assessment: Osteomyelitis abscess ulceration left great toe.  Plan: We'll plan for amputation of the great toe at the MTP joint. Risks and benefits were discussed including infection neurovascular injury persistent pain nonhealing of the wound need for additional surgery. Patient states he understands and wished to proceed at this time.  Adellyn Capek V 11/29/2012, 12:16 PM

## 2012-11-29 NOTE — Op Note (Signed)
OPERATIVE REPORT  DATE OF SURGERY: 11/29/2012  PATIENT:  Zachary Vargas,  79 y.o. male  PRE-OPERATIVE DIAGNOSIS:  osteomyelitis LEFT great toe  POST-OPERATIVE DIAGNOSIS:  osteomyelitis LEFT great toe  PROCEDURE:  Procedure(s): Left Foot, Great Toe Amputation at MTP Joint  SURGEON:  Surgeon(s): Nadara Mustard, MD  ANESTHESIA:   general  EBL:  min ML  SPECIMEN:  No Specimen  TOURNIQUET:  * No tourniquets in log *  PROCEDURE DETAILS: Patient is an 79 year old gentleman with chronic ice myelitis abscess and ulceration left foot great toe. Due to failure conservative care patient presents at this time for amputation of the toe. Risks and benefits were discussed patient states he understands and wishes to proceed at this time. Description of procedure patient was brought to the operating room and underwent a general anesthetic. After adequate levels of anesthesia were obtained patient's left lower extremity was prepped using DuraPrep draped into a sterile field. The great toe was amputated through the MTP joint a fishmouth incision was made this was carried down through the MTP joint the toe ulcer and tissue were resected in one block of tissue. The bone was necrotic. The wound is irrigated with normal saline hemostasis was obtained. There is no evidence of any abscess or any involvement at the MTP joint. The skin was closed using 2-0 nylon. The wound was covered with Adaptic orthopedic sponges ABDs dressing Kerlix and Coban. Patient was extubated taken to the PACU in stable condition.  PLAN OF CARE: Discharge to home after PACU  PATIENT DISPOSITION:  PACU - hemodynamically stable.   Nadara Mustard, MD 11/29/2012 12:59 PM

## 2012-11-29 NOTE — Anesthesia Procedure Notes (Signed)
Procedure Name: LMA Insertion Date/Time: 11/29/2012 12:32 PM Performed by: Elon Alas Pre-anesthesia Checklist: Patient identified, Timeout performed, Emergency Drugs available, Suction available and Patient being monitored Patient Re-evaluated:Patient Re-evaluated prior to inductionOxygen Delivery Method: Circle system utilized Preoxygenation: Pre-oxygenation with 100% oxygen Intubation Type: IV induction Ventilation: Mask ventilation without difficulty LMA: LMA inserted LMA Size: 5.0 Number of attempts: 1 Placement Confirmation: breath sounds checked- equal and bilateral and positive ETCO2 Tube secured with: Tape Dental Injury: Teeth and Oropharynx as per pre-operative assessment

## 2012-11-29 NOTE — Discharge Summary (Signed)
Discharge to home in stable condition status post left great toe amputation.

## 2012-12-02 MED FILL — Mupirocin Oint 2%: CUTANEOUS | Qty: 22 | Status: AC

## 2012-12-03 ENCOUNTER — Encounter (HOSPITAL_COMMUNITY): Payer: Self-pay | Admitting: Orthopedic Surgery

## 2012-12-18 ENCOUNTER — Telehealth: Payer: Self-pay | Admitting: Family Medicine

## 2012-12-18 ENCOUNTER — Other Ambulatory Visit: Payer: Self-pay | Admitting: Medical

## 2012-12-18 MED ORDER — PAROXETINE HCL 40 MG PO TABS: 40.0000 mg | ORAL_TABLET | ORAL | Status: DC

## 2012-12-18 NOTE — Telephone Encounter (Signed)
Pt needs a refill on his paxil 40mg  [facility]

## 2012-12-18 NOTE — Telephone Encounter (Signed)
PT CAME IN WITH A RECEIPT FORM [employer] FOR A WALKER HE PURCHASED. HE PURCHASED THIS WALKER TO HELP HIM GET AROUND IN HIS HOUSE. HE IS REQUESTING THAT YOU WRITE A RX FOR THIS SO HE CAN FILE A CLAIM WITH MEDICARE TO GET SOME OF THE COST BACK. THE NAME OF THE WALKER IS A ROLLATOR WALKER. PLEASE MAIL TO PT WHEN COMPLETE.

## 2012-12-18 NOTE — Telephone Encounter (Signed)
pls write on script pad so I can sign

## 2012-12-20 NOTE — Telephone Encounter (Signed)
I mailed the patient the RX for the walker per his request. CLS

## 2012-12-25 ENCOUNTER — Telehealth: Payer: Self-pay | Admitting: Family Medicine

## 2012-12-25 MED ORDER — FUROSEMIDE 40 MG PO TABS: 40.0000 mg | ORAL_TABLET | Freq: Every day | ORAL | Status: DC

## 2012-12-25 NOTE — Telephone Encounter (Signed)
Lasix called in

## 2012-12-30 ENCOUNTER — Encounter (HOSPITAL_COMMUNITY): Payer: Self-pay | Admitting: Pharmacy Technician

## 2012-12-30 ENCOUNTER — Other Ambulatory Visit (HOSPITAL_COMMUNITY): Payer: Self-pay | Admitting: Orthopedic Surgery

## 2012-12-30 ENCOUNTER — Telehealth: Payer: Self-pay | Admitting: Family Medicine

## 2012-12-30 MED ORDER — HYDROXYZINE HCL 25 MG PO TABS: 25.0000 mg | ORAL_TABLET | Freq: Three times a day (TID) | ORAL | Status: DC | PRN

## 2012-12-30 NOTE — Telephone Encounter (Signed)
Needs refill on hydroxyzine 25mg    Sent to [facility]   With refills so he doesn't have to call every time

## 2012-12-31 ENCOUNTER — Telehealth: Payer: Self-pay | Admitting: Medical

## 2012-12-31 MED ORDER — GLIPIZIDE 5 MG PO TABS: 2.5000 mg | ORAL_TABLET | Freq: Every day | ORAL | Status: DC

## 2012-12-31 NOTE — Telephone Encounter (Signed)
Pt called and requested a refill of glipizide [facility]

## 2012-12-31 NOTE — Telephone Encounter (Signed)
Sent med in 

## 2013-01-01 ENCOUNTER — Telehealth: Payer: Self-pay | Admitting: Internal Medicine

## 2013-01-01 NOTE — Telephone Encounter (Signed)
Pt is calling to let you know that he is getting another toe amputated and just wanted to inform you. He is going to the same doctor as before

## 2013-01-02 ENCOUNTER — Encounter (HOSPITAL_COMMUNITY): Payer: Self-pay | Admitting: *Deleted

## 2013-01-02 MED ORDER — CEFAZOLIN SODIUM-DEXTROSE 2-3 GM-% IV SOLR
2.0000 g | INTRAVENOUS | Status: AC
  Administered 2013-01-03: 2 g via INTRAVENOUS
  Filled 2013-01-02: qty 50

## 2013-01-02 NOTE — Progress Notes (Addendum)
Pt denies SOB, chest pain, and being under the care of a cardiologist. Pt states that he had an stress test at [facility] " about a year or so ago." Results from stress test were requested along with any other cardiac studies and latest office notes. Pt states that he had surgery this pat June. Pt advised to Stop taking Aspirin and herbal medications Probiotic Product (HM 4X PROBIOTIC) TABS, Omega-3 Fatty Acids (FISH OIL) 1000 MG CAPS ,   Do not take any NSAIDs ie: Ibuprofen, Advil, Naproxen or any medication containing Aspirin.

## 2013-01-03 ENCOUNTER — Ambulatory Visit (HOSPITAL_COMMUNITY)
Admission: RE | Admit: 2013-01-03 | Discharge: 2013-01-03 | Disposition: A | Discharge status: Home / Self Care | Payer: Medicare Other | Source: Ambulatory Visit | Attending: Orthopedic Surgery | Admitting: Orthopedic Surgery

## 2013-01-03 ENCOUNTER — Encounter (HOSPITAL_COMMUNITY)
Admission: RE | Disposition: A | Discharge status: Home / Self Care | Payer: Self-pay | Source: Ambulatory Visit | Attending: Orthopedic Surgery

## 2013-01-03 ENCOUNTER — Encounter (HOSPITAL_COMMUNITY): Payer: Self-pay | Admitting: Anesthesiology

## 2013-01-03 ENCOUNTER — Ambulatory Visit (HOSPITAL_COMMUNITY): Payer: Medicare Other | Admitting: Anesthesiology

## 2013-01-03 ENCOUNTER — Encounter (HOSPITAL_COMMUNITY): Payer: Self-pay | Admitting: *Deleted

## 2013-01-03 DIAGNOSIS — Z885 Allergy status to narcotic agent status: Secondary | ICD-10-CM | POA: Insufficient documentation

## 2013-01-03 DIAGNOSIS — Z8711 Personal history of peptic ulcer disease: Secondary | ICD-10-CM | POA: Insufficient documentation

## 2013-01-03 DIAGNOSIS — E785 Hyperlipidemia, unspecified: Secondary | ICD-10-CM | POA: Insufficient documentation

## 2013-01-03 DIAGNOSIS — M129 Arthropathy, unspecified: Secondary | ICD-10-CM | POA: Insufficient documentation

## 2013-01-03 DIAGNOSIS — Z87891 Personal history of nicotine dependence: Secondary | ICD-10-CM | POA: Insufficient documentation

## 2013-01-03 DIAGNOSIS — L97509 Non-pressure chronic ulcer of other part of unspecified foot with unspecified severity: Secondary | ICD-10-CM | POA: Insufficient documentation

## 2013-01-03 DIAGNOSIS — I6529 Occlusion and stenosis of unspecified carotid artery: Secondary | ICD-10-CM | POA: Insufficient documentation

## 2013-01-03 DIAGNOSIS — I658 Occlusion and stenosis of other precerebral arteries: Secondary | ICD-10-CM | POA: Insufficient documentation

## 2013-01-03 DIAGNOSIS — N189 Chronic kidney disease, unspecified: Secondary | ICD-10-CM | POA: Insufficient documentation

## 2013-01-03 DIAGNOSIS — F411 Generalized anxiety disorder: Secondary | ICD-10-CM | POA: Insufficient documentation

## 2013-01-03 DIAGNOSIS — N400 Enlarged prostate without lower urinary tract symptoms: Secondary | ICD-10-CM | POA: Insufficient documentation

## 2013-01-03 DIAGNOSIS — Z888 Allergy status to other drugs, medicaments and biological substances status: Secondary | ICD-10-CM | POA: Insufficient documentation

## 2013-01-03 DIAGNOSIS — Z79899 Other long term (current) drug therapy: Secondary | ICD-10-CM | POA: Insufficient documentation

## 2013-01-03 DIAGNOSIS — M86172 Other acute osteomyelitis, left ankle and foot: Secondary | ICD-10-CM

## 2013-01-03 DIAGNOSIS — H409 Unspecified glaucoma: Secondary | ICD-10-CM | POA: Insufficient documentation

## 2013-01-03 DIAGNOSIS — J309 Allergic rhinitis, unspecified: Secondary | ICD-10-CM | POA: Insufficient documentation

## 2013-01-03 DIAGNOSIS — M109 Gout, unspecified: Secondary | ICD-10-CM | POA: Insufficient documentation

## 2013-01-03 DIAGNOSIS — E039 Hypothyroidism, unspecified: Secondary | ICD-10-CM | POA: Insufficient documentation

## 2013-01-03 DIAGNOSIS — E1169 Type 2 diabetes mellitus with other specified complication: Secondary | ICD-10-CM | POA: Insufficient documentation

## 2013-01-03 DIAGNOSIS — D638 Anemia in other chronic diseases classified elsewhere: Secondary | ICD-10-CM | POA: Insufficient documentation

## 2013-01-03 DIAGNOSIS — G4700 Insomnia, unspecified: Secondary | ICD-10-CM | POA: Insufficient documentation

## 2013-01-03 DIAGNOSIS — M869 Osteomyelitis, unspecified: Secondary | ICD-10-CM | POA: Insufficient documentation

## 2013-01-03 DIAGNOSIS — M9080 Osteopathy in diseases classified elsewhere, unspecified site: Secondary | ICD-10-CM | POA: Insufficient documentation

## 2013-01-03 DIAGNOSIS — Z85820 Personal history of malignant melanoma of skin: Secondary | ICD-10-CM | POA: Insufficient documentation

## 2013-01-03 DIAGNOSIS — I129 Hypertensive chronic kidney disease with stage 1 through stage 4 chronic kidney disease, or unspecified chronic kidney disease: Secondary | ICD-10-CM | POA: Insufficient documentation

## 2013-01-03 HISTORY — PX: AMPUTATION: SHX166

## 2013-01-03 LAB — GLUCOSE, CAPILLARY
Glucose-Capillary: 75 mg/dL (ref 70–99)
Glucose-Capillary: 63 mg/dL — ABNORMAL LOW (ref 70–99)
Glucose-Capillary: 70 mg/dL (ref 70–99)
Glucose-Capillary: 85 mg/dL (ref 70–99)

## 2013-01-03 LAB — CBC
HCT: 34.6 % — ABNORMAL LOW (ref 39.0–52.0)
Hemoglobin: 11.4 g/dL — ABNORMAL LOW (ref 13.0–17.0)
MCH: 31.5 pg (ref 26.0–34.0)
MCHC: 32.9 g/dL (ref 30.0–36.0)

## 2013-01-03 LAB — PROTIME-INR: Prothrombin Time: 13.8 seconds (ref 11.6–15.2)

## 2013-01-03 LAB — COMPREHENSIVE METABOLIC PANEL
Alkaline Phosphatase: 106 U/L (ref 39–117)
BUN: 47 mg/dL — ABNORMAL HIGH (ref 6–23)
CO2: 18 mEq/L — ABNORMAL LOW (ref 19–32)
Calcium: 9.5 mg/dL (ref 8.4–10.5)
GFR calc Af Amer: 46 mL/min — ABNORMAL LOW (ref >90)
GFR calc non Af Amer: 40 mL/min — ABNORMAL LOW (ref >90)
Glucose, Bld: 92 mg/dL (ref 70–99)
Total Protein: 7 g/dL (ref 6.0–8.3)

## 2013-01-03 SURGERY — AMPUTATION DIGIT
Anesthesia: General | Site: Toe | Laterality: Left | Wound class: Clean

## 2013-01-03 MED ORDER — HYDROMORPHONE HCL PF 1 MG/ML IJ SOLN: 0.2500 mg | INTRAMUSCULAR | Status: DC | PRN

## 2013-01-03 MED ORDER — PHENYLEPHRINE HCL 10 MG/ML IJ SOLN
INTRAMUSCULAR | Status: DC | PRN
  Administered 2013-01-03: 80 ug via INTRAVENOUS

## 2013-01-03 MED ORDER — METOCLOPRAMIDE HCL 5 MG/ML IJ SOLN: 10.0000 mg | Freq: Once | INTRAMUSCULAR | Status: DC | PRN

## 2013-01-03 MED ORDER — DEXTROSE 5 % IV SOLN
INTRAVENOUS | Status: DC
  Administered 2013-01-03: 13:00:00 via INTRAVENOUS

## 2013-01-03 MED ORDER — ONDANSETRON HCL 4 MG/2ML IJ SOLN
INTRAMUSCULAR | Status: DC | PRN
  Administered 2013-01-03: 4 mg via INTRAVENOUS

## 2013-01-03 MED ORDER — LIDOCAINE HCL (CARDIAC) 20 MG/ML IV SOLN
INTRAVENOUS | Status: DC | PRN
  Administered 2013-01-03: 100 mg via INTRAVENOUS

## 2013-01-03 MED ORDER — BUPIVACAINE HCL (PF) 0.5 % IJ SOLN
INTRAMUSCULAR | Status: DC | PRN
  Administered 2013-01-03: 10 mL

## 2013-01-03 MED ORDER — BUPIVACAINE HCL (PF) 0.5 % IJ SOLN
INTRAMUSCULAR | Status: AC
  Filled 2013-01-03: qty 30

## 2013-01-03 MED ORDER — OXYCODONE HCL 5 MG PO TABS: 5.0000 mg | ORAL_TABLET | Freq: Once | ORAL | Status: DC | PRN

## 2013-01-03 MED ORDER — FENTANYL CITRATE 0.05 MG/ML IJ SOLN
INTRAMUSCULAR | Status: DC | PRN
  Administered 2013-01-03: 100 ug via INTRAVENOUS

## 2013-01-03 MED ORDER — PROPOFOL 10 MG/ML IV BOLUS
INTRAVENOUS | Status: DC | PRN
  Administered 2013-01-03: 130 mg via INTRAVENOUS

## 2013-01-03 MED ORDER — OXYCODONE HCL 5 MG/5ML PO SOLN: 5.0000 mg | Freq: Once | ORAL | Status: DC | PRN

## 2013-01-03 MED ORDER — 0.9 % SODIUM CHLORIDE (POUR BTL) OPTIME
TOPICAL | Status: DC | PRN
  Administered 2013-01-03: 1000 mL

## 2013-01-03 MED ORDER — LACTATED RINGERS IV SOLN
INTRAVENOUS | Status: DC | PRN
  Administered 2013-01-03: 11:00:00 via INTRAVENOUS

## 2013-01-03 MED ORDER — EPHEDRINE SULFATE 50 MG/ML IJ SOLN
INTRAMUSCULAR | Status: DC | PRN
  Administered 2013-01-03 (×2): 10 mg via INTRAVENOUS

## 2013-01-03 SURGICAL SUPPLY — 32 items
BANDAGE GAUZE ELAST BULKY 4 IN (GAUZE/BANDAGES/DRESSINGS) ×1 IMPLANT
BNDG CMPR 9X4 STRL LF SNTH (GAUZE/BANDAGES/DRESSINGS)
BNDG COHESIVE 4X5 TAN STRL (GAUZE/BANDAGES/DRESSINGS) ×1 IMPLANT
BNDG ESMARK 4X9 LF (GAUZE/BANDAGES/DRESSINGS) IMPLANT
CLOTH BEACON ORANGE TIMEOUT ST (SAFETY) ×2 IMPLANT
COVER SURGICAL LIGHT HANDLE (MISCELLANEOUS) ×2 IMPLANT
DRAPE U-SHAPE 47X51 STRL (DRAPES) ×2 IMPLANT
DRSG ADAPTIC 3X8 NADH LF (GAUZE/BANDAGES/DRESSINGS) IMPLANT
DRSG EMULSION OIL 3X3 NADH (GAUZE/BANDAGES/DRESSINGS) ×1 IMPLANT
DURAPREP 26ML APPLICATOR (WOUND CARE) ×2 IMPLANT
ELECT REM PT RETURN 9FT ADLT (ELECTROSURGICAL) ×2
ELECTRODE REM PT RTRN 9FT ADLT (ELECTROSURGICAL) ×1 IMPLANT
GLOVE BIOGEL PI IND STRL 9 (GLOVE) ×1 IMPLANT
GLOVE BIOGEL PI INDICATOR 9 (GLOVE) ×1
GLOVE SURG ORTHO 9.0 STRL STRW (GLOVE) ×2 IMPLANT
GOWN PREVENTION PLUS XLARGE (GOWN DISPOSABLE) ×2 IMPLANT
GOWN SRG XL XLNG 56XLVL 4 (GOWN DISPOSABLE) ×1 IMPLANT
GOWN STRL NON-REIN XL XLG LVL4 (GOWN DISPOSABLE) ×2
KIT BASIN OR (CUSTOM PROCEDURE TRAY) ×2 IMPLANT
KIT ROOM TURNOVER OR (KITS) ×2 IMPLANT
MANIFOLD NEPTUNE II (INSTRUMENTS) ×1 IMPLANT
NEEDLE 22X1 1/2 (OR ONLY) (NEEDLE) ×1 IMPLANT
NS IRRIG 1000ML POUR BTL (IV SOLUTION) ×2 IMPLANT
PACK ORTHO EXTREMITY (CUSTOM PROCEDURE TRAY) ×2 IMPLANT
PAD ARMBOARD 7.5X6 YLW CONV (MISCELLANEOUS) ×4 IMPLANT
SPONGE GAUZE 4X4 12PLY (GAUZE/BANDAGES/DRESSINGS) ×1 IMPLANT
SUCTION FRAZIER TIP 10 FR DISP (SUCTIONS) ×1 IMPLANT
SUT ETHILON 2 0 PSLX (SUTURE) ×3 IMPLANT
SYR CONTROL 10ML LL (SYRINGE) ×1 IMPLANT
TOWEL OR 17X24 6PK STRL BLUE (TOWEL DISPOSABLE) ×2 IMPLANT
TOWEL OR 17X26 10 PK STRL BLUE (TOWEL DISPOSABLE) ×2 IMPLANT
TUBE CONNECTING 12X1/4 (SUCTIONS) ×1 IMPLANT

## 2013-01-03 NOTE — Anesthesia Postprocedure Evaluation (Signed)
Anesthesia Post Note  Patient: Zachary Vargas  Procedure(s) Performed: Procedure(s) (LRB): Left foot 2nd toe amputation at metatarsophalangeal (Left)  Anesthesia type: General  Patient location: PACU  Post pain: Pain level controlled  Post assessment: Patient's Cardiovascular Status Stable  Last Vitals:  Filed Vitals:   01/03/13 1415  BP: 135/68  Pulse: 65  Temp: 36.4 C  Resp: 16    Post vital signs: Reviewed and stable  Level of consciousness: alert  Complications: No apparent anesthesia complications

## 2013-01-03 NOTE — Preoperative (Signed)
Beta Blockers   Reason not to administer Beta Blockers:Not Applicable 

## 2013-01-03 NOTE — Anesthesia Procedure Notes (Signed)
Procedure Name: LMA Insertion Date/Time: 01/03/2013 1:04 PM Performed by: Leona Singleton A Pre-anesthesia Checklist: Patient identified, Emergency Drugs available, Suction available and Patient being monitored Patient Re-evaluated:Patient Re-evaluated prior to inductionOxygen Delivery Method: Circle system utilized Preoxygenation: Pre-oxygenation with 100% oxygen Intubation Type: IV induction LMA: LMA inserted LMA Size: 4.0 Tube type: Oral Number of attempts: 1 Placement Confirmation: positive ETCO2 and breath sounds checked- equal and bilateral Tube secured with: Tape Dental Injury: Teeth and Oropharynx as per pre-operative assessment

## 2013-01-03 NOTE — Op Note (Signed)
OPERATIVE REPORT  DATE OF SURGERY: 01/03/2013  PATIENT:  Zachary Vargas,  79 y.o. male  PRE-OPERATIVE DIAGNOSIS:  osteomyelitis left foot 2nd toe  POST-OPERATIVE DIAGNOSIS:  osteomyelitis left foot 2nd toe  PROCEDURE:  Procedure(s): Left foot 2nd toe amputation at metatarsophalangeal  SURGEON:  Surgeon(s): Nadara Mustard, MD  ANESTHESIA:   general  EBL:  Min  ML  SPECIMEN:  No Specimen  TOURNIQUET:  * No tourniquets in log *  PROCEDURE DETAILS: Patient is an 79 year old gentleman who presents with osteomyelitis ulceration exposed bone left foot second toe. He has failed conservative care presents at this time for amputation of the left foot second toe. Risks and benefits were discussed including risk of nonhealing potential for additional surgery. Patient states he understands and wishes to proceed at this time. Description of procedure patient brought to the operating room underwent a general anesthetic. After adequate levels and anesthesia obtained patient's left lower extremity was prepped using DuraPrep and draped into a sterile field. A fishmouth incision was made just distal to the MTP joint. The toe was amputated through the MTP joint without complications. Was irrigated with normal saline hemostasis was obtained. The incision was closed using 2-0 nylon. The wound is covered with Adaptic orthopedic sponges AB dressing Kerlix and Coban. Patient was extubated taken to the PACU in stable condition.  PLAN OF CARE: Discharge to home after PACU  PATIENT DISPOSITION:  PACU - hemodynamically stable.   Nadara Mustard, MD 01/03/2013 1:31 PM

## 2013-01-03 NOTE — Anesthesia Preprocedure Evaluation (Signed)
Anesthesia Evaluation  Patient identified by MRN, date of birth, ID band Patient awake    Reviewed: Allergy & Precautions, H&P , NPO status , Patient's Chart, lab work & pertinent test results, reviewed documented beta blocker date and time   Airway Mallampati: II TM Distance: >3 FB Neck ROM: full    Dental   Pulmonary resolved,  breath sounds clear to auscultation        Cardiovascular hypertension, On Medications and On Home Beta Blockers + Peripheral Vascular Disease Rhythm:regular     Neuro/Psych  Headaches, PSYCHIATRIC DISORDERS    GI/Hepatic Neg liver ROS, PUD,   Endo/Other  negative endocrine ROSdiabetes, Oral Hypoglycemic AgentsHypothyroidism   Renal/GU Renal InsufficiencyRenal disease  negative genitourinary   Musculoskeletal   Abdominal   Peds  Hematology  (+) anemia ,   Anesthesia Other Findings See surgeon's H&P   Reproductive/Obstetrics negative OB ROS                           Anesthesia Physical Anesthesia Plan  ASA: III  Anesthesia Plan: General   Post-op Pain Management:    Induction: Intravenous  Airway Management Planned: LMA  Additional Equipment:   Intra-op Plan:   Post-operative Plan:   Informed Consent: I have reviewed the patients History and Physical, chart, labs and discussed the procedure including the risks, benefits and alternatives for the proposed anesthesia with the patient or authorized representative who has indicated his/her understanding and acceptance.   Dental Advisory Given  Plan Discussed with: CRNA and Surgeon  Anesthesia Plan Comments:         Anesthesia Quick Evaluation

## 2013-01-03 NOTE — Transfer of Care (Signed)
Immediate Anesthesia Transfer of Care Note  Patient: Zachary Vargas  Procedure(s) Performed: Procedure(s): Left foot 2nd toe amputation at metatarsophalangeal (Left)  Patient Location: PACU  Anesthesia Type:General  Level of Consciousness: awake, alert , oriented and patient cooperative  Airway & Oxygen Therapy: Patient Spontanous Breathing and Patient connected to nasal cannula oxygen  Post-op Assessment: Report given to PACU RN, Post -op Vital signs reviewed and stable and Patient moving all extremities X 4  Post vital signs: Reviewed and stable  Complications: No apparent anesthesia complications

## 2013-01-03 NOTE — H&P (Signed)
Zachary Vargas is an 79 y.o. male.   Chief Complaint: Osteomyelitis ulceration left foot second toe HPI: Patient is an 79 year old gentleman with diabetes peripheral vascular disease who presents with sausage digit swelling of the left foot second toe with ulceration and osteomyelitis.  Past Medical History  Diagnosis Date  . Glaucoma   . History of aortic aneurysm repair   . History of renal stone   . Dyslipidemia     statin intolerant  . Colon polyps   . Melanoma in situ     History of melanoma left back - Dr. Jorja Loa  . Gout     takes Colcrys bid  . Allergy   . BPH (benign prostatic hyperplasia)     [facility]  . History of peptic ulcer disease   . Peptic ulcer disease   . Diverticulosis   . Lumbar spinal stenosis     Most recent MRI 12/ 2011; [facility] - Dr. Ethelene Hal  . Pulmonary disease     Fibrotic parenchymal changes per 5/11 x-ray  . Carotid atherosclerosis     Dopplers 11/09 showing 50-69% stenosis  . Peripheral arterial disease     [place] health care consult 1/08  . Epigastric pain     Hospitalization 7/11 [place]  . Arthritis     [facility]  . Myeloma     IgA related; Dr. Myna Hidalgo - 2009  . Chronic kidney disease     2/08 renal artery ultrasound without significant stenosis; prior consult [place] [facility] consult  . Hypertension     takes Lisinopril daily  . Pneumonia 2006    hospitalized  . Headache(784.0)     rarely  . Eczema   . Constipation     d/t pain meds  . Urinary frequency   . Diabetes mellitus     takes glipizide daily  . Glaucoma   . Anxiety     takes Paxil daily  . Insomnia     takes Lunesta nightly  . Hypothyroidism     takes Synthroid daily  . Anemia     of chronic disease;takes Ferrous Gluconate daily    Past Surgical History  Procedure Laterality Date  . Abdominal aortic aneurysm repair  03/1998  . Abdominal hernia repair  2000  . Renal stone surgery  1999  . Nerve root block      Multiple for lumbar  spine  . Colonoscopy  07/22/2004    due repeat 2011; Dr. Dorena Cookey, [facility]  . Eye surgery      bil cataract surgery  . Amputation Left 11/29/2012    Procedure: Left Foot, Great Toe Amputation at MTP Joint;  Surgeon: Nadara Mustard, MD;  Location: [facility] OR;  Service: Orthopedics;  Laterality: Left;  LEFT  Foot, Great Toe Amputation at MTP Joint    Family History  Problem Relation Age of Onset  . Diabetes Father   . Glaucoma Father   . Cancer Brother     1 died of lung cancer, 1 of colon, 1 of different cancer  . Glaucoma Daughter   . Glaucoma Son    Social History:  reports that he has quit smoking. He has never used smokeless tobacco. He reports that  drinks alcohol. He reports that he does not use illicit drugs.  Allergies:  Allergies  Allergen Reactions  . Morphine And Related Shortness Of Breath    hives  . Statins     Hives; pt notes that "all" classes of cholesterol  meds do the same   . Allopurinol Hives, Swelling and Rash    No prescriptions prior to admission    No results found for this or any previous visit (from the past 48 hour(s)). No results found.  Review of Systems  All other systems reviewed and are negative.    Height 6\' 3"  (1.905 m), weight 111.131 kg (245 lb). Physical Exam  On examination patient has sausage digit swelling of the left foot second toe ulcer over the tip of the toe which probes down to bone. Assessment/Plan Assessment: Ulceration cellulitis left my was left foot second toe.  Plan: We'll plan for left foot second toe amputation at the MTP joint. Risks and benefits were discussed including infection nonhealing of the wound need for additional surgery. Patient states he understands and wished to proceed at this time.  DUDA,MARCUS V 01/03/2013, 6:37 AM

## 2013-01-06 ENCOUNTER — Telehealth: Payer: Self-pay | Admitting: Medical

## 2013-01-06 MED ORDER — GLUCOSE BLOOD VI STRP: ORAL_STRIP | Status: DC

## 2013-01-06 MED ORDER — AUTO-LANCET MISC: Status: DC

## 2013-01-06 MED ORDER — GLIPIZIDE 5 MG PO TABS: 2.5000 mg | ORAL_TABLET | Freq: Every day | ORAL | Status: DC

## 2013-01-06 NOTE — Telephone Encounter (Signed)
Pt needs refills on glipiside and strips and [facility]ets [facility]

## 2013-01-06 NOTE — Telephone Encounter (Signed)
done

## 2013-01-07 ENCOUNTER — Encounter (HOSPITAL_COMMUNITY): Payer: Self-pay | Admitting: Orthopedic Surgery

## 2013-01-13 ENCOUNTER — Telehealth: Payer: Self-pay | Admitting: Internal Medicine

## 2013-01-13 NOTE — Telephone Encounter (Signed)
Sorry to hear about the additional amputations.  Has he not discussed these symptoms and pain control with the surgeon?  I would think they would initially try some medications to help.   I would have him ask the surgeon about steps to take for pain control and get their input first.

## 2013-01-13 NOTE — Telephone Encounter (Signed)
Patient called and said he had more toes removes 1 week ago and he went for the follow up and the doctor said that he wanted him to stay off of his feet one more week so he can't make it in for a OV. His right foot feels like there is pin's sticking him in his foot. It has gotten worse over the next 3 to 4 weeks. He would like to know is there anything that you can call out for him to help with that. CLS

## 2013-01-13 NOTE — Telephone Encounter (Signed)
Patient is aware and he will check with Dr. Lajoyce Corners, CLS

## 2013-01-13 NOTE — Telephone Encounter (Signed)
Pt would like you to give him a call. Pt just had surgery and he is having some pain i believe and would like you to call him

## 2013-01-14 ENCOUNTER — Telehealth: Payer: Self-pay | Admitting: Medical

## 2013-01-14 NOTE — Telephone Encounter (Signed)
Please call  Patient would just like to talk to you for a few minutes. He states "wires got crossed"  On his previous message and he is not asking for pain meds. He just wants to speak with Zachary Vargas

## 2013-01-14 NOTE — Telephone Encounter (Signed)
Please let him know that I didn't assume that he was wanting pain medicaoitn, but medications we sometimes use for pins and needles are medications such as Gabapentin, Lyrica, or Cymbalta.  We could begin one of these medications to help, but his surgeon that did the amputation may prefer one of the medications over the other.     Go over this with him, then you can get me on the phone.

## 2013-01-14 NOTE — Telephone Encounter (Signed)
Please call  Patient would just like to talk to you for a few minutes. He states "wires got crossed" and he is not asking for pain meds

## 2013-01-15 NOTE — Telephone Encounter (Signed)
Patient was made aware of what Crosby Oyster PA-C recommended. CLS He said he will get back with [place] or make an appointment. CLS

## 2013-02-12 ENCOUNTER — Telehealth: Payer: Self-pay | Admitting: Family Medicine

## 2013-02-12 NOTE — Telephone Encounter (Signed)
Pt called and states he need Zolpidem for sleep called into [facility] mail order.  Please give refills per pt

## 2013-02-12 NOTE — Telephone Encounter (Signed)
This was to go to you 

## 2013-02-12 NOTE — Telephone Encounter (Signed)
Received refill request for Ambien, but latest chart record shows Dr. Susann Givens refilled Alfonso Patten in May for 90 days.   I assume this refill is suppose to be for lunesta correct?

## 2013-02-13 NOTE — Telephone Encounter (Signed)
Clayton said that it is suppose to be for Ambien not Lunesta. He said Alfonso Patten does not work for him. He wants a refill on Ambien. CLS

## 2013-02-14 ENCOUNTER — Other Ambulatory Visit: Payer: Self-pay | Admitting: Medical

## 2013-02-14 MED ORDER — ZOLPIDEM TARTRATE 10 MG PO TABS: 10.0000 mg | ORAL_TABLET | Freq: Every evening | ORAL | Status: DC | PRN

## 2013-02-14 NOTE — Telephone Encounter (Signed)
Call in zolpidem 10mg  #90 to mail order for sleep

## 2013-02-14 NOTE — Telephone Encounter (Signed)
I fax over the Rx to his pharmacy because they would not let me call it in. CLS

## 2013-03-07 ENCOUNTER — Other Ambulatory Visit: Payer: Self-pay | Admitting: Medical

## 2013-03-14 ENCOUNTER — Encounter (HOSPITAL_COMMUNITY): Payer: Self-pay | Admitting: Pharmacy Technician

## 2013-03-17 ENCOUNTER — Other Ambulatory Visit (HOSPITAL_COMMUNITY): Payer: Self-pay | Admitting: Orthopedic Surgery

## 2013-03-18 ENCOUNTER — Encounter (HOSPITAL_COMMUNITY): Payer: Self-pay | Admitting: *Deleted

## 2013-03-18 MED ORDER — CEFAZOLIN SODIUM-DEXTROSE 2-3 GM-% IV SOLR: 2.0000 g | INTRAVENOUS | Status: DC

## 2013-03-19 ENCOUNTER — Ambulatory Visit (HOSPITAL_COMMUNITY): Payer: Medicare Other | Admitting: Anesthesiology

## 2013-03-19 ENCOUNTER — Encounter (HOSPITAL_COMMUNITY): Payer: Medicare Other | Admitting: Anesthesiology

## 2013-03-19 ENCOUNTER — Ambulatory Visit (HOSPITAL_COMMUNITY)
Admission: RE | Admit: 2013-03-19 | Discharge: 2013-03-19 | Disposition: A | Discharge status: Home / Self Care | Payer: Medicare Other | Source: Ambulatory Visit | Attending: Orthopedic Surgery | Admitting: Orthopedic Surgery

## 2013-03-19 ENCOUNTER — Encounter (HOSPITAL_COMMUNITY): Payer: Self-pay | Admitting: Anesthesiology

## 2013-03-19 ENCOUNTER — Encounter: Payer: Self-pay | Admitting: Medical

## 2013-03-19 ENCOUNTER — Encounter (HOSPITAL_COMMUNITY)
Admission: RE | Disposition: A | Discharge status: Home / Self Care | Payer: Self-pay | Source: Ambulatory Visit | Attending: Orthopedic Surgery

## 2013-03-19 DIAGNOSIS — M869 Osteomyelitis, unspecified: Secondary | ICD-10-CM | POA: Insufficient documentation

## 2013-03-19 DIAGNOSIS — F411 Generalized anxiety disorder: Secondary | ICD-10-CM | POA: Insufficient documentation

## 2013-03-19 DIAGNOSIS — N189 Chronic kidney disease, unspecified: Secondary | ICD-10-CM | POA: Insufficient documentation

## 2013-03-19 DIAGNOSIS — Z01812 Encounter for preprocedural laboratory examination: Secondary | ICD-10-CM | POA: Insufficient documentation

## 2013-03-19 DIAGNOSIS — M9080 Osteopathy in diseases classified elsewhere, unspecified site: Secondary | ICD-10-CM | POA: Insufficient documentation

## 2013-03-19 DIAGNOSIS — M129 Arthropathy, unspecified: Secondary | ICD-10-CM | POA: Insufficient documentation

## 2013-03-19 DIAGNOSIS — M109 Gout, unspecified: Secondary | ICD-10-CM | POA: Insufficient documentation

## 2013-03-19 DIAGNOSIS — I6529 Occlusion and stenosis of unspecified carotid artery: Secondary | ICD-10-CM | POA: Insufficient documentation

## 2013-03-19 DIAGNOSIS — E1149 Type 2 diabetes mellitus with other diabetic neurological complication: Secondary | ICD-10-CM | POA: Insufficient documentation

## 2013-03-19 DIAGNOSIS — I129 Hypertensive chronic kidney disease with stage 1 through stage 4 chronic kidney disease, or unspecified chronic kidney disease: Secondary | ICD-10-CM | POA: Insufficient documentation

## 2013-03-19 DIAGNOSIS — E785 Hyperlipidemia, unspecified: Secondary | ICD-10-CM | POA: Insufficient documentation

## 2013-03-19 DIAGNOSIS — F3289 Other specified depressive episodes: Secondary | ICD-10-CM | POA: Insufficient documentation

## 2013-03-19 DIAGNOSIS — K5730 Diverticulosis of large intestine without perforation or abscess without bleeding: Secondary | ICD-10-CM | POA: Insufficient documentation

## 2013-03-19 DIAGNOSIS — C9000 Multiple myeloma not having achieved remission: Secondary | ICD-10-CM | POA: Insufficient documentation

## 2013-03-19 DIAGNOSIS — L97509 Non-pressure chronic ulcer of other part of unspecified foot with unspecified severity: Secondary | ICD-10-CM | POA: Insufficient documentation

## 2013-03-19 DIAGNOSIS — E1169 Type 2 diabetes mellitus with other specified complication: Secondary | ICD-10-CM | POA: Insufficient documentation

## 2013-03-19 DIAGNOSIS — I739 Peripheral vascular disease, unspecified: Secondary | ICD-10-CM | POA: Insufficient documentation

## 2013-03-19 DIAGNOSIS — E039 Hypothyroidism, unspecified: Secondary | ICD-10-CM | POA: Insufficient documentation

## 2013-03-19 DIAGNOSIS — G4700 Insomnia, unspecified: Secondary | ICD-10-CM | POA: Insufficient documentation

## 2013-03-19 DIAGNOSIS — H409 Unspecified glaucoma: Secondary | ICD-10-CM | POA: Insufficient documentation

## 2013-03-19 DIAGNOSIS — Z8711 Personal history of peptic ulcer disease: Secondary | ICD-10-CM | POA: Insufficient documentation

## 2013-03-19 DIAGNOSIS — Z860100 Personal history of colon polyps, unspecified: Secondary | ICD-10-CM | POA: Insufficient documentation

## 2013-03-19 DIAGNOSIS — G610 Guillain-Barre syndrome: Secondary | ICD-10-CM | POA: Insufficient documentation

## 2013-03-19 DIAGNOSIS — Z85828 Personal history of other malignant neoplasm of skin: Secondary | ICD-10-CM | POA: Insufficient documentation

## 2013-03-19 DIAGNOSIS — F329 Major depressive disorder, single episode, unspecified: Secondary | ICD-10-CM | POA: Insufficient documentation

## 2013-03-19 DIAGNOSIS — Z87442 Personal history of urinary calculi: Secondary | ICD-10-CM | POA: Insufficient documentation

## 2013-03-19 HISTORY — PX: AMPUTATION: SHX166

## 2013-03-19 HISTORY — DX: Osteomyelitis, unspecified: M86.9

## 2013-03-19 LAB — CBC
HCT: 31 % — ABNORMAL LOW (ref 39.0–52.0)
Hemoglobin: 10.6 g/dL — ABNORMAL LOW (ref 13.0–17.0)
MCHC: 34.2 g/dL (ref 30.0–36.0)
Platelets: 182 10*3/uL (ref 150–400)
RBC: 3.32 MIL/uL — ABNORMAL LOW (ref 4.22–5.81)
RDW: 14.6 % (ref 11.5–15.5)
WBC: 6.4 10*3/uL (ref 4.0–10.5)

## 2013-03-19 LAB — APTT: aPTT: 26 seconds (ref 24–37)

## 2013-03-19 LAB — COMPREHENSIVE METABOLIC PANEL
ALT: 25 U/L (ref 0–53)
Albumin: 3.6 g/dL (ref 3.5–5.2)
Alkaline Phosphatase: 94 U/L (ref 39–117)
BUN: 67 mg/dL — ABNORMAL HIGH (ref 6–23)
CO2: 21 mEq/L (ref 19–32)
Chloride: 107 mEq/L (ref 96–112)
Glucose, Bld: 120 mg/dL — ABNORMAL HIGH (ref 70–99)
Potassium: 5.1 mEq/L (ref 3.5–5.1)
Sodium: 139 mEq/L (ref 135–145)
Total Bilirubin: 0.4 mg/dL (ref 0.3–1.2)

## 2013-03-19 LAB — PROTIME-INR: Prothrombin Time: 13.6 seconds (ref 11.6–15.2)

## 2013-03-19 LAB — GLUCOSE, CAPILLARY: Glucose-Capillary: 103 mg/dL — ABNORMAL HIGH (ref 70–99)

## 2013-03-19 SURGERY — AMPUTATION DIGIT
Anesthesia: General | Site: Foot | Laterality: Left | Wound class: Contaminated

## 2013-03-19 MED ORDER — HYDROMORPHONE HCL PF 1 MG/ML IJ SOLN: 0.2500 mg | INTRAMUSCULAR | Status: DC | PRN

## 2013-03-19 MED ORDER — LIDOCAINE HCL (CARDIAC) 20 MG/ML IV SOLN
INTRAVENOUS | Status: DC | PRN
  Administered 2013-03-19: 40 mg via INTRAVENOUS

## 2013-03-19 MED ORDER — FENTANYL CITRATE 0.05 MG/ML IJ SOLN
INTRAMUSCULAR | Status: DC | PRN
  Administered 2013-03-19: 100 ug via INTRAVENOUS

## 2013-03-19 MED ORDER — CEFAZOLIN SODIUM-DEXTROSE 2-3 GM-% IV SOLR
INTRAVENOUS | Status: AC
  Administered 2013-03-19: 2 g via INTRAVENOUS
  Filled 2013-03-19: qty 50

## 2013-03-19 MED ORDER — PROPOFOL 10 MG/ML IV BOLUS
INTRAVENOUS | Status: DC | PRN
  Administered 2013-03-19: 50 mg via INTRAVENOUS

## 2013-03-19 MED ORDER — SODIUM CHLORIDE 0.9 % IV SOLN
INTRAVENOUS | Status: DC
  Administered 2013-03-19: 10:00:00 via INTRAVENOUS

## 2013-03-19 MED ORDER — PROPOFOL INFUSION 10 MG/ML OPTIME
INTRAVENOUS | Status: DC | PRN
  Administered 2013-03-19: 100 ug/kg/min via INTRAVENOUS

## 2013-03-19 MED ORDER — 0.9 % SODIUM CHLORIDE (POUR BTL) OPTIME
TOPICAL | Status: DC | PRN
  Administered 2013-03-19: 1000 mL

## 2013-03-19 MED ORDER — ONDANSETRON HCL 4 MG/2ML IJ SOLN: 4.0000 mg | Freq: Once | INTRAMUSCULAR | Status: DC | PRN

## 2013-03-19 MED ORDER — SODIUM CHLORIDE 0.9 % IV SOLN
INTRAVENOUS | Status: DC | PRN
  Administered 2013-03-19: 10:00:00 via INTRAVENOUS

## 2013-03-19 SURGICAL SUPPLY — 35 items
BANDAGE GAUZE ELAST BULKY 4 IN (GAUZE/BANDAGES/DRESSINGS) ×1 IMPLANT
BNDG CMPR 9X4 STRL LF SNTH (GAUZE/BANDAGES/DRESSINGS)
BNDG COHESIVE 6X5 TAN STRL LF (GAUZE/BANDAGES/DRESSINGS) ×1 IMPLANT
BNDG ESMARK 4X9 LF (GAUZE/BANDAGES/DRESSINGS) IMPLANT
CLOTH BEACON ORANGE TIMEOUT ST (SAFETY) ×2 IMPLANT
COVER SURGICAL LIGHT HANDLE (MISCELLANEOUS) ×2 IMPLANT
DRAPE U-SHAPE 47X51 STRL (DRAPES) ×2 IMPLANT
DRSG ADAPTIC 3X8 NADH LF (GAUZE/BANDAGES/DRESSINGS) ×1 IMPLANT
DRSG PAD ABDOMINAL 8X10 ST (GAUZE/BANDAGES/DRESSINGS) ×1 IMPLANT
DURAPREP 26ML APPLICATOR (WOUND CARE) ×2 IMPLANT
ELECT REM PT RETURN 9FT ADLT (ELECTROSURGICAL) ×2
ELECTRODE REM PT RTRN 9FT ADLT (ELECTROSURGICAL) ×1 IMPLANT
GLOVE BIO SURGEON STRL SZ 6.5 (GLOVE) ×1 IMPLANT
GLOVE BIOGEL PI IND STRL 6.5 (GLOVE) IMPLANT
GLOVE BIOGEL PI IND STRL 9 (GLOVE) ×1 IMPLANT
GLOVE BIOGEL PI INDICATOR 6.5 (GLOVE) ×4
GLOVE BIOGEL PI INDICATOR 9 (GLOVE) ×1
GLOVE SURG ORTHO 9.0 STRL STRW (GLOVE) ×2 IMPLANT
GOWN PREVENTION PLUS XLARGE (GOWN DISPOSABLE) ×2 IMPLANT
GOWN SRG XL XLNG 56XLVL 4 (GOWN DISPOSABLE) ×1 IMPLANT
GOWN STRL NON-REIN XL XLG LVL4 (GOWN DISPOSABLE) ×2
KIT BASIN OR (CUSTOM PROCEDURE TRAY) ×2 IMPLANT
KIT ROOM TURNOVER OR (KITS) ×2 IMPLANT
MANIFOLD NEPTUNE II (INSTRUMENTS) ×2 IMPLANT
NEEDLE 22X1 1/2 (OR ONLY) (NEEDLE) IMPLANT
NS IRRIG 1000ML POUR BTL (IV SOLUTION) ×2 IMPLANT
PACK ORTHO EXTREMITY (CUSTOM PROCEDURE TRAY) ×2 IMPLANT
PAD ARMBOARD 7.5X6 YLW CONV (MISCELLANEOUS) ×4 IMPLANT
SPONGE GAUZE 4X4 12PLY (GAUZE/BANDAGES/DRESSINGS) ×1 IMPLANT
SUCTION FRAZIER TIP 10 FR DISP (SUCTIONS) IMPLANT
SUT ETHILON 2 0 PSLX (SUTURE) ×2 IMPLANT
SYR CONTROL 10ML LL (SYRINGE) IMPLANT
TOWEL OR 17X24 6PK STRL BLUE (TOWEL DISPOSABLE) ×2 IMPLANT
TOWEL OR 17X26 10 PK STRL BLUE (TOWEL DISPOSABLE) ×2 IMPLANT
TUBE CONNECTING 12X1/4 (SUCTIONS) IMPLANT

## 2013-03-19 NOTE — Op Note (Signed)
OPERATIVE REPORT  DATE OF SURGERY: 03/19/2013  PATIENT:  Zachary Vargas,  79 y.o. male  PRE-OPERATIVE DIAGNOSIS:  Osteomyelitis Left foot 3rd toe  POST-OPERATIVE DIAGNOSIS:  Osteomyelitis Left foot 3rd toe  PROCEDURE:  Procedure(s): AMPUTATION DIGITS Left foot third fourth and fifth toes at the MTP joint.  SURGEON:  Surgeon(s): Nadara Mustard, MD  ANESTHESIA:   regional  EBL:  min ML  SPECIMEN:  No Specimen  TOURNIQUET:  * No tourniquets in log *  PROCEDURE DETAILS: Patient is an 79 year old gentleman with diabetic insensate neuropathy status post amputation of the left foot great toe and second toe. Patient developed.myelitis of the third toe and presents at this time for the above-mentioned procedure. Risks and benefits were discussed including infection nonhealing of the wound need for additional surgery. Patient states he understands and wishes to proceed at this time. Description of procedure patient brought to the operating room after undergoing an ankle block. After adequate levels and anesthesia obtained patient's left lower extremity was prepped using DuraPrep and draped into a sterile field. A transverse incision was made the base of the third fourth and fifth toes. These toes were amputated through the MTP joint. Wound was irrigated with normal saline hemostasis was obtained. The incision was closed using 2-0 nylon. The wound is covered with Adaptic orthopedic sponges ABDs dressing Kerlix and Coban. Patient was extubated taken to the PACU in stable condition.  PLAN OF CARE: Discharge to home after PACU  PATIENT DISPOSITION:  PACU - hemodynamically stable.   Nadara Mustard, MD 03/19/2013 10:26 AM

## 2013-03-19 NOTE — Anesthesia Preprocedure Evaluation (Signed)
Anesthesia Evaluation  Patient identified by MRN, date of birth, ID band Patient awake    Reviewed: Allergy & Precautions, H&P , Patient's Chart, lab work & pertinent test results  Airway       Dental   Pulmonary          Cardiovascular hypertension, + Peripheral Vascular Disease     Neuro/Psych  Headaches, Anxiety Depression    GI/Hepatic PUD,   Endo/Other  diabetes, Type 2Hypothyroidism   Renal/GU Renal disease     Musculoskeletal   Abdominal   Peds  Hematology  (+) Blood dyscrasia, anemia ,   Anesthesia Other Findings   Reproductive/Obstetrics                           Anesthesia Physical Anesthesia Plan  ASA: III  Anesthesia Plan:    Post-op Pain Management:    Induction: Intravenous  Airway Management Planned:   Additional Equipment:   Intra-op Plan:   Post-operative Plan:   Informed Consent:   Plan Discussed with:   Anesthesia Plan Comments:         Anesthesia Quick Evaluation

## 2013-03-19 NOTE — H&P (Signed)
Zachary Vargas is an 79 y.o. male.   Chief Complaint: Ulceration with infection left forefoot HPI: Patient is an 79 year old gentleman with history of type 2 diabetes who is status post a great toe and second toe amputation. Patient has had progressive ulceration and necrosis and osteomyelitis of the third toe. Do to ulceration progression nonhealing with conservative care patient presents at this time for revision surgery.  Past Medical History  Diagnosis Date  . Glaucoma   . History of aortic aneurysm repair   . History of renal stone   . Dyslipidemia     statin intolerant  . Colon polyps   . Melanoma in situ     History of melanoma left back - Dr. Jorja Loa  . Gout     takes Colcrys bid  . Allergy   . BPH (benign prostatic hyperplasia)     [facility]  . History of peptic ulcer disease   . Peptic ulcer disease   . Diverticulosis   . Lumbar spinal stenosis     Most recent MRI 12/ 2011; [facility] - Dr. Ethelene Hal  . Pulmonary disease     Fibrotic parenchymal changes per 5/11 x-ray  . Carotid atherosclerosis     Dopplers 11/09 showing 50-69% stenosis  . Peripheral arterial disease     [place] health care consult 1/08  . Epigastric pain     Hospitalization 7/11 [place]  . Arthritis     [facility]ed by [facility]  . Myeloma     IgA related; Dr. Myna Hidalgo - 2009  . Hypertension     takes Lisinopril daily  . Pneumonia 2006    hospitalized  . Headache(784.0)     rarely  . Eczema   . Constipation     d/t pain meds  . Urinary frequency   . Diabetes mellitus     takes glipizide daily  . Glaucoma   . Anxiety     takes Paxil daily  . Insomnia     takes Lunesta nightly  . Hypothyroidism     takes Synthroid daily  . Anemia     of chronic disease;takes Ferrous Gluconate daily  . Chronic kidney disease     2/08 renal artery ultrasound without significant stenosis; prior consult [place] [facility] consult  . Osteomyelitis     Past Surgical History  Procedure Laterality  Date  . Abdominal aortic aneurysm repair  03/1998  . Abdominal hernia repair  2000  . Renal stone surgery  1999  . Nerve root block      Multiple for lumbar spine  . Colonoscopy  07/22/2004    due repeat 2011; Dr. Dorena Cookey, [facility]  . Eye surgery      bil cataract surgery  . Amputation Left 11/29/2012    Procedure: Left Foot, Great Toe Amputation at MTP Joint;  Surgeon: Nadara Mustard, MD;  Location: [facility] OR;  Service: Orthopedics;  Laterality: Left;  LEFT  Foot, Great Toe Amputation at MTP Joint  . Amputation Left 01/03/2013    Procedure: Left foot 2nd toe amputation at metatarsophalangeal;  Surgeon: Nadara Mustard, MD;  Location: [facility] OR;  Service: Orthopedics;  Laterality: Left;  . Tonsillectomy    . Vasectomy      Family History  Problem Relation Age of Onset  . Diabetes Father   . Glaucoma Father   . Cancer Brother     1 died of lung cancer, 1 of colon, 1 of different cancer  . Glaucoma Daughter   .  Glaucoma Son    Social History:  reports that he has quit smoking. He has never used smokeless tobacco. He reports that he drinks alcohol. He reports that he does not use illicit drugs.  Allergies:  Allergies  Allergen Reactions  . Morphine And Related Shortness Of Breath    hives  . Statins     Hives; pt notes that "all" classes of cholesterol meds do the same   . Allopurinol Hives, Swelling and Rash    No prescriptions prior to admission    No results found for this or any previous visit (from the past 48 hour(s)). No results found.  Review of Systems  All other systems reviewed and are negative.    There were no vitals taken for this visit. Physical Exam  On examination there is ulceration osteomyelitis of the left foot third toe. Assessment/Plan Assessment: Ulceration Zachary Vargas is left foot third toe.  Plan: Will plan for revision to essentially a transmetatarsal amputation. Risks and benefits were discussed including infection neurovascular injury  persistent pain nonhealing of the wound need for additional surgery. Patient states he understands and wished to proceed at this time.  Jeanna Giuffre V 03/19/2013, 6:47 AM

## 2013-03-19 NOTE — Anesthesia Postprocedure Evaluation (Signed)
  Anesthesia Post-op Note  Patient: Zachary Vargas  Procedure(s) Performed: Procedure(s) with comments: AMPUTATION DIGIT (Left) - Left foot Amputation 3, 4, 5th Toes at MTP joint  Patient Location: PACU  Anesthesia Type:Regional  Level of Consciousness: awake, alert , oriented and patient cooperative  Airway and Oxygen Therapy: Patient Spontanous Breathing  Post-op Pain: mild  Post-op Assessment: Post-op Vital signs reviewed, Patient's Cardiovascular Status Stable, Respiratory Function Stable, Patent Airway, No signs of Nausea or vomiting and Pain level controlled  Post-op Vital Signs: stable  Complications: No apparent anesthesia complications

## 2013-03-19 NOTE — Discharge Summary (Signed)
Discharge to home in stable condition. Final diagnosis osteomyelitis left foot third toe with ulceration. Minimize weightbearing left lower extremity. Followup in the office in one week.

## 2013-03-19 NOTE — Anesthesia Procedure Notes (Addendum)
Anesthesia Regional Block:  Ankle block  Pre-Anesthetic Checklist: ,, timeout performed, Correct Patient, Correct Site, Correct Laterality, Correct Procedure, Correct Position, site marked, Risks and benefits discussed,  Surgical consent,  Pre-op evaluation,  At surgeon's request and post-op pain management  Laterality: Left  Prep: chloraprep and alcohol swabs       Needles:  Injection technique: Single-shot      Needle Gauge: 25 and 25 G  Needle insertion depth: 2 cm   Additional Needles: Ankle block Narrative:  Start time: 03/19/2013 9:40 AM End time: 03/19/2013 9:45 AM Injection made incrementally with aspirations every 5 mL.  Performed by: Personally  Anesthesiologist: Maren Beach MD  Additional Notes: Pt accept procedure and risks. 40cc ( 20 cc 0.5% Marcaine and 20cc 2% Lidocaine ) w/o difficulty. GES   Date/Time: 03/19/2013 10:00 AM Performed by: Orvilla Fus A Oxygen Delivery Method: Simple face mask

## 2013-03-19 NOTE — Preoperative (Signed)
Beta Blockers   Reason not to administer Beta Blockers:Not Applicable 

## 2013-03-19 NOTE — Transfer of Care (Signed)
Immediate Anesthesia Transfer of Care Note  Patient: Zachary Vargas  Procedure(s) Performed: Procedure(s) with comments: AMPUTATION DIGIT (Left) - Left foot Amputation 3, 4, 5th Toes at MTP joint  Patient Location: PACU  Anesthesia Type:MAC  Level of Consciousness: awake, alert  and oriented  Airway & Oxygen Therapy: Patient Spontanous Breathing and Patient connected to nasal cannula oxygen  Post-op Assessment: Report given to PACU RN, Post -op Vital signs reviewed and stable and Patient moving all extremities  Post vital signs: Reviewed and stable  Complications: No apparent anesthesia complications

## 2013-03-20 ENCOUNTER — Telehealth: Payer: Self-pay | Admitting: Medical

## 2013-03-20 NOTE — Telephone Encounter (Signed)
Pt called to just update that he had last 3 toes removed on Left foot.

## 2013-03-21 ENCOUNTER — Encounter (HOSPITAL_COMMUNITY): Payer: Self-pay | Admitting: Orthopedic Surgery

## 2013-03-25 ENCOUNTER — Encounter: Payer: Self-pay | Admitting: Medical

## 2013-03-25 ENCOUNTER — Ambulatory Visit (INDEPENDENT_AMBULATORY_CARE_PROVIDER_SITE_OTHER): Payer: Medicare Other | Admitting: Medical

## 2013-03-25 VITALS — BP 110/68 | HR 74 | Temp 98.0°F | Resp 16 | Wt 252.0 lb

## 2013-03-25 DIAGNOSIS — M869 Osteomyelitis, unspecified: Secondary | ICD-10-CM

## 2013-03-25 DIAGNOSIS — G5790 Unspecified mononeuropathy of unspecified lower limb: Secondary | ICD-10-CM

## 2013-03-25 DIAGNOSIS — M1990 Unspecified osteoarthritis, unspecified site: Secondary | ICD-10-CM

## 2013-03-25 DIAGNOSIS — E119 Type 2 diabetes mellitus without complications: Secondary | ICD-10-CM

## 2013-03-25 DIAGNOSIS — M109 Gout, unspecified: Secondary | ICD-10-CM

## 2013-03-25 DIAGNOSIS — I739 Peripheral vascular disease, unspecified: Secondary | ICD-10-CM

## 2013-03-25 DIAGNOSIS — IMO0002 Reserved for concepts with insufficient information to code with codable children: Secondary | ICD-10-CM

## 2013-03-25 DIAGNOSIS — S98139A Complete traumatic amputation of one unspecified lesser toe, initial encounter: Secondary | ICD-10-CM

## 2013-03-25 DIAGNOSIS — Z23 Encounter for immunization: Secondary | ICD-10-CM

## 2013-03-25 NOTE — Progress Notes (Addendum)
Subjective:  Zachary Vargas is a 79 y.o. male who is here today to conduct a mobility examination.  Here for discussion to conduct a mobility examination and get approval for powered wheelchair or Hoveround.  Due to his worsening health and orthopedic issues, he would like to get a powered device to help his ambulation.  He has a history significant for gout, neuropathy, balance problems, bad arthritis, numerous joint pains, recent amputations of his left toes, and for several years now he has had to use cane and walker.  He recently had a ramp installed at his place of residence. He does have a history of some falls. No recent falls though. He notes he can only walk about 20 or 30 feet before he has to stop and rest and sit down. This is due to both joint pains and fatigue.  He would really love to be able to get out of his house and go to the park on a regular basis.  He is basically confined to his home now.  He does feel like the powered wheelchair would allow him to ambulate better both in and out of the house, help him get around the house and get around in stores easier, would greatly improve his quality of life.  Current takes him a long time to do things like bathing and cooking due to his limitations with pain and joint problems.  He is still seeing Dr. Lajoyce [facility] orthopedics for his recent amputations and foot issues.  He continues to have pains regularly, daily in his knees, ankles, hips bilaterally, has neuropathy with burning and tingling in both feet.  Of note, there are no pressure sores.  He does get swelling and edema in the legs.  He does have trouble shifting weight, particularly now in light of the amputations.  Joint pains can be 5-10/10.  Finger pain often is 8-10/10.  No other aggravating or relieving factors.    No other c/o.  The following portions of the patient's history were reviewed and updated as appropriate: allergies, current medications, past family history, past medical history,  past social history, past surgical history and problem list.  Past Medical History  Diagnosis Date  . Glaucoma   . History of aortic aneurysm repair   . History of renal stone   . Dyslipidemia     statin intolerant  . Colon polyps   . Melanoma in situ     History of melanoma left back - Dr. Jorja Loa  . Gout     takes Colcrys bid  . Allergy   . BPH (benign prostatic hyperplasia)     [facility]  . History of peptic ulcer disease   . Peptic ulcer disease   . Diverticulosis   . Lumbar spinal stenosis     Most recent MRI 12/ 2011; [facility] - Dr. Ethelene Hal  . Pulmonary disease     Fibrotic parenchymal changes per 5/11 x-ray  . Carotid atherosclerosis     Dopplers 11/09 showing 50-69% stenosis  . Peripheral arterial disease     [place] health care consult 1/08  . Epigastric pain     Hospitalization 7/11 [place]  . Arthritis     Followed by [facility]  . Myeloma     IgA related; Dr. Myna Hidalgo - 2009  . Hypertension     takes Lisinopril daily  . Pneumonia 2006    hospitalized  . Headache(784.0)     rarely  . Eczema   . Constipation  d/t pain meds  . Urinary frequency   . Diabetes mellitus     takes glipizide daily  . Glaucoma   . Anxiety     takes Paxil daily  . Insomnia     takes Lunesta nightly  . Hypothyroidism     takes Synthroid daily  . Anemia     of chronic disease;takes Ferrous Gluconate daily  . Chronic kidney disease     2/08 renal artery ultrasound without significant stenosis; prior consult [place] [facility] consult  . Osteomyelitis    Past Surgical History  Procedure Laterality Date  . Abdominal aortic aneurysm repair  03/1998  . Abdominal hernia repair  2000  . Renal stone surgery  1999  . Nerve root block      Multiple for lumbar spine  . Colonoscopy  07/22/2004    due repeat 2011; Dr. Dorena Cookey, [facility]  . Eye surgery      bil cataract surgery  . Amputation Left 11/29/2012    Procedure: Left Foot, Great Toe Amputation at MTP  Joint;  Surgeon: Nadara Mustard, MD;  Location: [facility] OR;  Service: Orthopedics;  Laterality: Left;  LEFT  Foot, Great Toe Amputation at MTP Joint  . Amputation Left 01/03/2013    Procedure: Left foot 2nd toe amputation at metatarsophalangeal;  Surgeon: Nadara Mustard, MD;  Location: [facility] OR;  Service: Orthopedics;  Laterality: Left;  . Tonsillectomy    . Vasectomy    . Amputation Left 03/19/2013    Procedure: AMPUTATION DIGIT;  Surgeon: Nadara Mustard, MD;  Location: [facility] OR;  Service: Orthopedics;  Laterality: Left;  Left foot Amputation 3, 4, 5th Toes at MTP joint     ROS Otherwise as in subjective above  Objective: Physical Exam  BP 110/68  Pulse 74  Temp(Src) 98 F (36.7 C) (Oral)  Resp 16  Wt 252 lb (114.306 kg)  BMI 31.5 kg/m2  General appearance: alert, no distress, WD/WN Neck: supple, no lymphadenopathy, no thyromegaly, no masses Heart: RRR, normal S1, S2, no murmurs Lungs: CTA bilaterally, no wheezes, rhonchi, or rales MSK: LUE obvious severe bony arthritic changes of the fingers and hand, decreased range of motion of the worst affected fingers, index and middle fingers with 60% of ROM with flexion at best, but worse when flared up, otherwise unremarkable exam RUE obvious severe bony arthritic changes of the fingers and hand, decreased range of motion of the worst affected fingers, index and middle fingers with 60% of ROM with flexion at best, but worse when flared up, otherwise unremarkable exam LLE bony arthritic changes of the knee, some pain with range of motion, currently has surgical bandage on the left foot from recent toe amputations, decreased hip range of motion, otherwise unremarkable exam RLE bony arthritic changes in knee, mild to moderate pain with ankle and knee range of motion, decreased hip range of motion, otherwise unremarkable exam Pulses: 2+ radial pulses, 1+ pedal pulses, normal cap refill Ext: One plus nonpitting edema bilateral lower extremities Neuro: CN2-12  intact, strength LUE 4-5/5, RUE 4-5/5, LLE 4-5/5, RLE 4-5/5, sensation normal, DTRs normal, gait is slow, with pain, unable to perform heel to toe due to balance issues, unable to really stand and bear weight due to the left foot, he did use a cane to ambulate today   Assessment: Encounter Diagnoses  Name Primary?  . Osteomyelitis of toe Yes  . Osteoarthritis   . Toe amputation status, left   . Gout   . Neuropathy of  foot, unspecified laterality   . Type II or unspecified type diabetes mellitus without mention of complication, not stated as uncontrolled   . Peripheral vascular disease, unspecified   . Need for prophylactic vaccination and inoculation against influenza    The medical conditions that affect the patient's mobility needs include: Osteomyelitis of the left toes, recent toe amputations, osteoarthritis which is severe in the hands and knees, gout, neuropathy of the feet, peripheral vascular disease.  The powered mobility device is necessary for the patient to get to the bathroom to toilet or bathe, to get to the kitchen to prepare meals, cook and eat, to get to the bedroom to groom and dress, to transfer in and out of the home by way of ramp, to get around at a grocery store and take care of his personal needs.  A cane or walker will not meet this patient's mobility needs due to: Poor balance, history of falls, arthritic pain in the hands, hips, knees, ankles.  A manual wheelchair will not need this patient's mobility needs due to: Arthritic pain in the hands, hips, knees, ankles, recent toe amputations  A scooter (POV) will not meet this patient's mobility needs due to: Lack of postural stability, can't operate the tiller of a POV, severe arthritis, recent toe amputations  Zachary Vargas has a physical and mental ability to operate a power mobility device safely in the home.  He is willing and motivated to use a power mobility device in the home.  Counseled on the  influenza virus vaccine.  Vaccine information sheet given.  Influenza vaccine given after consent obtained.   Plan: The powered motility device forms have been completed and sent.  Equipment ordered: Powered motility device/powered wheelchair Length of need: 99/lifetime. Follow up: 2 weeks after utilizing the powered motility device.

## 2013-04-10 ENCOUNTER — Telehealth: Payer: Self-pay | Admitting: Medical

## 2013-04-10 NOTE — Telephone Encounter (Signed)
I received a note from [facility] care management that they have closed his case and provided care.  I don't have any notes on anything they have done.  Please ask them to send me notes/records of their findings, care.

## 2013-04-11 NOTE — Telephone Encounter (Signed)
I called and I left a message for Lucy Antigua at [phone] about this patient and could she please fax over records or notes on his treatment and care to our office. CLS

## 2013-05-07 ENCOUNTER — Encounter (HOSPITAL_COMMUNITY): Payer: Self-pay | Admitting: Emergency Medicine

## 2013-05-07 ENCOUNTER — Encounter: Payer: Self-pay | Admitting: Family Medicine

## 2013-05-07 ENCOUNTER — Emergency Department (HOSPITAL_COMMUNITY)
Admission: EM | Admit: 2013-05-07 | Discharge: 2013-05-07 | Disposition: A | Discharge status: Home / Self Care | Payer: Medicare Other | Attending: Emergency Medicine | Admitting: Emergency Medicine

## 2013-05-07 ENCOUNTER — Ambulatory Visit (INDEPENDENT_AMBULATORY_CARE_PROVIDER_SITE_OTHER): Payer: Medicare Other | Admitting: Family Medicine

## 2013-05-07 VITALS — BP 100/50 | HR 86

## 2013-05-07 DIAGNOSIS — Z860100 Personal history of colon polyps, unspecified: Secondary | ICD-10-CM | POA: Insufficient documentation

## 2013-05-07 DIAGNOSIS — D649 Anemia, unspecified: Secondary | ICD-10-CM | POA: Insufficient documentation

## 2013-05-07 DIAGNOSIS — Z8711 Personal history of peptic ulcer disease: Secondary | ICD-10-CM | POA: Insufficient documentation

## 2013-05-07 DIAGNOSIS — F411 Generalized anxiety disorder: Secondary | ICD-10-CM | POA: Insufficient documentation

## 2013-05-07 DIAGNOSIS — Z79899 Other long term (current) drug therapy: Secondary | ICD-10-CM | POA: Insufficient documentation

## 2013-05-07 DIAGNOSIS — Z8701 Personal history of pneumonia (recurrent): Secondary | ICD-10-CM | POA: Insufficient documentation

## 2013-05-07 DIAGNOSIS — M109 Gout, unspecified: Secondary | ICD-10-CM | POA: Insufficient documentation

## 2013-05-07 DIAGNOSIS — Z87891 Personal history of nicotine dependence: Secondary | ICD-10-CM | POA: Insufficient documentation

## 2013-05-07 DIAGNOSIS — Z87442 Personal history of urinary calculi: Secondary | ICD-10-CM | POA: Insufficient documentation

## 2013-05-07 DIAGNOSIS — H409 Unspecified glaucoma: Secondary | ICD-10-CM | POA: Insufficient documentation

## 2013-05-07 DIAGNOSIS — R531 Weakness: Secondary | ICD-10-CM

## 2013-05-07 DIAGNOSIS — Z85820 Personal history of malignant melanoma of skin: Secondary | ICD-10-CM | POA: Insufficient documentation

## 2013-05-07 DIAGNOSIS — S80211A Abrasion, right knee, initial encounter: Secondary | ICD-10-CM

## 2013-05-07 DIAGNOSIS — G4700 Insomnia, unspecified: Secondary | ICD-10-CM | POA: Insufficient documentation

## 2013-05-07 DIAGNOSIS — IMO0002 Reserved for concepts with insufficient information to code with codable children: Secondary | ICD-10-CM

## 2013-05-07 DIAGNOSIS — F329 Major depressive disorder, single episode, unspecified: Secondary | ICD-10-CM

## 2013-05-07 DIAGNOSIS — M129 Arthropathy, unspecified: Secondary | ICD-10-CM | POA: Insufficient documentation

## 2013-05-07 DIAGNOSIS — K921 Melena: Secondary | ICD-10-CM

## 2013-05-07 DIAGNOSIS — N189 Chronic kidney disease, unspecified: Secondary | ICD-10-CM | POA: Insufficient documentation

## 2013-05-07 DIAGNOSIS — Z7982 Long term (current) use of aspirin: Secondary | ICD-10-CM | POA: Insufficient documentation

## 2013-05-07 DIAGNOSIS — Z872 Personal history of diseases of the skin and subcutaneous tissue: Secondary | ICD-10-CM | POA: Insufficient documentation

## 2013-05-07 DIAGNOSIS — R5381 Other malaise: Secondary | ICD-10-CM | POA: Insufficient documentation

## 2013-05-07 DIAGNOSIS — E039 Hypothyroidism, unspecified: Secondary | ICD-10-CM | POA: Insufficient documentation

## 2013-05-07 DIAGNOSIS — E119 Type 2 diabetes mellitus without complications: Secondary | ICD-10-CM | POA: Insufficient documentation

## 2013-05-07 DIAGNOSIS — E785 Hyperlipidemia, unspecified: Secondary | ICD-10-CM | POA: Insufficient documentation

## 2013-05-07 DIAGNOSIS — K922 Gastrointestinal hemorrhage, unspecified: Secondary | ICD-10-CM

## 2013-05-07 DIAGNOSIS — Z8709 Personal history of other diseases of the respiratory system: Secondary | ICD-10-CM | POA: Insufficient documentation

## 2013-05-07 DIAGNOSIS — Z856 Personal history of leukemia: Secondary | ICD-10-CM | POA: Insufficient documentation

## 2013-05-07 DIAGNOSIS — I129 Hypertensive chronic kidney disease with stage 1 through stage 4 chronic kidney disease, or unspecified chronic kidney disease: Secondary | ICD-10-CM | POA: Insufficient documentation

## 2013-05-07 DIAGNOSIS — N289 Disorder of kidney and ureter, unspecified: Secondary | ICD-10-CM

## 2013-05-07 DIAGNOSIS — Z9889 Other specified postprocedural states: Secondary | ICD-10-CM | POA: Insufficient documentation

## 2013-05-07 LAB — OCCULT BLOOD, POC DEVICE: Fecal Occult Bld: NEGATIVE

## 2013-05-07 LAB — CBC WITH DIFFERENTIAL/PLATELET
Basophils Absolute: 0 10*3/uL (ref 0.0–0.1)
Basophils Relative: 0 % (ref 0–1)
Eosinophils Relative: 14 % — ABNORMAL HIGH (ref 0–5)
HCT: 28 % — ABNORMAL LOW (ref 39.0–52.0)
Lymphocytes Relative: 28 % (ref 12–46)
Lymphs Abs: 1.5 10*3/uL (ref 0.7–4.0)
MCH: 33.9 pg (ref 26.0–34.0)
MCV: 96.9 fL (ref 78.0–100.0)
Monocytes Absolute: 0.7 10*3/uL (ref 0.1–1.0)
Monocytes Relative: 13 % — ABNORMAL HIGH (ref 3–12)
RBC: 2.89 MIL/uL — ABNORMAL LOW (ref 4.22–5.81)
WBC: 5.5 10*3/uL (ref 4.0–10.5)

## 2013-05-07 LAB — URINALYSIS, ROUTINE W REFLEX MICROSCOPIC
Bilirubin Urine: NEGATIVE
Hgb urine dipstick: NEGATIVE
Specific Gravity, Urine: 1.015 (ref 1.005–1.030)
Urobilinogen, UA: 0.2 mg/dL (ref 0.0–1.0)

## 2013-05-07 LAB — LIPASE, BLOOD: Lipase: 23 U/L (ref 11–59)

## 2013-05-07 LAB — TYPE AND SCREEN
ABO/RH(D): O POS
Antibody Screen: NEGATIVE

## 2013-05-07 LAB — PROTIME-INR: INR: 1.03 (ref 0.00–1.49)

## 2013-05-07 LAB — COMPREHENSIVE METABOLIC PANEL
ALT: 16 U/L (ref 0–53)
BUN: 79 mg/dL — ABNORMAL HIGH (ref 6–23)
CO2: 18 mEq/L — ABNORMAL LOW (ref 19–32)
Calcium: 9 mg/dL (ref 8.4–10.5)
Creatinine, Ser: 2.38 mg/dL — ABNORMAL HIGH (ref 0.50–1.35)
GFR calc Af Amer: 27 mL/min — ABNORMAL LOW (ref >90)
GFR calc non Af Amer: 24 mL/min — ABNORMAL LOW (ref >90)
Glucose, Bld: 82 mg/dL (ref 70–99)

## 2013-05-07 LAB — CBC
Hemoglobin: 9.6 g/dL — ABNORMAL LOW (ref 13.0–17.0)
MCH: 32.9 pg (ref 26.0–34.0)
MCHC: 33.1 g/dL (ref 30.0–36.0)
MCV: 99.3 fL (ref 78.0–100.0)
RBC: 2.92 MIL/uL — ABNORMAL LOW (ref 4.22–5.81)
RDW: 14.5 % (ref 11.5–15.5)

## 2013-05-07 LAB — GLUCOSE, CAPILLARY: Glucose-Capillary: 73 mg/dL (ref 70–99)

## 2013-05-07 MED ORDER — SODIUM CHLORIDE 0.9 % IV SOLN
8.0000 mg/h | INTRAVENOUS | Status: DC
  Administered 2013-05-07: 8 mg/h via INTRAVENOUS
  Filled 2013-05-07 (×2): qty 80

## 2013-05-07 MED ORDER — PANTOPRAZOLE SODIUM 40 MG IV SOLR
80.0000 mg | Freq: Once | INTRAVENOUS | Status: AC
  Administered 2013-05-07: 80 mg via INTRAVENOUS
  Filled 2013-05-07: qty 80

## 2013-05-07 MED ORDER — PANTOPRAZOLE SODIUM 20 MG PO TBEC: 20.0000 mg | DELAYED_RELEASE_TABLET | Freq: Every day | ORAL | Status: DC

## 2013-05-07 MED ORDER — PANTOPRAZOLE SODIUM 40 MG IV SOLR: 40.0000 mg | Freq: Two times a day (BID) | INTRAVENOUS | Status: DC

## 2013-05-07 NOTE — ED Provider Notes (Signed)
CSN: [id number]     Arrival date & time 05/07/13  1517 History   First MD Initiated Contact with Patient 05/07/13 1607     Chief Complaint  Patient presents with  . Rectal Bleeding   (Consider location/radiation/quality/duration/timing/severity/associated sxs/prior Treatment) Patient is a 79 y.o. male presenting with hematochezia. The history is provided by the patient.  Rectal Bleeding Quality: dark. Amount:  Moderate Duration:  5 days Timing:  Constant Progression:  Improving Chronicity:  New Context: spontaneously   Context: not rectal pain   Similar prior episodes: yes   Relieved by:  Nothing Worsened by:  Nothing tried Ineffective treatments:  None tried Associated symptoms: no abdominal pain, no fever and no vomiting     Past Medical History  Diagnosis Date  . Glaucoma   . History of aortic aneurysm repair   . History of renal stone   . Dyslipidemia     statin intolerant  . Colon polyps   . Melanoma in situ     History of melanoma left back - Dr. Jorja Loa  . Gout     takes Colcrys bid  . Allergy   . BPH (benign prostatic hyperplasia)     [facility]  . History of peptic ulcer disease   . Peptic ulcer disease   . Diverticulosis   . Lumbar spinal stenosis     Most recent MRI 12/ 2011; [facility] - Dr. Ethelene Hal  . Pulmonary disease     Fibrotic parenchymal changes per 5/11 x-ray  . Carotid atherosclerosis     Dopplers 11/09 showing 50-69% stenosis  . Peripheral arterial disease     [facility] consult 1/08  . Epigastric pain     Hospitalization 7/11 [place]  . Arthritis     Followed by [facility]  . Myeloma     IgA related; Dr. Myna Hidalgo - 2009  . Hypertension     takes Lisinopril daily  . Pneumonia 2006    hospitalized  . Headache(784.0)     rarely  . Eczema   . Constipation     d/t pain meds  . Urinary frequency   . Diabetes mellitus     takes glipizide daily  . Glaucoma   . Anxiety     takes Paxil daily  . Insomnia     takes  Lunesta nightly  . Hypothyroidism     takes Synthroid daily  . Anemia     of chronic disease;takes Ferrous Gluconate daily  . Chronic kidney disease     2/08 renal artery ultrasound without significant stenosis; prior consult [facility]  . Osteomyelitis    Past Surgical History  Procedure Laterality Date  . Abdominal aortic aneurysm repair  03/1998  . Abdominal hernia repair  2000  . Renal stone surgery  1999  . Nerve root block      Multiple for lumbar spine  . Colonoscopy  07/22/2004    due repeat 2011; Dr. Dorena Cookey, [facility]  . Eye surgery      bil cataract surgery  . Amputation Left 11/29/2012    Procedure: Left Foot, Great Toe Amputation at MTP Joint;  Surgeon: Nadara Mustard, MD;  Location: [facility] OR;  Service: Orthopedics;  Laterality: Left;  LEFT  Foot, Great Toe Amputation at MTP Joint  . Amputation Left 01/03/2013    Procedure: Left foot 2nd toe amputation at metatarsophalangeal;  Surgeon: Nadara Mustard, MD;  Location: [facility] OR;  Service: Orthopedics;  Laterality: Left;  . Tonsillectomy    .  Vasectomy    . Amputation Left 03/19/2013    Procedure: AMPUTATION DIGIT;  Surgeon: Nadara Mustard, MD;  Location: [facility] OR;  Service: Orthopedics;  Laterality: Left;  Left foot Amputation 3, 4, 5th Toes at MTP joint   Family History  Problem Relation Age of Onset  . Diabetes Father   . Glaucoma Father   . Cancer Brother     1 died of lung cancer, 1 of colon, 1 of different cancer  . Glaucoma Daughter   . Glaucoma Son    History  Substance Use Topics  . Smoking status: Former Games developer  . Smokeless tobacco: Never Used     Comment: quit 4yrs ago  . Alcohol Use: Yes     Comment: 4 oz drink daily    Review of Systems  Constitutional: Negative for fever.  HENT: Negative for drooling and rhinorrhea.   Eyes: Negative for pain.  Respiratory: Negative for cough and shortness of breath.   Cardiovascular: Negative for chest pain and leg swelling.  Gastrointestinal:  Positive for hematochezia. Negative for nausea, vomiting, abdominal pain and diarrhea.  Genitourinary: Negative for dysuria and hematuria.  Musculoskeletal: Negative for gait problem and neck pain.  Skin: Negative for color change.  Neurological: Negative for numbness and headaches.  Hematological: Negative for adenopathy.  Psychiatric/Behavioral: Negative for behavioral problems.  All other systems reviewed and are negative.    Allergies  Morphine and related; Statins; and Allopurinol  Home Medications   Current Outpatient Rx  Name  Route  Sig  Dispense  Refill  . aspirin EC 81 MG tablet   Oral   Take 81 mg by mouth daily.         . cholecalciferol (VITAMIN D) 1000 UNITS tablet   Oral   Take 1,000 Units by mouth daily.         . colchicine (COLCRYS) 0.6 MG tablet   Oral   Take 0.6 mg by mouth daily.          . dorzolamide-timolol (COSOPT) 22.3-6.8 MG/ML ophthalmic solution   Both Eyes   Place 1 drop into both eyes 2 (two) times daily.           . febuxostat (ULORIC) 40 MG tablet   Oral   Take 40 mg by mouth daily.         . ferrous gluconate (FERGON) 325 MG tablet   Oral   Take 325 mg by mouth daily with breakfast.           . furosemide (LASIX) 40 MG tablet   Oral   Take 1 tablet (40 mg total) by mouth daily.   90 tablet   3   . glipiZIDE (GLUCOTROL) 10 MG tablet   Oral   Take 5 mg by mouth daily.         . hydrOXYzine (ATARAX/VISTARIL) 25 MG tablet   Oral   Take 75 mg by mouth at bedtime.         Marland Kitchen latanoprost (XALATAN) 0.005 % ophthalmic solution   Right Eye   Place 1 drop into the right eye at bedtime.           Marland Kitchen levothyroxine (SYNTHROID, LEVOTHROID) 50 MCG tablet   Oral   Take 50 mcg by mouth daily before breakfast.          . lisinopril (PRINIVIL,ZESTRIL) 10 MG tablet   Oral   Take 1 tablet (10 mg total) by mouth daily.   90 tablet   1   .  Multiple Vitamins-Minerals (MULTIVITAMIN WITH MINERALS) tablet   Oral   Take 1  tablet by mouth daily.         . Omega-3 Fatty Acids (FISH OIL) 1000 MG CAPS   Oral   Take 1,000 mg by mouth daily.         Marland Kitchen PARoxetine (PAXIL) 40 MG tablet   Oral   Take 40 mg by mouth daily.         . Probiotic Product (PROBIOTIC PO)   Oral   Take 1 tablet by mouth daily.         Marland Kitchen triamcinolone cream (KENALOG) 0.1 %   Topical   Apply 1 application topically 2 (two) times daily.         Marland Kitchen zolpidem (AMBIEN) 10 MG tablet   Oral   Take 10 mg by mouth at bedtime.          BP 129/42  Pulse 84  Temp(Src) 98.3 F (36.8 C) (Oral)  Resp 17  Wt 250 lb (113.399 kg)  SpO2 100% Physical Exam  Nursing note and vitals reviewed. Constitutional: He is oriented to person, place, and time. He appears well-developed and well-nourished.  HENT:  Head: Normocephalic and atraumatic.  Right Ear: External ear normal.  Left Ear: External ear normal.  Nose: Nose normal.  Mouth/Throat: Oropharynx is clear and moist. No oropharyngeal exudate.  Eyes: Conjunctivae and EOM are normal. Pupils are equal, round, and reactive to light.  Neck: Normal range of motion. Neck supple.  Cardiovascular: Normal rate, regular rhythm, normal heart sounds and intact distal pulses.  Exam reveals no gallop and no friction rub.   No murmur heard. Pulmonary/Chest: Effort normal and breath sounds normal. No respiratory distress. He has no wheezes.  Abdominal: Soft. Bowel sounds are normal. He exhibits no distension. There is no tenderness. There is no rebound and no guarding.  Genitourinary:  Mild superficial erythema around the anus and sacral area. Normal appearing external rectum otherwise. Dark stool on my exam without gross blood.  Musculoskeletal: Normal range of motion. He exhibits no edema and no tenderness.  Mild to moderate old bruising over right knee and right shin. Abrasion to right knee.   Neurological: He is alert and oriented to person, place, and time.  Skin: Skin is warm and dry.   Psychiatric: He has a normal mood and affect. His behavior is normal.    ED Course  Procedures (including critical care time) Labs Review Labs Reviewed  CBC WITH DIFFERENTIAL - Abnormal; Notable for the following:    RBC 2.89 (*)    Hemoglobin 9.8 (*)    HCT 28.0 (*)    Monocytes Relative 13 (*)    Eosinophils Relative 14 (*)    Eosinophils Absolute 0.8 (*)    All other components within normal limits  COMPREHENSIVE METABOLIC PANEL - Abnormal; Notable for the following:    CO2 18 (*)    BUN 79 (*)    Creatinine, Ser 2.38 (*)    Albumin 3.4 (*)    Total Bilirubin 0.1 (*)    GFR calc non Af [name] 24 (*)    GFR calc Af [name] 27 (*)    All other components within normal limits  CBC - Abnormal; Notable for the following:    RBC 2.92 (*)    Hemoglobin 9.6 (*)    HCT 29.0 (*)    All other components within normal limits  PROTIME-INR  APTT  LIPASE, BLOOD  LACTIC ACID, PLASMA  URINALYSIS, ROUTINE W REFLEX MICROSCOPIC  GLUCOSE, CAPILLARY  GLUCOSE, CAPILLARY  OCCULT BLOOD, POC DEVICE  TYPE AND SCREEN  ABO/RH   Imaging Review No results found.   Date: 05/07/2013  Rate: 70  Rhythm: normal sinus rhythm  QRS Axis: normal  Intervals: normal  ST/T Wave abnormalities: nonspecific T wave changes  Conduction Disutrbances:none  Narrative Interpretation: isolated t wave inversion in V2  Old EKG Reviewed: changes noted   MDM   1. Generalized weakness   2. Renal insufficiency    4:32 PM 79 y.o. male with history of remote GI bleed approximately 8 years ago who presents with dark stools for the last 5 days. The patient believes that his stools have been improving in color. He reports a positive Hemoccult by his primary care Dr. who also found that his hemoglobin today had dropped from 10.6-8.7 over the last month. The patient was sent here for evaluation. He notes that he felt much weaker today but denies any chest pain or shortness of breath. He is afebrile and vital signs are  unremarkable here. He has mildly dark stools on my exam without gross blood. Will get lab work.  Hemeoccult neg. Hgb here is 9.8. Pt has renal insuff, but is known to have CKD. He continues to appear well. Will get repeat cbc.   Repeat Hgb 9.6. Pt remains stable and would like to go home. I think this is reasonable given stable Hgb, hemeoccult neg stool, and well appearing pt. I suspect his dark stool is assoc w/ Fe supplementation and the initial Hgb drawn by the pcp may have been a spurious value.   10:17 PM: I gave the pt strong return precautions and recommended her f/u w/ his pcp next Monday.  I have discussed the diagnosis/risks/treatment options with the patient and believe the pt to be eligible for discharge home to follow-up with pcp next monday. We also discussed returning to the ED immediately if new or worsening sx occur. We discussed the sx which are most concerning (e.g., weakness, sob, cp, bloody stools, dizziness) that necessitate immediate return. Any new prescriptions provided to the patient are listed below.  Discharge Medication List as of 05/07/2013 10:19 PM    START taking these medications   Details  pantoprazole (PROTONIX) 20 MG tablet Take 1 tablet (20 mg total) by mouth daily., Starting 05/07/2013, Until Discontinued, Print          Junius Argyle, MD 05/08/13 1451

## 2013-05-07 NOTE — ED Notes (Signed)
Pt c/o blacky tarry stool x 5 days; pt sent from PCP for eval with some generalized weakness and dizziness; per PCP pt with decrease in hgb

## 2013-05-07 NOTE — ED Notes (Signed)
Vital signs stable. 

## 2013-05-07 NOTE — Progress Notes (Signed)
   Subjective:    Patient ID: Zachary Vargas, male    DOB: 1928-06-02, 79 y.o.   MRN: [id number]  HPI He complains of weakness for 3 weeks and black stools for 5 days.No abdominal pain with one episode of vomiting.Marland Kitchen also he recently fell down injuring his right knee and does have a bandage on it. He also feels quite despondent and is tired of fighting living. He is ready to give up.   Review of Systems     Objective:   Physical Exam Alert and weak appearing with flat affect and slightly tearful. Abdominal exam shows active bowel sounds without masses or tenderness. Rectal exam was guaiac-positive. Postural signs were equivocal with pulse actually going up 10 points. Exam of the right knee shows a 4-5 x 5 cm area of abrasion but is not hot tender. Indistinct hemoglobin was 8.7.      Assessment & Plan:  Blood in stool - Plan: Hemoglobin  Acute GI bleeding - Plan: Hemoccult - 1 Card (office)  Knee abrasion, right, initial encounter  Depression  I think he needs further evaluation since it appears that his hemoglobin has dropped 2 points with black stool that is guaiac positive and questionable postural signs. With all of his medical issues I think it is prudent for him to go to the ER for more thorough evaluation of his GI bleeding. I do not fill comfortable handling this as an outpatient especially before a Holiday. He is to return here after this evaluation for further discussion of his depression.

## 2013-05-15 ENCOUNTER — Telehealth: Payer: Self-pay | Admitting: Medical

## 2013-05-15 NOTE — Telephone Encounter (Signed)
Needs f/u with Dr. Susann Givens.  He came in over a week ago for weakness, was seen in the ED ,and is due back at this time.

## 2013-05-16 ENCOUNTER — Ambulatory Visit: Payer: Medicare Other | Admitting: Family Medicine

## 2013-05-16 NOTE — Telephone Encounter (Signed)
Patient is aware and he has an appointment with Dr. Susann Givens. CLS

## 2013-05-18 ENCOUNTER — Other Ambulatory Visit: Payer: Self-pay | Admitting: Medical

## 2013-05-20 ENCOUNTER — Ambulatory Visit: Payer: Medicare Other | Admitting: Family Medicine

## 2013-05-22 ENCOUNTER — Ambulatory Visit (INDEPENDENT_AMBULATORY_CARE_PROVIDER_SITE_OTHER): Payer: Medicare Other | Admitting: Family Medicine

## 2013-05-22 ENCOUNTER — Encounter (INDEPENDENT_AMBULATORY_CARE_PROVIDER_SITE_OTHER): Payer: Self-pay

## 2013-05-22 ENCOUNTER — Encounter: Payer: Self-pay | Admitting: Family Medicine

## 2013-05-22 VITALS — BP 130/70 | HR 70 | Wt 254.0 lb

## 2013-05-22 DIAGNOSIS — M1990 Unspecified osteoarthritis, unspecified site: Secondary | ICD-10-CM

## 2013-05-22 DIAGNOSIS — R52 Pain, unspecified: Secondary | ICD-10-CM

## 2013-05-22 DIAGNOSIS — M129 Arthropathy, unspecified: Secondary | ICD-10-CM

## 2013-05-22 DIAGNOSIS — F329 Major depressive disorder, single episode, unspecified: Secondary | ICD-10-CM

## 2013-05-22 MED ORDER — NORTRIPTYLINE HCL 10 MG PO CAPS: 10.0000 mg | ORAL_CAPSULE | Freq: Every day | ORAL | Status: DC

## 2013-05-22 NOTE — Progress Notes (Signed)
   Subjective:    Patient ID: Zachary Vargas, male    DOB: [birth year], 79 y.o.   MRN: [id number]  HPI He is here for recheck. His emergency room record was reviewed. He essentially had no major concerns. Since then he has seen Dr. Dierdre Forth and had injections in both knees. He did get some initial relief but is now having some knee pain again. This has interfered with his ability to function. His wife is apparently coming home and will be helping with his care. He discussed possibly getting a motorized wheelchair. He is also seeing Dr. Lajoyce Corners who apparently gave him Neurontin to help with his foot neuropathy. It causes sedation and he stopped the medication. He states psychologically he is doing better and is not interested in making any other changes.   Review of Systems     Objective:   Physical Exam Alert and in no distress. His affect seems appropriate.       Assessment & Plan:  Generalized pain - Plan: nortriptyline (PAMELOR) 10 MG capsule  Depression  Arthritis  I will try him on nortriptyline to see if we can help with the neuropathy and possibly help with sleeping. Discussed motorized wheelchair and explained that he has to have more difficulties especially with opportunity weakness to truly qualify. Also explained that I want him to be as active as possible and not will I on the wheelchair. Recheck here in one month.

## 2013-05-23 ENCOUNTER — Telehealth: Payer: Self-pay | Admitting: Family Medicine

## 2013-05-23 NOTE — Telephone Encounter (Signed)
Pt called and stated he has questions concerning new medication. He states that he thought medication was for tingling in his feet and bottle says for depression. Please call pt at [phone].

## 2013-05-29 ENCOUNTER — Telehealth: Payer: Self-pay | Admitting: Family Medicine

## 2013-05-29 NOTE — Telephone Encounter (Signed)
Patient needs refill on sleeping pills         [facility]

## 2013-05-29 NOTE — Telephone Encounter (Signed)
See how he is doing. The medicine I gave him for his feet can also help with sleep so he might not need the sleeping pill

## 2013-05-29 NOTE — Telephone Encounter (Signed)
PT IS DOING GOOD SAID MED FOR HIS FEET NEEDS TO BE STRONGER AND HE HAS TO HAVE HIS SLEEPING PILLS OR HE CANT SLEEP

## 2013-05-29 NOTE — Telephone Encounter (Signed)
Have him double the dose to see if that'll help with his feet and sleeping. I want to try and use one medicine for both rather than one for each issue.

## 2013-05-29 NOTE — Telephone Encounter (Signed)
The meds are helping his feet I will have him double the strength and also see how that works for helping him sleep.

## 2013-05-29 NOTE — Telephone Encounter (Signed)
PT AWARE VERBALIZED UNDERSTANDING

## 2013-05-30 ENCOUNTER — Other Ambulatory Visit: Payer: Self-pay | Admitting: Family Medicine

## 2013-05-30 NOTE — Telephone Encounter (Signed)
Patient called needs refills on Ambien, colcrys, hydroxyzine, levothyroxine   [facility]   Patient wants to be called when Rx's are sent in, he states he called 3-4 weeks and he never heard anything

## 2013-05-30 NOTE — Telephone Encounter (Signed)
LEFT MESSAGE FOR PT TO PLEASE CALL AND MAKE APPOINTMENT AND TO BRING ALL MED IN BOTTLE'S SO WE CAN GO OVER THEM

## 2013-05-30 NOTE — Telephone Encounter (Signed)
Renew the meds and reinforced what we talked about with the Ambien

## 2013-06-03 ENCOUNTER — Encounter: Payer: Self-pay | Admitting: Family Medicine

## 2013-06-03 ENCOUNTER — Ambulatory Visit (INDEPENDENT_AMBULATORY_CARE_PROVIDER_SITE_OTHER): Payer: Medicare Other | Admitting: Family Medicine

## 2013-06-03 ENCOUNTER — Institutional Professional Consult (permissible substitution): Payer: Medicare Other | Admitting: Family Medicine

## 2013-06-03 DIAGNOSIS — F329 Major depressive disorder, single episode, unspecified: Secondary | ICD-10-CM

## 2013-06-03 DIAGNOSIS — M1A00X1 Idiopathic chronic gout, unspecified site, with tophus (tophi): Secondary | ICD-10-CM

## 2013-06-03 DIAGNOSIS — E1149 Type 2 diabetes mellitus with other diabetic neurological complication: Secondary | ICD-10-CM

## 2013-06-03 DIAGNOSIS — M1990 Unspecified osteoarthritis, unspecified site: Secondary | ICD-10-CM

## 2013-06-03 DIAGNOSIS — E039 Hypothyroidism, unspecified: Secondary | ICD-10-CM

## 2013-06-03 DIAGNOSIS — M129 Arthropathy, unspecified: Secondary | ICD-10-CM

## 2013-06-03 DIAGNOSIS — E1140 Type 2 diabetes mellitus with diabetic neuropathy, unspecified: Secondary | ICD-10-CM

## 2013-06-03 DIAGNOSIS — E1142 Type 2 diabetes mellitus with diabetic polyneuropathy: Secondary | ICD-10-CM

## 2013-06-03 MED ORDER — NORTRIPTYLINE HCL 25 MG PO CAPS: 25.0000 mg | ORAL_CAPSULE | Freq: Every day | ORAL | Status: DC

## 2013-06-03 MED ORDER — ZOLPIDEM TARTRATE 5 MG PO TABS: 5.0000 mg | ORAL_TABLET | Freq: Every evening | ORAL | Status: DC | PRN

## 2013-06-03 NOTE — Patient Instructions (Addendum)
Taper the Paxil to 20 mg for 2 weeks and then stop. Use of the nortriptyline 10 mg taking 2 of them at night then switch over to the 25 mg Try that medicine first and then if that doesn't work for sleeping use her Ambien

## 2013-06-03 NOTE — Progress Notes (Signed)
   Subjective:    Patient ID: Zachary Vargas, male    DOB: 1928/01/11, 79 y.o.   MRN: [id number]  HPI He is here to go over all of his medications. He is being treated by Dr. Dierdre Forth for gouty arthritis and continues on Colcrys and Uloric. He does have chronic difficulty with pruritus and is taking hydroxyzine. He also is using a topical medication however he did not bring that with him. He does not think Paxil is doing him any good. It initially did help but at this point he feels no benefit. He does state that the nortriptyline at 20 mg has decreased the tingling sensation in his feet but did not help much with his sleep pattern. He continues on Synthroid.   Review of Systems     Objective:   Physical Exam Alert and in no distress otherwise not examined       Assessment & Plan:  Hypothyroidism  Depression  Arthritis  Chronic gouty arthropathy with tophus (tophi)  Diabetic neuropathy, type II diabetes mellitus  he will continue on his thyroid medication. I will taper him off Paxil and monitor that situation. I will probably place him on a different antidepressant. Cussed use of Ambien and we'll try to get him off of this. I will increase his nortriptyline to 25 mg and see if this will help with the neuropathy as well as with sleep. Recheck here in several months.

## 2013-06-10 ENCOUNTER — Other Ambulatory Visit: Payer: Self-pay | Admitting: Family Medicine

## 2013-06-10 MED ORDER — NORTRIPTYLINE HCL 10 MG PO CAPS: 10.0000 mg | ORAL_CAPSULE | Freq: Two times a day (BID) | ORAL | Status: DC

## 2013-06-10 NOTE — Telephone Encounter (Signed)
done

## 2013-06-14 ENCOUNTER — Other Ambulatory Visit: Payer: Self-pay | Admitting: Family Medicine

## 2013-06-15 ENCOUNTER — Other Ambulatory Visit: Payer: Self-pay | Admitting: Medical

## 2013-06-16 ENCOUNTER — Telehealth: Payer: Self-pay | Admitting: Family Medicine

## 2013-06-16 NOTE — Telephone Encounter (Signed)
Is this okay to refill? 

## 2013-06-16 NOTE — Telephone Encounter (Signed)
Ambien denied. I am working to try and get him off this medication

## 2013-06-18 ENCOUNTER — Telehealth: Payer: Self-pay | Admitting: Family Medicine

## 2013-06-18 NOTE — Telephone Encounter (Signed)
Pt called to inquire about ambien and why it hasnt been refilled. I read the last notes from his office visit and pt became angry. He states that his understanding from last visit was the [place] was going to reduced to a lessor dose not discontinued. Pt says that he is not sleeping at all and is going thru a living hell. Please address with pt to assist with understanding.

## 2013-06-19 ENCOUNTER — Telehealth: Payer: Self-pay | Admitting: Family Medicine

## 2013-06-19 NOTE — Telephone Encounter (Signed)
Apparently the 25 mg nortriptyline might possibly be helping with the tingling however last night it did not. I will have him increase this to 50 mg. He will come in for a visit on Monday. I told him that I would like to avoid sleep medicine possible.

## 2013-06-23 ENCOUNTER — Ambulatory Visit: Payer: Medicare Other | Admitting: Family Medicine

## 2013-06-23 ENCOUNTER — Ambulatory Visit (INDEPENDENT_AMBULATORY_CARE_PROVIDER_SITE_OTHER): Payer: Medicare Other | Admitting: Family Medicine

## 2013-06-23 ENCOUNTER — Encounter: Payer: Self-pay | Admitting: Family Medicine

## 2013-06-23 VITALS — BP 134/76 | HR 79

## 2013-06-23 DIAGNOSIS — E1159 Type 2 diabetes mellitus with other circulatory complications: Secondary | ICD-10-CM

## 2013-06-23 DIAGNOSIS — I10 Essential (primary) hypertension: Secondary | ICD-10-CM

## 2013-06-23 DIAGNOSIS — R5381 Other malaise: Secondary | ICD-10-CM

## 2013-06-23 DIAGNOSIS — E1169 Type 2 diabetes mellitus with other specified complication: Secondary | ICD-10-CM

## 2013-06-23 DIAGNOSIS — R5383 Other fatigue: Secondary | ICD-10-CM

## 2013-06-23 DIAGNOSIS — E039 Hypothyroidism, unspecified: Secondary | ICD-10-CM

## 2013-06-23 DIAGNOSIS — F32A Depression, unspecified: Secondary | ICD-10-CM

## 2013-06-23 DIAGNOSIS — L509 Urticaria, unspecified: Secondary | ICD-10-CM

## 2013-06-23 DIAGNOSIS — M129 Arthropathy, unspecified: Secondary | ICD-10-CM

## 2013-06-23 DIAGNOSIS — G609 Hereditary and idiopathic neuropathy, unspecified: Secondary | ICD-10-CM

## 2013-06-23 DIAGNOSIS — D649 Anemia, unspecified: Secondary | ICD-10-CM

## 2013-06-23 DIAGNOSIS — F3289 Other specified depressive episodes: Secondary | ICD-10-CM

## 2013-06-23 DIAGNOSIS — E119 Type 2 diabetes mellitus without complications: Secondary | ICD-10-CM

## 2013-06-23 DIAGNOSIS — R531 Weakness: Secondary | ICD-10-CM

## 2013-06-23 DIAGNOSIS — F329 Major depressive disorder, single episode, unspecified: Secondary | ICD-10-CM

## 2013-06-23 DIAGNOSIS — M1990 Unspecified osteoarthritis, unspecified site: Secondary | ICD-10-CM

## 2013-06-23 DIAGNOSIS — Z79899 Other long term (current) drug therapy: Secondary | ICD-10-CM

## 2013-06-23 DIAGNOSIS — N189 Chronic kidney disease, unspecified: Secondary | ICD-10-CM

## 2013-06-23 LAB — CBC WITH DIFFERENTIAL/PLATELET
BASOS ABS: 0 10*3/uL (ref 0.0–0.1)
Basophils Relative: 0 % (ref 0–1)
EOS PCT: 9 % — AB (ref 0–5)
Eosinophils Absolute: 0.5 10*3/uL (ref 0.0–0.7)
HCT: 31 % — ABNORMAL LOW (ref 39.0–52.0)
Hemoglobin: 10.2 g/dL — ABNORMAL LOW (ref 13.0–17.0)
LYMPHS ABS: 1.8 10*3/uL (ref 0.7–4.0)
LYMPHS PCT: 32 % (ref 12–46)
MCH: 32.1 pg (ref 26.0–34.0)
MCHC: 32.9 g/dL (ref 30.0–36.0)
MCV: 97.5 fL (ref 78.0–100.0)
Monocytes Absolute: 0.6 10*3/uL (ref 0.1–1.0)
Monocytes Relative: 11 % (ref 3–12)
NEUTROS ABS: 2.8 10*3/uL (ref 1.7–7.7)
Neutrophils Relative %: 48 % (ref 43–77)
PLATELETS: 174 10*3/uL (ref 150–400)
RBC: 3.18 MIL/uL — ABNORMAL LOW (ref 4.22–5.81)
RDW: 14.3 % (ref 11.5–15.5)
WBC: 5.8 10*3/uL (ref 4.0–10.5)

## 2013-06-23 LAB — COMPREHENSIVE METABOLIC PANEL
ALBUMIN: 3.7 g/dL (ref 3.5–5.2)
ALT: 21 U/L (ref 0–53)
AST: 17 U/L (ref 0–37)
Alkaline Phosphatase: 79 U/L (ref 39–117)
BUN: 60 mg/dL — ABNORMAL HIGH (ref 6–23)
CO2: 21 meq/L (ref 19–32)
Calcium: 9.3 mg/dL (ref 8.4–10.5)
Chloride: 107 mEq/L (ref 96–112)
Creat: 1.86 mg/dL — ABNORMAL HIGH (ref 0.50–1.35)
Glucose, Bld: 64 mg/dL — ABNORMAL LOW (ref 70–99)
POTASSIUM: 4.5 meq/L (ref 3.5–5.3)
SODIUM: 139 meq/L (ref 135–145)
TOTAL PROTEIN: 6.3 g/dL (ref 6.0–8.3)
Total Bilirubin: 0.4 mg/dL (ref 0.3–1.2)

## 2013-06-23 LAB — LIPID PANEL
CHOL/HDL RATIO: 3.5 ratio
CHOLESTEROL: 241 mg/dL — AB (ref 0–200)
HDL: 69 mg/dL (ref >39)
LDL Cholesterol: 106 mg/dL — ABNORMAL HIGH (ref 0–99)
Triglycerides: 332 mg/dL — ABNORMAL HIGH (ref <150)
VLDL: 66 mg/dL — ABNORMAL HIGH (ref 0–40)

## 2013-06-23 MED ORDER — HYDROXYZINE HCL 25 MG PO TABS: ORAL_TABLET | ORAL | Status: DC

## 2013-06-23 MED ORDER — LISINOPRIL 10 MG PO TABS: 10.0000 mg | ORAL_TABLET | Freq: Every day | ORAL | Status: DC

## 2013-06-23 MED ORDER — NORTRIPTYLINE HCL 50 MG PO CAPS: 50.0000 mg | ORAL_CAPSULE | Freq: Every day | ORAL | Status: DC

## 2013-06-23 MED ORDER — LEVOTHYROXINE SODIUM 50 MCG PO TABS: ORAL_TABLET | ORAL | Status: DC

## 2013-06-23 NOTE — Patient Instructions (Addendum)
Start with 20 mg of Paxil for the next 2 weeks and then go to 40. Continue on the nortriptyline Use lemon drops

## 2013-06-23 NOTE — Progress Notes (Signed)
   Subjective:    Patient ID: Zachary Vargas, male    DOB: 12-Apr-1928, 80 y.o.   MRN: [id number]  HPI Is here for medication recheck. He increased his nortriptyline to 50 mg which has helped with neuropathy and help him get to sleep but unfortunately he is waking up at one or 2 in the morning and having a great deal of anxiety. He has stopped his Paxil and thinks it might be time to start this again. He continues to complain of generalized weakness as well as arthritis. He does have total knee replacement scheduled in the near future. He continues on hydroxyzine to treat his chronic urticaria. He was taking 3 at bedtime and there's not a good explanation as to why. He continues on his blood pressure and thyroid medications. Review his record does indicate slightly low hemoglobin as well as renal insufficiency.   Review of Systems     Objective:   Physical Exam Alert and in no distress otherwise not examined       Assessment & Plan:  Chronic kidney disease, unspecified - Plan: CBC with Differential, Comprehensive metabolic panel  Generalized weakness  Depression  Anemia, unspecified - Plan: CBC with Differential  Unspecified hereditary and idiopathic peripheral neuropathy - Plan: nortriptyline (PAMELOR) 50 MG capsule  Hypertension associated with diabetes - Plan: lisinopril (PRINIVIL,ZESTRIL) 10 MG tablet  Hypothyroidism - Plan: levothyroxine (SYNTHROID, LEVOTHROID) 50 MCG tablet  Encounter for long-term (current) use of other medications - Plan: CBC with Differential, Comprehensive metabolic panel, Lipid panel  Urticaria - Plan: hydrOXYzine (ATARAX/VISTARIL) 25 MG tablet  Type II or unspecified type diabetes mellitus without mention of complication, not stated as uncontrolled - Plan: CBC with Differential, Comprehensive metabolic panel, Lipid panel, POCT glycosylated hemoglobin (Hb A1C)  Arthritis  his Atarax was changed to 3 times a day dosing. Routine blood screening.  Renewal of nortriptyline at 50 mg. Also start him back on Paxil at 20 mg for 2 weeks increasing to 40. He will then return in one month for followup visit. 30 minutes spent discussing all these issues with him.

## 2013-06-24 NOTE — Progress Notes (Signed)
Pt aware of results will mail copy of labs per pt request

## 2013-06-26 ENCOUNTER — Telehealth: Payer: Self-pay | Admitting: Internal Medicine

## 2013-06-26 NOTE — Telephone Encounter (Signed)
He complains of constipation and continued worsening fatigue. Recommend he try an enema to help with his constipation. Reviewed his blood work. He will give it several more days before reevaluating the fatigue

## 2013-06-26 NOTE — Telephone Encounter (Signed)
Pt would like you to call him. Pt states he is really weak.

## 2013-07-03 ENCOUNTER — Other Ambulatory Visit: Payer: Self-pay | Admitting: Family Medicine

## 2013-07-04 ENCOUNTER — Telehealth: Payer: Self-pay | Admitting: Family Medicine

## 2013-07-04 NOTE — Telephone Encounter (Signed)
Have him discuss this when he comes in for the TSH

## 2013-07-04 NOTE — Telephone Encounter (Signed)
Is this okay to refill? 

## 2013-07-04 NOTE — Telephone Encounter (Signed)
Tell him that we checked it in Feb.Have him come in again and get a TSH

## 2013-07-04 NOTE — Telephone Encounter (Signed)
I left the patient a voicemail. CLS

## 2013-07-05 IMAGING — CR DG FOREARM 2V*L*
3 series · 3 of 3 positions shown · non-contrast
Comparison: None.

CLINICAL DATA: Recent fall with pain in the arm

LEFT FOREARM - 2 VIEW

[x forearm ap left]
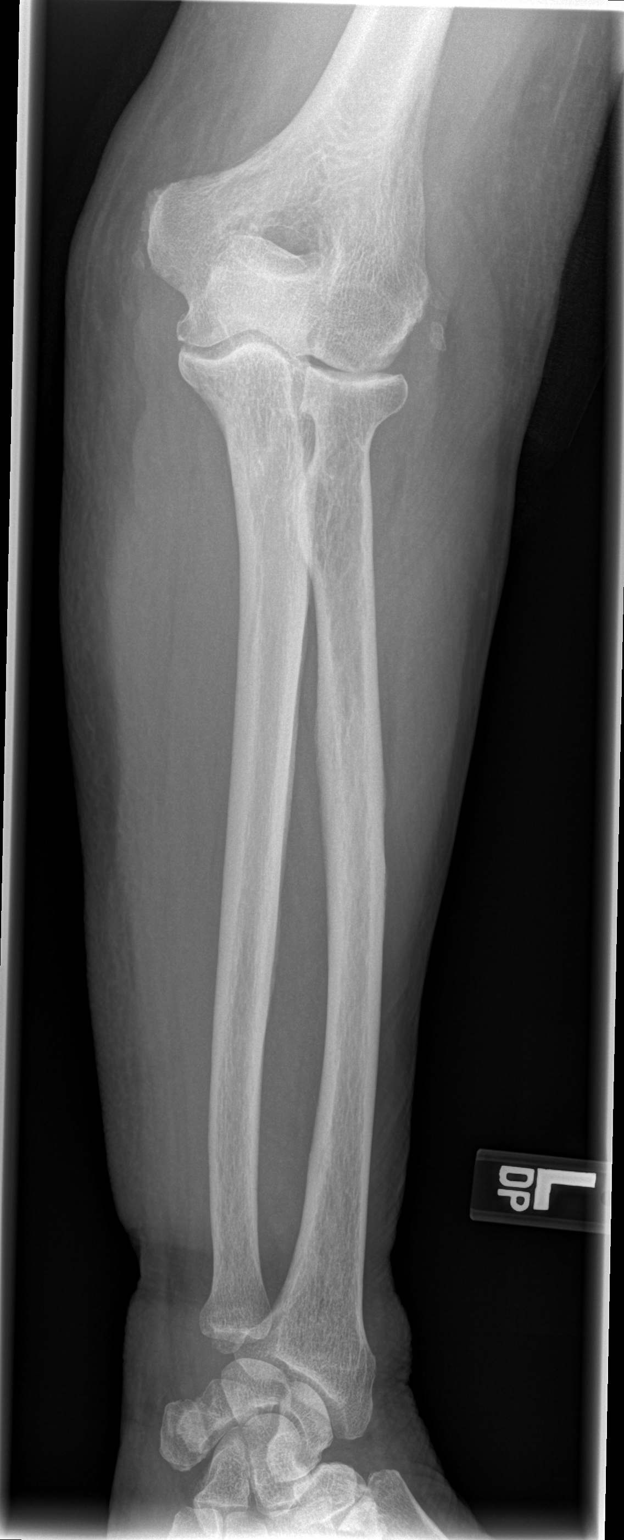

[x forearm lat left (1 of 2)]
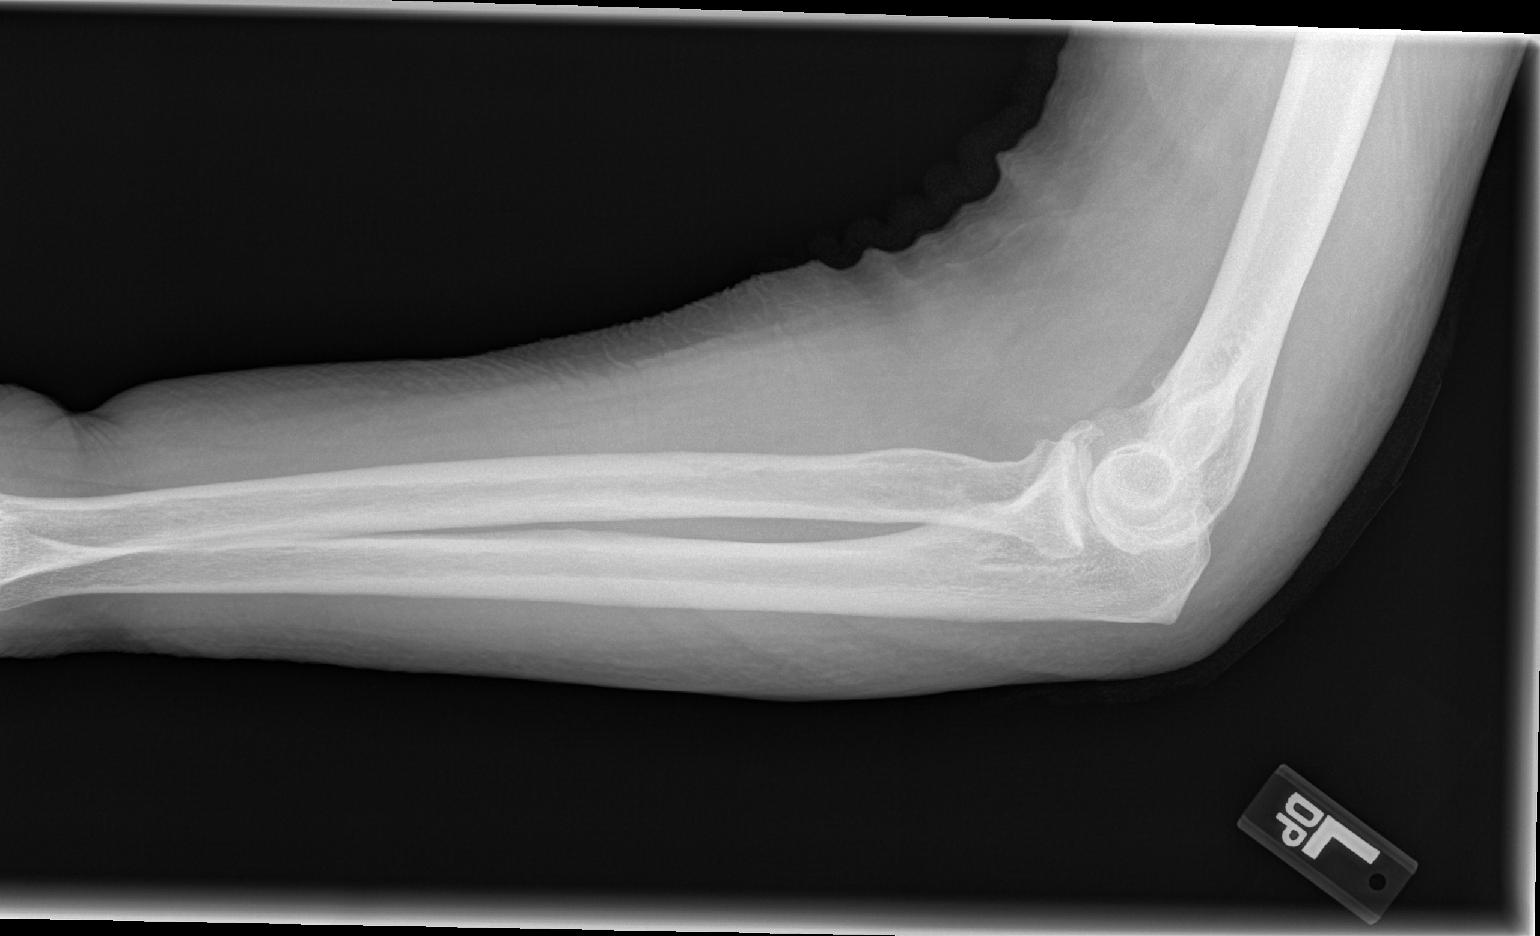

[x forearm lat left (2 of 2)]
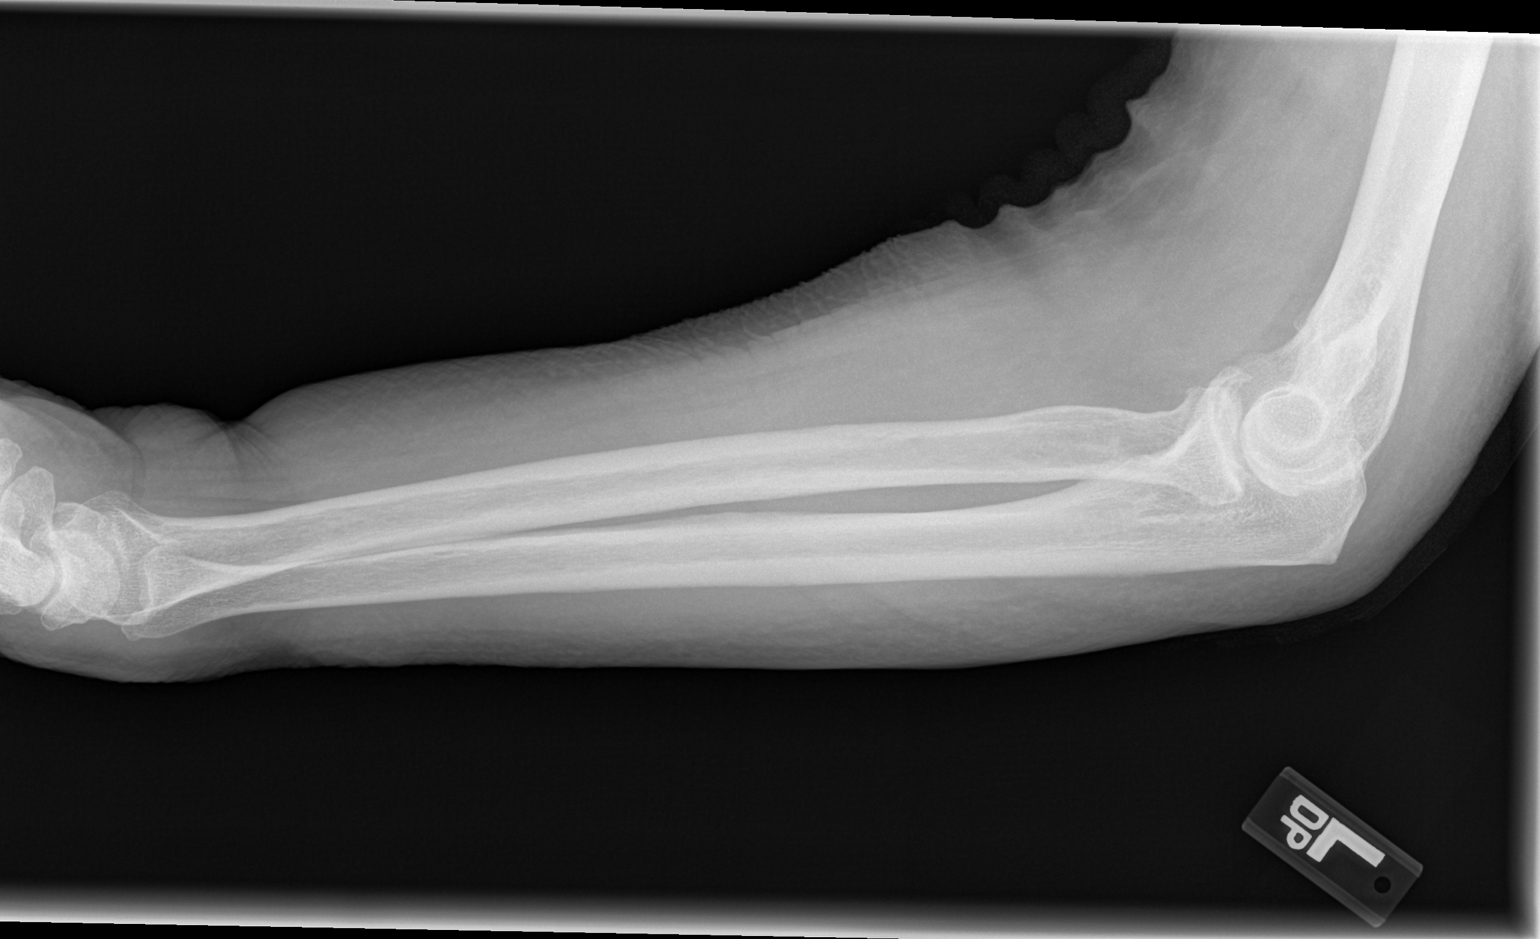

[3 of 3 positions shown; findings below may reference images not displayed]

FINDINGS: The left radius and ulna are intact and normally aligned.
No elbow joint effusion is seen.
IMPRESSION: Negative left forearm.

## 2013-07-05 IMAGING — CR DG RIBS W/ CHEST 3+V*R*
3 series · 3 of 3 positions shown · non-contrast
Comparison: Chest x-ray of 10/20/2009

CLINICAL DATA: Fell 1 week ago with pain in the right anterior ribs

RIGHT RIBS AND CHEST - 3+ VIEW

[w chest pa]
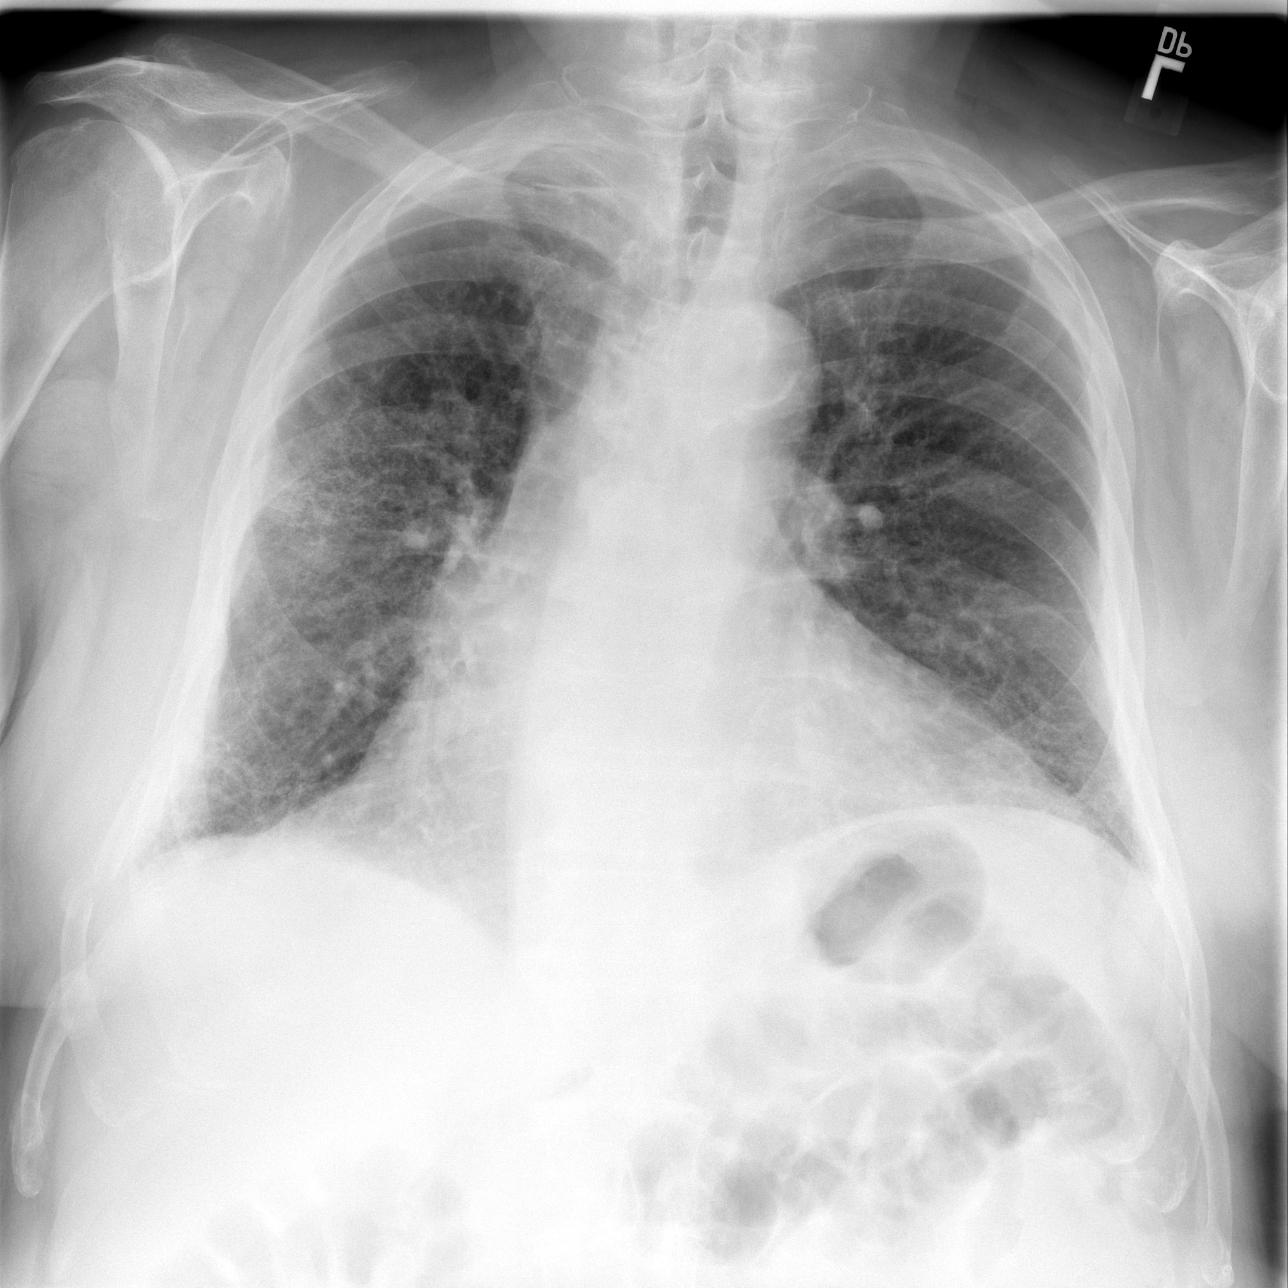

[w ribs ap/pa lower right *]
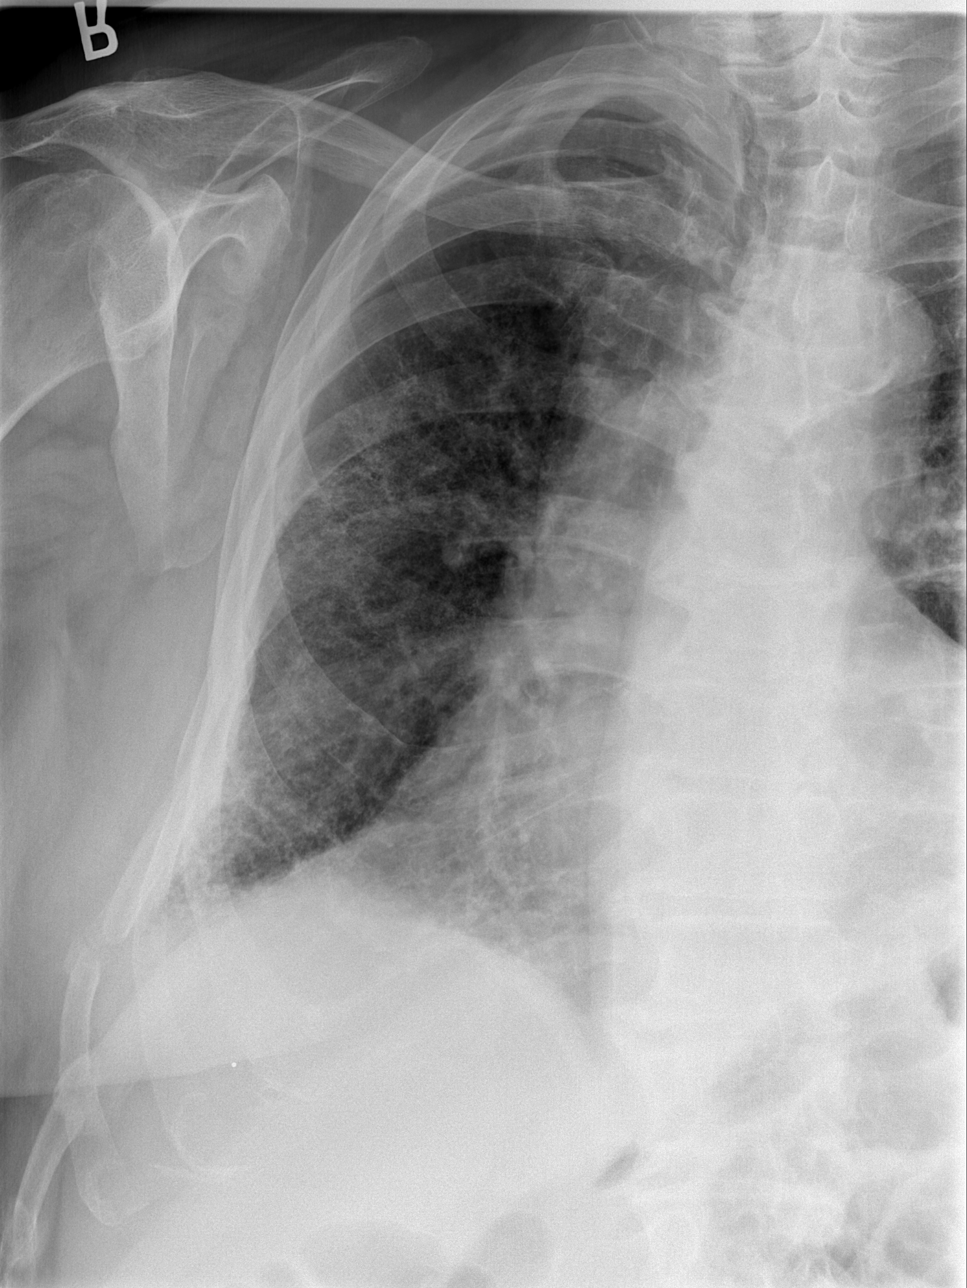

[w ribs oblique right *]
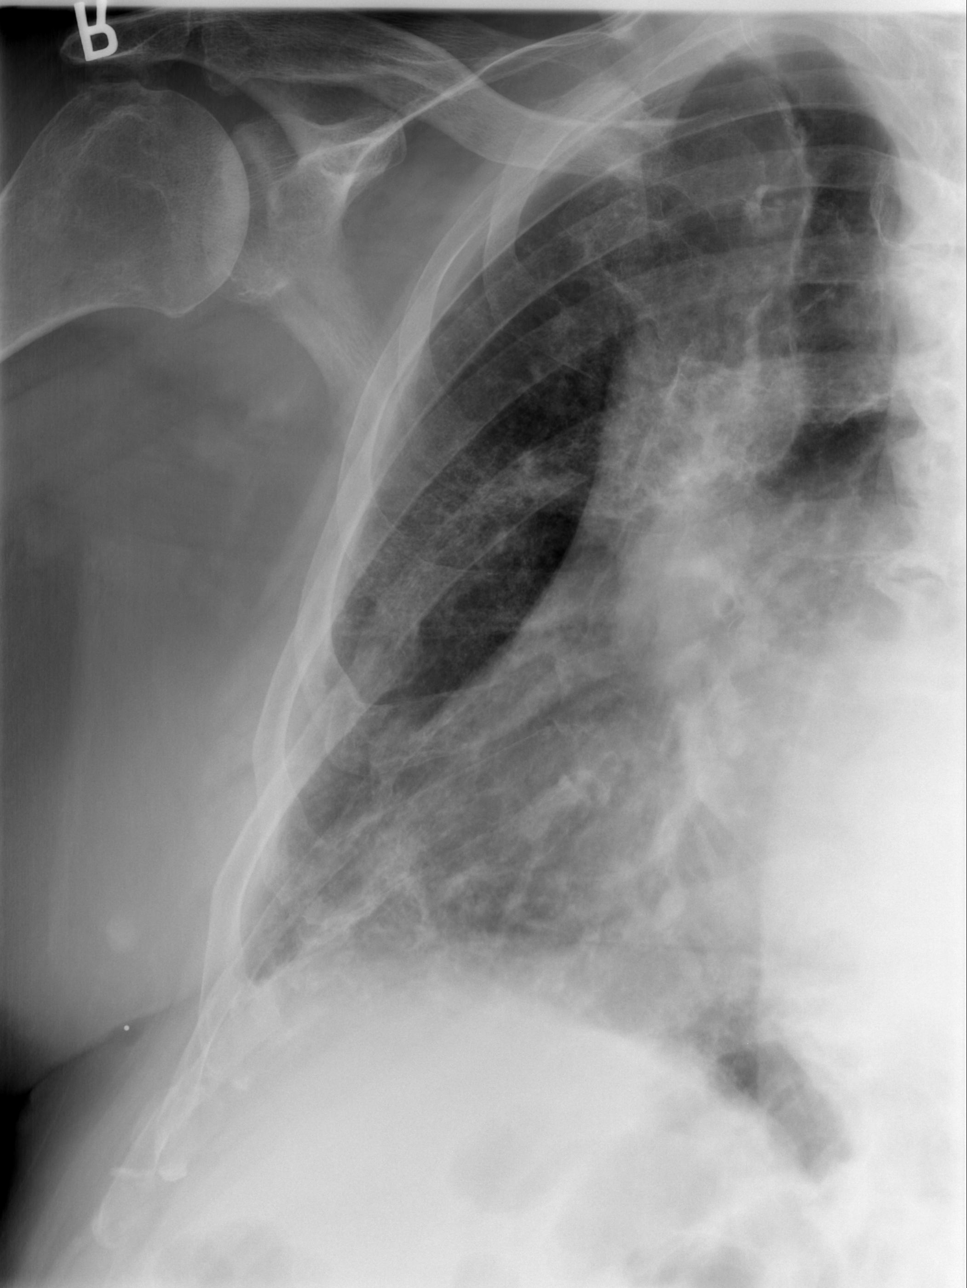

[3 of 3 positions shown; findings below may reference images not displayed]

FINDINGS: Chronic interstitial changes are stable right greater
than left.  No focal infiltrate or effusion is seen.  Moderate
cardiomegaly is stable.  The previously noted fracture of the right
medial clavicle is again noted with some healing.  Old fractures of
the right ninth and tenth ribs are present with callus formation.
IMPRESSION: 1.  Previously noted fibrotic changes throughout the lungs are
stable.  No active process.
2.  Stable cardiomegaly.
3.  Healing medial right clavicle fracture.
4.  Callus around healed or healing right anterior ninth and tenth
rib fractures.

## 2013-07-08 ENCOUNTER — Encounter: Payer: Self-pay | Admitting: Family Medicine

## 2013-07-08 ENCOUNTER — Ambulatory Visit (INDEPENDENT_AMBULATORY_CARE_PROVIDER_SITE_OTHER): Payer: Medicare Other | Admitting: Family Medicine

## 2013-07-08 VITALS — BP 132/82 | HR 64 | Wt 253.0 lb

## 2013-07-08 DIAGNOSIS — E1149 Type 2 diabetes mellitus with other diabetic neurological complication: Secondary | ICD-10-CM

## 2013-07-08 DIAGNOSIS — F329 Major depressive disorder, single episode, unspecified: Secondary | ICD-10-CM

## 2013-07-08 DIAGNOSIS — E039 Hypothyroidism, unspecified: Secondary | ICD-10-CM

## 2013-07-08 DIAGNOSIS — R5381 Other malaise: Secondary | ICD-10-CM

## 2013-07-08 DIAGNOSIS — F32A Depression, unspecified: Secondary | ICD-10-CM

## 2013-07-08 DIAGNOSIS — E1142 Type 2 diabetes mellitus with diabetic polyneuropathy: Secondary | ICD-10-CM

## 2013-07-08 DIAGNOSIS — R5383 Other fatigue: Secondary | ICD-10-CM

## 2013-07-08 DIAGNOSIS — F3289 Other specified depressive episodes: Secondary | ICD-10-CM

## 2013-07-08 NOTE — Progress Notes (Signed)
   Subjective:    Patient ID: Zachary Vargas, male    DOB: November 30, 1927, 80 y.o.   MRN: [id number]  HPI He is here for an interval evaluation. He states that the that the milligrams of nortriptyline has helped tremendously with his stinging sensation in his feet. He also states that his depression is much better now he is back on Paxil. He continues to have itching and uses Atarax usually twice per day. He is not on any sleep meds. He mainly complains of weakness and slight cough but no nausea, vomiting, diarrhea, fever, chills or congestion.   Review of Systems     Objective:   Physical Exam alert and in no distress. Tympanic membranes and canals are normal. Throat is clear. Tonsils are normal. Neck is supple without adenopathy or thyromegaly. Cardiac exam shows a regular sinus rhythm without murmurs or gallops. Lungs are clear to auscultation.        Assessment & Plan:  Fatigue - Plan: TSH, Sedimentation rate  Depression  Hypothyroidism  Diabetic peripheral neuropathy associated with type 2 diabetes mellitus  the etiology of his symptoms is unclear. I will check another TSH to be complete and also a sedimentation rate to see if there is any inflammatory process going on. I also discussed medications he is on it does have a CNS affect. He is aware of this but would like to stay on the nortriptyline and the Atarax.

## 2013-07-08 NOTE — Telephone Encounter (Signed)
Did you want a refill on this med?

## 2013-07-09 LAB — SEDIMENTATION RATE: Sed Rate: 27 mm/hr — ABNORMAL HIGH (ref 0–16)

## 2013-07-09 LAB — TSH: TSH: 8.406 u[IU]/mL — AB (ref 0.350–4.500)

## 2013-07-09 MED ORDER — LEVOTHYROXINE SODIUM 100 MCG PO TABS: 100.0000 ug | ORAL_TABLET | Freq: Every day | ORAL | Status: DC

## 2013-07-09 NOTE — Addendum Note (Signed)
Addended by: Denita Lung on: 07/09/2013 12:34 PM   Modules accepted: Orders, Medications

## 2013-07-10 ENCOUNTER — Telehealth: Payer: Self-pay | Admitting: Family Medicine

## 2013-07-10 NOTE — Telephone Encounter (Signed)
Spoke with pt

## 2013-07-17 ENCOUNTER — Telehealth: Payer: Self-pay | Admitting: Family Medicine

## 2013-07-17 MED ORDER — ZOLPIDEM TARTRATE 5 MG PO TABS: 5.0000 mg | ORAL_TABLET | Freq: Every evening | ORAL | Status: DC | PRN

## 2013-07-17 NOTE — Telephone Encounter (Signed)
Pt called and states he needs refill of Ambien 5 mg to [facility] on [address].  Pt ph [phone]

## 2013-07-17 NOTE — Telephone Encounter (Signed)
Okay to renew

## 2013-07-17 NOTE — Telephone Encounter (Signed)
Called in medication.

## 2013-07-24 ENCOUNTER — Ambulatory Visit: Payer: Medicare Other | Admitting: Family Medicine

## 2013-07-25 ENCOUNTER — Encounter: Payer: Self-pay | Admitting: Family Medicine

## 2013-07-25 ENCOUNTER — Ambulatory Visit (INDEPENDENT_AMBULATORY_CARE_PROVIDER_SITE_OTHER): Payer: Medicare Other | Admitting: Family Medicine

## 2013-07-25 VITALS — BP 118/70 | HR 64 | Ht 75.0 in | Wt 256.0 lb

## 2013-07-25 DIAGNOSIS — M1990 Unspecified osteoarthritis, unspecified site: Secondary | ICD-10-CM

## 2013-07-25 DIAGNOSIS — G609 Hereditary and idiopathic neuropathy, unspecified: Secondary | ICD-10-CM

## 2013-07-25 DIAGNOSIS — M129 Arthropathy, unspecified: Secondary | ICD-10-CM

## 2013-07-25 DIAGNOSIS — M1A00X1 Idiopathic chronic gout, unspecified site, with tophus (tophi): Secondary | ICD-10-CM

## 2013-07-25 DIAGNOSIS — G629 Polyneuropathy, unspecified: Secondary | ICD-10-CM

## 2013-07-25 DIAGNOSIS — M48061 Spinal stenosis, lumbar region without neurogenic claudication: Secondary | ICD-10-CM

## 2013-07-25 MED ORDER — ZOLPIDEM TARTRATE 5 MG PO TABS: 5.0000 mg | ORAL_TABLET | Freq: Every evening | ORAL | Status: DC | PRN

## 2013-07-25 NOTE — Progress Notes (Signed)
   Subjective:    Patient ID: Zachary Vargas, male    DOB: May 15, 1928, 80 y.o.   MRN: [id number]  HPI He is here for consult concerning a power wheelchair. He has an underlying history of gout, arthritis, spinal stenosis and peripheral neuropathy. He does have trouble with ADL's in his home specifically moving from room to room. He has fallen on several occasions at home. He has removed rugs and does have night lights as well as railing in his bathroom.   Review of Systems     Objective:   Physical Exam Alert and in no distress otherwise not examined       Assessment & Plan:  Spinal stenosis of lumbar region - Plan: Ambulatory referral to Physical Therapy  Arthritis - Plan: Ambulatory referral to Physical Therapy  Chronic gouty arthropathy with tophus (tophi) - Plan: Ambulatory referral to Physical Therapy  Peripheral neuropathy - Plan: Ambulatory referral to Physical Therapy  I would like physical therapy to do a functional assessment of need for power wheelchair and also give him exercises to help build his strength Over 20 minutes spent discussing this whole issue with him. Strongly encouraged him to keep himself physically as strong as possible.

## 2013-07-30 ENCOUNTER — Telehealth: Payer: Self-pay | Admitting: Family Medicine

## 2013-07-30 NOTE — Telephone Encounter (Signed)
Pt wanted to let you know he fell again, he had to call EMS to come help him get up.  I asked him if he needed to be seen for the fall.  He states no just bumps & bruises.  He had questions about his thyroid meds,  He voiced understand he is to return around end of month to [facility].

## 2013-07-31 ENCOUNTER — Encounter: Payer: Self-pay | Admitting: Family Medicine

## 2013-08-06 ENCOUNTER — Encounter: Payer: Self-pay | Admitting: Family Medicine

## 2013-08-06 ENCOUNTER — Telehealth: Payer: Self-pay | Admitting: Family Medicine

## 2013-08-06 ENCOUNTER — Other Ambulatory Visit: Payer: Medicare Other

## 2013-08-06 DIAGNOSIS — E039 Hypothyroidism, unspecified: Secondary | ICD-10-CM

## 2013-08-06 LAB — TSH: TSH: 5.576 u[IU]/mL — ABNORMAL HIGH (ref 0.350–4.500)

## 2013-08-06 NOTE — Telephone Encounter (Signed)
After pt left this morning after falling, I followed up with his son [name] [phone] to let him know that pt refused to go to er.  He said he would follow up with him.  I have called [facility] [phone] t/w [name] advised we needed someone to go out asap to evaluate this patient home.  She said if he had a er visit that they would make contact with in 72 hours if not then up to 10 days.  I explained 10 days was not sufficient and if they could not do the 72 hours to let me know and we would do something different.  [name] will call me back.  I called [name] again just now [phone] and he said he just ate and was sitting back in his chair.  That his neighbor did get him some fruit as he needed.  I also made him aware we have ordered a home health assessment for him and to expect the call and he said ok.

## 2013-08-06 NOTE — Telephone Encounter (Signed)
Aydien called and asked for the number to life alert.  We have brochures for [employer] [phone] ext [phone].  Koah called them and then called me back and said he should have his lifeline by Tues or Wednesday.  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

## 2013-08-06 NOTE — Progress Notes (Signed)
   Subjective:    Patient ID: Zachary Vargas, male    DOB: July 17, 1927, 80 y.o.   MRN: [id number]  HPI    Review of Systems     Objective:   Physical Exam        Assessment & Plan:  He came in for blood work today. When he went out to his car he apparently fell trying to get in the car. He has fallen twice today, once yesterday and once last week. Explained that I wanted him to go to the hospital to get further evaluation because of his very concerned about his falls and did not want him to go home. I called EMS and he refused to go. He did cause when he got home to let us know he was safe. We will get [facility] to make a housecall to assess his functional needs.

## 2013-08-07 ENCOUNTER — Other Ambulatory Visit: Payer: Self-pay | Admitting: Family Medicine

## 2013-08-07 MED ORDER — LEVOTHYROXINE SODIUM 112 MCG PO TABS: 112.0000 ug | ORAL_TABLET | Freq: Every day | ORAL | Status: DC

## 2013-08-08 ENCOUNTER — Telehealth: Payer: Self-pay | Admitting: Family Medicine

## 2013-08-08 MED ORDER — GLIPIZIDE 5 MG PO TABS: ORAL_TABLET | ORAL | Status: DC

## 2013-08-08 NOTE — Telephone Encounter (Signed)
Sent in meds 

## 2013-08-08 NOTE — Telephone Encounter (Signed)
Pt called and stated that he needs refill on [facility]

## 2013-08-12 ENCOUNTER — Telehealth: Payer: Self-pay | Admitting: Family Medicine

## 2013-08-12 ENCOUNTER — Telehealth: Payer: Self-pay

## 2013-08-12 NOTE — Telephone Encounter (Signed)
Have home health, on [place] take care of Jahsir.

## 2013-08-12 NOTE — Telephone Encounter (Signed)
Faxed over referral to [facility] [phone] and req status of first appt.

## 2013-08-12 NOTE — Telephone Encounter (Signed)
Rose from [facility] states Zachary Vargas has an abrasion on his left arm that is bleeding bad and they are not able to change the bandage everyday and she would like to know if you can have HomeHealth come in and change this bandage more often b/c [name] is not able to do this and [name] only comes in once a month. She would like to know if you can put in an order to have Home Health come out and change this daily for him

## 2013-08-12 NOTE — Telephone Encounter (Signed)
Faxed referral to [facility] for accessing needs and daily bandage

## 2013-08-14 ENCOUNTER — Other Ambulatory Visit: Payer: Self-pay

## 2013-08-14 ENCOUNTER — Telehealth: Payer: Self-pay | Admitting: Family Medicine

## 2013-08-14 NOTE — Telephone Encounter (Signed)
[facility] called and said they are full right now and cannot take any new pt on.  Faxed [facility] [phone] referral then called [phone] talked with Aniceto Boss she will call me back.

## 2013-08-15 ENCOUNTER — Telehealth: Payer: Self-pay | Admitting: Family Medicine

## 2013-08-18 ENCOUNTER — Telehealth: Payer: Self-pay | Admitting: Family Medicine

## 2013-08-18 NOTE — Telephone Encounter (Signed)
I called [facility] t/w Cheri.  She states the nurse Cheri spoke with [facility] and will be going out there Tuesday as he had too much to do this weekend.  She will send [place] a report when she goes out.

## 2013-08-21 ENCOUNTER — Ambulatory Visit: Payer: Medicare Other | Admitting: Physical Therapy

## 2013-08-21 ENCOUNTER — Telehealth: Payer: Self-pay | Admitting: Internal Medicine

## 2013-08-21 NOTE — Telephone Encounter (Signed)
Buzz richards from [employer] called stating that pt is having some weeping edema and wanting to know if we can put a una boot on his right left for 2 weeks.   Spoke with Dr. Redmond School and He is okay with doing the UNA BOOT.   Called and spoke back with Buzz and gave him a verbal ok

## 2013-08-22 NOTE — Telephone Encounter (Signed)
LM

## 2013-08-29 ENCOUNTER — Telehealth: Payer: Self-pay | Admitting: Family Medicine

## 2013-08-29 NOTE — Telephone Encounter (Signed)
Pt would like a rx or a potty chair. He states if he gets one his insurance will pay for. Please mail to pt's home address.

## 2013-09-01 NOTE — Telephone Encounter (Signed)
rx for beside commode mailed to pt's address.

## 2013-09-01 NOTE — Telephone Encounter (Signed)
Please arrange this

## 2013-09-02 ENCOUNTER — Telehealth: Payer: Self-pay | Admitting: Family Medicine

## 2013-09-02 DIAGNOSIS — G609 Hereditary and idiopathic neuropathy, unspecified: Secondary | ICD-10-CM

## 2013-09-02 MED ORDER — NORTRIPTYLINE HCL 50 MG PO CAPS: 50.0000 mg | ORAL_CAPSULE | Freq: Every day | ORAL | Status: DC

## 2013-09-02 NOTE — Telephone Encounter (Signed)
Nortriptyline renewed 

## 2013-09-08 ENCOUNTER — Telehealth: Payer: Self-pay | Admitting: Family Medicine

## 2013-09-08 NOTE — Telephone Encounter (Signed)
[facility] was too full to take new pt.  So [facility] [phone] is taking care of Zachary Vargas.

## 2013-09-08 NOTE — Telephone Encounter (Signed)
I called [facility] [phone] spoke with [facility] for an update.  She will fax over notes.  She also states no skilled therapy needed at this time.  I asked her about his abraisions on his left arm that were bleeding badly and needed daily bandage changed per [facility] and she did not know.  She will have the nurse named BUZZ call me.

## 2013-09-09 ENCOUNTER — Telehealth: Payer: Self-pay | Admitting: Family Medicine

## 2013-09-09 NOTE — Telephone Encounter (Signed)
Buzz the nurse from [facility] called and states pt is doing well.  His legs are now down to 1/3 of their size since swelling is gone.  Bleeding is under control on his arms.  No longer home bound.  Doing very well.[place] can no longer go out since he is mobile.

## 2013-09-26 ENCOUNTER — Other Ambulatory Visit (HOSPITAL_COMMUNITY): Payer: Self-pay | Admitting: Orthopedic Surgery

## 2013-10-01 ENCOUNTER — Encounter (HOSPITAL_COMMUNITY): Payer: Self-pay | Admitting: Pharmacy Technician

## 2013-10-02 ENCOUNTER — Ambulatory Visit: Payer: Medicare Other | Admitting: Physical Therapy

## 2013-10-08 NOTE — Pre-Procedure Instructions (Signed)
Zachary Vargas  10/08/2013   Your procedure is scheduled on:  Wed, May 6 @ 8:30 AM  Report to Zachary Vargas Entrance A  at 6:30 AM.  Call this number if you have problems the morning of surgery: [phone]   Remember:   Do not eat food or drink liquids after midnight.   Take these medicines the morning of surgery with A SIP OF WATER: Colchicine(Colcrys),Eye Drops,Uloric(Febuxostat),Pain Pill(if needed),Synthroid(Levothyroxine),Pantoprazole(Protonix),and Paxil(Paroxetine)               Stop taking your Fish Oil. No Goody's,BC's,Aleve,Ibuprofen,Fish Oil,Aspirin,or any Herbal Medications   Do not wear jewelry  Do not wear lotions, powders, or colognes. You may wear deodorant.  Men may shave face and neck.  Do not bring valuables to the hospital.  [facility] is not responsible                  for any belongings or valuables.               Contacts, dentures or bridgework may not be worn into surgery.  Leave suitcase in the car. After surgery it may be brought to your room.  For patients admitted to the hospital, discharge time is determined by your                treatment team.                Special Instructions:  [facility] - Preparing for Surgery  Before surgery, you can play an important role.  Because skin is not sterile, your skin needs to be as free of germs as possible.  You can reduce the number of germs on you skin by washing with CHG (chlorahexidine gluconate) soap before surgery.  CHG is an antiseptic cleaner which kills germs and bonds with the skin to continue killing germs even after washing.  Please DO NOT use if you have an allergy to CHG or antibacterial soaps.  If your skin becomes reddened/irritated stop using the CHG and inform your nurse when you arrive at Short Stay.  Do not shave (including legs and underarms) for at least 48 hours prior to the first CHG shower.  You may shave your face.  Please follow these instructions carefully:   1.  Shower with CHG Soap  the night before surgery and the                                morning of Surgery.  2.  If you choose to wash your hair, wash your hair first as usual with your       normal shampoo.  3.  After you shampoo, rinse your hair and body thoroughly to remove the                      Shampoo.  4.  Use CHG as you would any other liquid soap.  You can apply chg directly       to the skin and wash gently with scrungie or a clean washcloth.  5.  Apply the CHG Soap to your body ONLY FROM THE NECK DOWN.        Do not use on open wounds or open sores.  Avoid contact with your eyes,       ears, mouth and genitals (private parts).  Wash genitals (private parts)       with your normal soap.  6.  Wash thoroughly, paying special attention to the area where your surgery        will be performed.  7.  Thoroughly rinse your body with warm water from the neck down.  8.  DO NOT shower/wash with your normal soap after using and rinsing off       the CHG Soap.  9.  Pat yourself dry with a clean towel.            10.  Wear clean pajamas.            11.  Place clean sheets on your bed the night of your first shower and do not        sleep with pets.  Day of Surgery  Do not apply any lotions/deoderants the morning of surgery.  Please wear clean clothes to the hospital/surgery center.     Please read over the following fact sheets that you were given: Pain Booklet, Coughing and Deep Breathing, Blood Transfusion Information, MRSA Information and Surgical Site Infection Prevention

## 2013-10-09 ENCOUNTER — Encounter (HOSPITAL_COMMUNITY): Payer: Self-pay

## 2013-10-09 ENCOUNTER — Encounter (HOSPITAL_COMMUNITY)
Admission: RE | Admit: 2013-10-09 | Discharge: 2013-10-09 | Disposition: A | Discharge status: Home / Self Care | Payer: Medicare Other | Source: Ambulatory Visit | Attending: Orthopedic Surgery | Admitting: Orthopedic Surgery

## 2013-10-09 DIAGNOSIS — Z01812 Encounter for preprocedural laboratory examination: Secondary | ICD-10-CM | POA: Diagnosis present

## 2013-10-09 HISTORY — DX: Localized edema: R60.0

## 2013-10-09 HISTORY — DX: Dorsalgia, unspecified: M54.9

## 2013-10-09 HISTORY — DX: Effusion, unspecified joint: M25.40

## 2013-10-09 HISTORY — DX: Other chronic pain: G89.29

## 2013-10-09 HISTORY — DX: Edema, unspecified: R60.9

## 2013-10-09 HISTORY — DX: Gastro-esophageal reflux disease without esophagitis: K21.9

## 2013-10-09 HISTORY — DX: Pain in unspecified joint: M25.50

## 2013-10-09 HISTORY — DX: Polyneuropathy, unspecified: G62.9

## 2013-10-09 LAB — SURGICAL PCR SCREEN
MRSA, PCR: NEGATIVE
Staphylococcus aureus: NEGATIVE

## 2013-10-09 LAB — COMPREHENSIVE METABOLIC PANEL
ALBUMIN: 3.2 g/dL — AB (ref 3.5–5.2)
ALT: 18 U/L (ref 0–53)
AST: 15 U/L (ref 0–37)
Alkaline Phosphatase: 88 U/L (ref 39–117)
BUN: 61 mg/dL — ABNORMAL HIGH (ref 6–23)
CALCIUM: 9.4 mg/dL (ref 8.4–10.5)
CO2: 20 mEq/L (ref 19–32)
CREATININE: 2.01 mg/dL — AB (ref 0.50–1.35)
Chloride: 105 mEq/L (ref 96–112)
GFR calc Af Amer: 34 mL/min — ABNORMAL LOW (ref >90)
GFR calc non Af Amer: 29 mL/min — ABNORMAL LOW (ref >90)
Glucose, Bld: 86 mg/dL (ref 70–99)
Potassium: 5.7 mEq/L — ABNORMAL HIGH (ref 3.7–5.3)
Sodium: 139 mEq/L (ref 137–147)
TOTAL PROTEIN: 6.4 g/dL (ref 6.0–8.3)
Total Bilirubin: 0.3 mg/dL (ref 0.3–1.2)

## 2013-10-09 LAB — CBC
HEMATOCRIT: 31.1 % — AB (ref 39.0–52.0)
HEMOGLOBIN: 10.2 g/dL — AB (ref 13.0–17.0)
MCH: 32.9 pg (ref 26.0–34.0)
MCHC: 32.8 g/dL (ref 30.0–36.0)
MCV: 100.3 fL — AB (ref 78.0–100.0)
Platelets: 143 10*3/uL — ABNORMAL LOW (ref 150–400)
RBC: 3.1 MIL/uL — ABNORMAL LOW (ref 4.22–5.81)
RDW: 13.8 % (ref 11.5–15.5)
WBC: 5.8 10*3/uL (ref 4.0–10.5)

## 2013-10-09 LAB — PROTIME-INR
INR: 1.06 (ref 0.00–1.49)
PROTHROMBIN TIME: 13.6 s (ref 11.6–15.2)

## 2013-10-09 LAB — APTT: APTT: 31 s (ref 24–37)

## 2013-10-09 NOTE — Progress Notes (Signed)
Pt doesn't have a cardiologist  Stress test done > 82yrs ago  Echo done > 36yrs ago  Denies ever having a heart cath  CXR in epic from 11-29-12  EKG in epic from 05-07-13  [place] with [facility] is Medical Md

## 2013-10-10 NOTE — Progress Notes (Addendum)
Anesthesia chart review: Patient is an 80 year old male scheduled for right TKA on 10/15/13 by Dr. Sharol Given.    History includes former smoker, glaucoma, dyslipidemia, BPH, peptic ulcer disease, diverticulosis, PAD s/p left toe amputations '14, carotid artery stenosis (50-69% bilateral '09), AAA s/p repair '99, IgA related myeloma '09 (reported saw Dr. Marin Olp; patient denies diagnosis of "cancer"), hypertension, pulmonary disease (fibrotic pulmonary parenchymal changes since at least 04/2008 on CXR), diabetes mellitus type 2, anemia, anxiety, hypertension, peripheral edema, peripheral neuropathy, chronic back pain, chronic kidney disease stage III, hypothyroidism, GERD. BMI is 31.87 consistent with obesity. PCP is Dr. Jill Alexanders.  Nephrologist is Dr. Pearson Grippe who is aware of plans for surgery and felt patient could proceed from a renal standpoint.  EKG on 05/07/13 showed SR, anteroseptal infarct (age undetermined).  Reverse r wave progression in V1-2, consider lead reversal, otherwise not significantly changed since prior EKGs on 12/22/09 and 12/29/12.  CXR on 11/29/12 showed: Stable fibrotic change and architectural distortion without acute cardiopulmonary disease.  Preoperative labs noted.  K 5.7, BUN 60, Cr 2.01.  (Previously BUN/Cr 60/1.86 with Cr range from 1.5-2.38 since 10/03/12.) H/H 10.2/31.1. PLT 143K. PT/PTT WNL.  Glucose was 86. I called and left a voice message with Malachy Mood at Dr. Jess Barters office regarding abnormal lab results.  I will order an ISTAT 8 on arrival to ensure stability/improvement of renal function and hyperkalemia. (Update: Cheryl received message and will have Dr. Sharol Given review labs.)  I reviewed above with anesthesiologist Dr. Albertina Parr.  Since renal function appears within baseline and he has been cleared by renal then anticipate that he can proceed as planned if no acute changes or active CV/CHF symptoms.  I did call and speak with patient.  He denies chest pain, SOB.  His  peripheral edema has resolved since he underwent toe amputations for diabetic foot wound.  He is not able to be very active due to severe back pain. He denies NSAID use or KCL supplements.  I did discuss his K, Cr results and told him that we would need to recheck these on the day of surgery to ensure stability.  [facility] Short Stay Center/Anesthesiology Phone [phone] 10/10/2013 4:42 PM

## 2013-10-14 ENCOUNTER — Other Ambulatory Visit: Payer: Self-pay | Admitting: Orthopedic Surgery

## 2013-10-14 DIAGNOSIS — M545 Low back pain, unspecified: Secondary | ICD-10-CM

## 2013-10-14 MED ORDER — CEFAZOLIN SODIUM-DEXTROSE 2-3 GM-% IV SOLR
2.0000 g | INTRAVENOUS | Status: AC
  Administered 2013-10-15: 2 g via INTRAVENOUS
  Filled 2013-10-14: qty 50

## 2013-10-15 ENCOUNTER — Inpatient Hospital Stay (HOSPITAL_COMMUNITY): Payer: Medicare Other | Admitting: Anesthesiology

## 2013-10-15 ENCOUNTER — Encounter (HOSPITAL_COMMUNITY)
Admission: RE | Disposition: A | Discharge status: Skilled Nursing Facility | Payer: Self-pay | Source: Ambulatory Visit | Attending: Orthopedic Surgery

## 2013-10-15 ENCOUNTER — Encounter (HOSPITAL_COMMUNITY): Payer: Medicare Other | Admitting: Vascular Surgery

## 2013-10-15 ENCOUNTER — Inpatient Hospital Stay (HOSPITAL_COMMUNITY)
Admission: RE | Admit: 2013-10-15 | Discharge: 2013-10-19 | DRG: 470 | Disposition: A | Discharge status: Skilled Nursing Facility | Payer: Medicare Other | Source: Ambulatory Visit | Attending: Orthopedic Surgery | Admitting: Orthopedic Surgery

## 2013-10-15 DIAGNOSIS — F3289 Other specified depressive episodes: Secondary | ICD-10-CM | POA: Diagnosis present

## 2013-10-15 DIAGNOSIS — M549 Dorsalgia, unspecified: Secondary | ICD-10-CM | POA: Diagnosis present

## 2013-10-15 DIAGNOSIS — I739 Peripheral vascular disease, unspecified: Secondary | ICD-10-CM | POA: Diagnosis present

## 2013-10-15 DIAGNOSIS — H409 Unspecified glaucoma: Secondary | ICD-10-CM | POA: Diagnosis present

## 2013-10-15 DIAGNOSIS — G8929 Other chronic pain: Secondary | ICD-10-CM | POA: Diagnosis present

## 2013-10-15 DIAGNOSIS — Z87891 Personal history of nicotine dependence: Secondary | ICD-10-CM

## 2013-10-15 DIAGNOSIS — D62 Acute posthemorrhagic anemia: Secondary | ICD-10-CM | POA: Diagnosis not present

## 2013-10-15 DIAGNOSIS — E669 Obesity, unspecified: Secondary | ICD-10-CM | POA: Diagnosis present

## 2013-10-15 DIAGNOSIS — E119 Type 2 diabetes mellitus without complications: Secondary | ICD-10-CM | POA: Diagnosis present

## 2013-10-15 DIAGNOSIS — Z6831 Body mass index (BMI) 31.0-31.9, adult: Secondary | ICD-10-CM

## 2013-10-15 DIAGNOSIS — Z833 Family history of diabetes mellitus: Secondary | ICD-10-CM

## 2013-10-15 DIAGNOSIS — M1710 Unilateral primary osteoarthritis, unspecified knee: Principal | ICD-10-CM | POA: Diagnosis present

## 2013-10-15 DIAGNOSIS — G609 Hereditary and idiopathic neuropathy, unspecified: Secondary | ICD-10-CM | POA: Diagnosis present

## 2013-10-15 DIAGNOSIS — M2540 Effusion, unspecified joint: Secondary | ICD-10-CM | POA: Diagnosis present

## 2013-10-15 DIAGNOSIS — E039 Hypothyroidism, unspecified: Secondary | ICD-10-CM | POA: Diagnosis present

## 2013-10-15 DIAGNOSIS — F329 Major depressive disorder, single episode, unspecified: Secondary | ICD-10-CM | POA: Diagnosis present

## 2013-10-15 DIAGNOSIS — N189 Chronic kidney disease, unspecified: Secondary | ICD-10-CM | POA: Diagnosis present

## 2013-10-15 DIAGNOSIS — K219 Gastro-esophageal reflux disease without esophagitis: Secondary | ICD-10-CM | POA: Diagnosis present

## 2013-10-15 DIAGNOSIS — E785 Hyperlipidemia, unspecified: Secondary | ICD-10-CM | POA: Diagnosis present

## 2013-10-15 DIAGNOSIS — Z96659 Presence of unspecified artificial knee joint: Secondary | ICD-10-CM

## 2013-10-15 DIAGNOSIS — I129 Hypertensive chronic kidney disease with stage 1 through stage 4 chronic kidney disease, or unspecified chronic kidney disease: Secondary | ICD-10-CM | POA: Diagnosis present

## 2013-10-15 DIAGNOSIS — K5730 Diverticulosis of large intestine without perforation or abscess without bleeding: Secondary | ICD-10-CM | POA: Diagnosis present

## 2013-10-15 DIAGNOSIS — M109 Gout, unspecified: Secondary | ICD-10-CM | POA: Diagnosis present

## 2013-10-15 DIAGNOSIS — S98139A Complete traumatic amputation of one unspecified lesser toe, initial encounter: Secondary | ICD-10-CM

## 2013-10-15 HISTORY — PX: TOTAL KNEE ARTHROPLASTY: SHX125

## 2013-10-15 LAB — POCT I-STAT, CHEM 8
BUN: 54 mg/dL — ABNORMAL HIGH (ref 6–23)
CALCIUM ION: 1.29 mmol/L (ref 1.13–1.30)
CHLORIDE: 106 meq/L (ref 96–112)
Creatinine, Ser: 2.4 mg/dL — ABNORMAL HIGH (ref 0.50–1.35)
Glucose, Bld: 112 mg/dL — ABNORMAL HIGH (ref 70–99)
HEMATOCRIT: 31 % — AB (ref 39.0–52.0)
HEMOGLOBIN: 10.5 g/dL — AB (ref 13.0–17.0)
POTASSIUM: 5.4 meq/L — AB (ref 3.7–5.3)
Sodium: 139 mEq/L (ref 137–147)
TCO2: 20 mmol/L (ref 0–100)

## 2013-10-15 LAB — GLUCOSE, CAPILLARY
GLUCOSE-CAPILLARY: 118 mg/dL — AB (ref 70–99)
Glucose-Capillary: 109 mg/dL — ABNORMAL HIGH (ref 70–99)
Glucose-Capillary: 130 mg/dL — ABNORMAL HIGH (ref 70–99)

## 2013-10-15 SURGERY — ARTHROPLASTY, KNEE, TOTAL
Anesthesia: General | Site: Knee | Laterality: Right

## 2013-10-15 MED ORDER — FERROUS GLUCONATE 324 (38 FE) MG PO TABS
324.0000 mg | ORAL_TABLET | Freq: Every day | ORAL | Status: DC
  Administered 2013-10-15 – 2013-10-19 (×5): 324 mg via ORAL
  Filled 2013-10-15 (×6): qty 1

## 2013-10-15 MED ORDER — GLIPIZIDE 5 MG PO TABS
5.0000 mg | ORAL_TABLET | Freq: Every day | ORAL | Status: DC
  Administered 2013-10-16 – 2013-10-19 (×4): 5 mg via ORAL
  Filled 2013-10-15 (×5): qty 1

## 2013-10-15 MED ORDER — ONDANSETRON HCL 4 MG/2ML IJ SOLN
4.0000 mg | Freq: Four times a day (QID) | INTRAMUSCULAR | Status: DC | PRN
  Administered 2013-10-19: 4 mg via INTRAVENOUS
  Filled 2013-10-15: qty 2

## 2013-10-15 MED ORDER — PROMETHAZINE HCL 25 MG/ML IJ SOLN: 6.2500 mg | INTRAMUSCULAR | Status: DC | PRN

## 2013-10-15 MED ORDER — FERROUS GLUCONATE 324 (38 FE) MG PO TABS
325.0000 mg | ORAL_TABLET | Freq: Every day | ORAL | Status: DC
  Filled 2013-10-15 (×2): qty 1

## 2013-10-15 MED ORDER — NORTRIPTYLINE HCL 25 MG PO CAPS
50.0000 mg | ORAL_CAPSULE | Freq: Every day | ORAL | Status: DC
  Administered 2013-10-15 – 2013-10-18 (×4): 50 mg via ORAL
  Filled 2013-10-15 (×5): qty 2

## 2013-10-15 MED ORDER — ZOLPIDEM TARTRATE 5 MG PO TABS
5.0000 mg | ORAL_TABLET | Freq: Every evening | ORAL | Status: DC | PRN
  Administered 2013-10-16: 5 mg via ORAL
  Filled 2013-10-15: qty 1

## 2013-10-15 MED ORDER — CEFAZOLIN SODIUM-DEXTROSE 2-3 GM-% IV SOLR
2.0000 g | Freq: Two times a day (BID) | INTRAVENOUS | Status: AC
  Administered 2013-10-15 – 2013-10-16 (×2): 2 g via INTRAVENOUS
  Filled 2013-10-15 (×2): qty 50

## 2013-10-15 MED ORDER — METOCLOPRAMIDE HCL 5 MG/ML IJ SOLN: 5.0000 mg | Freq: Three times a day (TID) | INTRAMUSCULAR | Status: DC | PRN

## 2013-10-15 MED ORDER — ACETAMINOPHEN 650 MG RE SUPP: 650.0000 mg | Freq: Four times a day (QID) | RECTAL | Status: DC | PRN

## 2013-10-15 MED ORDER — PROPOFOL 10 MG/ML IV BOLUS
INTRAVENOUS | Status: AC
  Filled 2013-10-15: qty 20

## 2013-10-15 MED ORDER — LEVOTHYROXINE SODIUM 50 MCG PO TABS
50.0000 ug | ORAL_TABLET | Freq: Every day | ORAL | Status: DC
  Administered 2013-10-16 – 2013-10-19 (×4): 50 ug via ORAL
  Filled 2013-10-15 (×5): qty 1

## 2013-10-15 MED ORDER — NEOSTIGMINE METHYLSULFATE 10 MG/10ML IV SOLN
INTRAVENOUS | Status: AC
  Filled 2013-10-15: qty 1

## 2013-10-15 MED ORDER — FENTANYL CITRATE 0.05 MG/ML IJ SOLN
INTRAMUSCULAR | Status: AC
  Filled 2013-10-15: qty 5

## 2013-10-15 MED ORDER — FENTANYL CITRATE 0.05 MG/ML IJ SOLN
INTRAMUSCULAR | Status: DC | PRN
  Administered 2013-10-15 (×3): 50 ug via INTRAVENOUS

## 2013-10-15 MED ORDER — ASPIRIN EC 325 MG PO TBEC
325.0000 mg | DELAYED_RELEASE_TABLET | Freq: Every day | ORAL | Status: DC
  Administered 2013-10-16 – 2013-10-19 (×4): 325 mg via ORAL
  Filled 2013-10-15 (×5): qty 1

## 2013-10-15 MED ORDER — COLCHICINE 0.6 MG PO TABS
0.6000 mg | ORAL_TABLET | Freq: Every day | ORAL | Status: DC
  Administered 2013-10-16 – 2013-10-18 (×3): 0.6 mg via ORAL
  Filled 2013-10-15 (×3): qty 1

## 2013-10-15 MED ORDER — PANTOPRAZOLE SODIUM 20 MG PO TBEC
20.0000 mg | DELAYED_RELEASE_TABLET | Freq: Every day | ORAL | Status: DC
  Administered 2013-10-16 – 2013-10-19 (×5): 20 mg via ORAL
  Filled 2013-10-15 (×6): qty 1

## 2013-10-15 MED ORDER — HYDROMORPHONE HCL PF 1 MG/ML IJ SOLN
INTRAMUSCULAR | Status: AC
  Administered 2013-10-15: 0.5 mg via INTRAVENOUS
  Filled 2013-10-15: qty 1

## 2013-10-15 MED ORDER — OXYCODONE HCL 5 MG/5ML PO SOLN: 5.0000 mg | Freq: Once | ORAL | Status: AC | PRN

## 2013-10-15 MED ORDER — SUCCINYLCHOLINE CHLORIDE 20 MG/ML IJ SOLN
INTRAMUSCULAR | Status: DC | PRN
  Administered 2013-10-15: 100 mg via INTRAVENOUS

## 2013-10-15 MED ORDER — SODIUM CHLORIDE 0.9 % IJ SOLN
INTRAMUSCULAR | Status: AC
  Filled 2013-10-15: qty 3

## 2013-10-15 MED ORDER — DEXTROSE 5 % IV SOLN
500.0000 mg | Freq: Four times a day (QID) | INTRAVENOUS | Status: DC | PRN
  Filled 2013-10-15: qty 5

## 2013-10-15 MED ORDER — MIDAZOLAM HCL 2 MG/2ML IJ SOLN
INTRAMUSCULAR | Status: AC
  Filled 2013-10-15: qty 2

## 2013-10-15 MED ORDER — SODIUM CHLORIDE 0.9 % IR SOLN
Status: DC | PRN
  Administered 2013-10-15 (×3): 1000 mL

## 2013-10-15 MED ORDER — LISINOPRIL 10 MG PO TABS
10.0000 mg | ORAL_TABLET | Freq: Every day | ORAL | Status: DC
  Administered 2013-10-16: 10 mg via ORAL
  Filled 2013-10-15 (×4): qty 1

## 2013-10-15 MED ORDER — ROCURONIUM BROMIDE 50 MG/5ML IV SOLN
INTRAVENOUS | Status: AC
  Filled 2013-10-15: qty 1

## 2013-10-15 MED ORDER — FUROSEMIDE 40 MG PO TABS
40.0000 mg | ORAL_TABLET | Freq: Every day | ORAL | Status: DC
  Administered 2013-10-15 – 2013-10-19 (×4): 40 mg via ORAL
  Filled 2013-10-15 (×5): qty 1

## 2013-10-15 MED ORDER — LIDOCAINE HCL (CARDIAC) 20 MG/ML IV SOLN
INTRAVENOUS | Status: DC | PRN
  Administered 2013-10-15: 80 mg via INTRAVENOUS

## 2013-10-15 MED ORDER — METOCLOPRAMIDE HCL 10 MG PO TABS: 5.0000 mg | ORAL_TABLET | Freq: Three times a day (TID) | ORAL | Status: DC | PRN

## 2013-10-15 MED ORDER — OXYCODONE HCL 5 MG PO TABS
5.0000 mg | ORAL_TABLET | Freq: Once | ORAL | Status: AC | PRN
  Administered 2013-10-15: 5 mg via ORAL

## 2013-10-15 MED ORDER — 0.9 % SODIUM CHLORIDE (POUR BTL) OPTIME
TOPICAL | Status: DC | PRN
  Administered 2013-10-15: 1000 mL

## 2013-10-15 MED ORDER — PROPOFOL 10 MG/ML IV BOLUS
INTRAVENOUS | Status: DC | PRN
  Administered 2013-10-15: 150 mg via INTRAVENOUS

## 2013-10-15 MED ORDER — METHOCARBAMOL 500 MG PO TABS
ORAL_TABLET | ORAL | Status: AC
  Administered 2013-10-15: 500 mg via ORAL
  Filled 2013-10-15: qty 1

## 2013-10-15 MED ORDER — LIDOCAINE HCL (CARDIAC) 20 MG/ML IV SOLN
INTRAVENOUS | Status: AC
  Filled 2013-10-15: qty 5

## 2013-10-15 MED ORDER — HYDROMORPHONE HCL PF 1 MG/ML IJ SOLN: 1.0000 mg | INTRAMUSCULAR | Status: DC | PRN

## 2013-10-15 MED ORDER — OXYCODONE HCL 5 MG PO TABS
ORAL_TABLET | ORAL | Status: AC
  Administered 2013-10-15: 5 mg via ORAL
  Filled 2013-10-15: qty 1

## 2013-10-15 MED ORDER — METHOCARBAMOL 500 MG PO TABS
500.0000 mg | ORAL_TABLET | Freq: Four times a day (QID) | ORAL | Status: DC | PRN
  Administered 2013-10-15 (×2): 500 mg via ORAL
  Filled 2013-10-15: qty 1

## 2013-10-15 MED ORDER — LACTATED RINGERS IV SOLN
INTRAVENOUS | Status: DC | PRN
  Administered 2013-10-15 (×2): via INTRAVENOUS

## 2013-10-15 MED ORDER — HYDROMORPHONE HCL PF 1 MG/ML IJ SOLN
0.2500 mg | INTRAMUSCULAR | Status: DC | PRN
Start: 2013-10-15 — End: 2013-10-15
  Administered 2013-10-15 (×2): 0.5 mg via INTRAVENOUS

## 2013-10-15 MED ORDER — BUPIVACAINE-EPINEPHRINE (PF) 0.5% -1:200000 IJ SOLN
INTRAMUSCULAR | Status: DC | PRN
  Administered 2013-10-15: 30 mL

## 2013-10-15 MED ORDER — BUPIVACAINE LIPOSOME 1.3 % IJ SUSP
20.0000 mL | Freq: Once | INTRAMUSCULAR | Status: DC
  Filled 2013-10-15: qty 20

## 2013-10-15 MED ORDER — DORZOLAMIDE HCL-TIMOLOL MAL 2-0.5 % OP SOLN
1.0000 [drp] | Freq: Two times a day (BID) | OPHTHALMIC | Status: DC
  Administered 2013-10-15 – 2013-10-19 (×7): 1 [drp] via OPHTHALMIC
  Filled 2013-10-15 (×2): qty 10

## 2013-10-15 MED ORDER — HYDROXYZINE HCL 25 MG PO TABS: 25.0000 mg | ORAL_TABLET | Freq: Three times a day (TID) | ORAL | Status: DC | PRN

## 2013-10-15 MED ORDER — PHENOL 1.4 % MT LIQD: 1.0000 | OROMUCOSAL | Status: DC | PRN

## 2013-10-15 MED ORDER — LATANOPROST 0.005 % OP SOLN
1.0000 [drp] | Freq: Every day | OPHTHALMIC | Status: DC
  Administered 2013-10-15 – 2013-10-18 (×4): 1 [drp] via OPHTHALMIC
  Filled 2013-10-15 (×2): qty 2.5

## 2013-10-15 MED ORDER — ONDANSETRON HCL 4 MG PO TABS: 4.0000 mg | ORAL_TABLET | Freq: Four times a day (QID) | ORAL | Status: DC | PRN

## 2013-10-15 MED ORDER — ACETAMINOPHEN 325 MG PO TABS
650.0000 mg | ORAL_TABLET | Freq: Four times a day (QID) | ORAL | Status: DC | PRN
  Administered 2013-10-17 – 2013-10-18 (×2): 650 mg via ORAL
  Filled 2013-10-15 (×3): qty 2

## 2013-10-15 MED ORDER — MENTHOL 3 MG MT LOZG
1.0000 | LOZENGE | OROMUCOSAL | Status: DC | PRN
  Filled 2013-10-15: qty 9

## 2013-10-15 MED ORDER — PHENYLEPHRINE HCL 10 MG/ML IJ SOLN
INTRAMUSCULAR | Status: DC | PRN
  Administered 2013-10-15: 80 ug via INTRAVENOUS
  Administered 2013-10-15: 120 ug via INTRAVENOUS
  Administered 2013-10-15 (×5): 80 ug via INTRAVENOUS

## 2013-10-15 MED ORDER — OXYCODONE HCL 5 MG PO TABS
5.0000 mg | ORAL_TABLET | ORAL | Status: DC | PRN
  Administered 2013-10-15 – 2013-10-16 (×7): 10 mg via ORAL
  Filled 2013-10-15 (×7): qty 2

## 2013-10-15 MED ORDER — MIDAZOLAM HCL 5 MG/5ML IJ SOLN
INTRAMUSCULAR | Status: DC | PRN
  Administered 2013-10-15: 1 mg via INTRAVENOUS

## 2013-10-15 MED ORDER — INSULIN ASPART 100 UNIT/ML ~~LOC~~ SOLN
0.0000 [IU] | Freq: Three times a day (TID) | SUBCUTANEOUS | Status: DC
  Administered 2013-10-15 – 2013-10-17 (×5): 2 [IU] via SUBCUTANEOUS
  Administered 2013-10-17: 3 [IU] via SUBCUTANEOUS

## 2013-10-15 MED ORDER — GLYCOPYRROLATE 0.2 MG/ML IJ SOLN
INTRAMUSCULAR | Status: AC
  Filled 2013-10-15: qty 2

## 2013-10-15 MED ORDER — PAROXETINE HCL 20 MG PO TABS
40.0000 mg | ORAL_TABLET | Freq: Every day | ORAL | Status: DC
  Administered 2013-10-16 – 2013-10-19 (×4): 40 mg via ORAL
  Filled 2013-10-15 (×4): qty 2

## 2013-10-15 MED ORDER — ONDANSETRON HCL 4 MG/2ML IJ SOLN
INTRAMUSCULAR | Status: AC
  Filled 2013-10-15: qty 2

## 2013-10-15 MED ORDER — FEBUXOSTAT 40 MG PO TABS
40.0000 mg | ORAL_TABLET | Freq: Every day | ORAL | Status: DC
  Administered 2013-10-16 – 2013-10-19 (×4): 40 mg via ORAL
  Filled 2013-10-15 (×4): qty 1

## 2013-10-15 MED ORDER — BUPIVACAINE LIPOSOME 1.3 % IJ SUSP
INTRAMUSCULAR | Status: DC | PRN
  Administered 2013-10-15: 20 mL

## 2013-10-15 SURGICAL SUPPLY — 61 items
BLADE SAGITTAL 25.0X1.27X90 (BLADE) ×2 IMPLANT
BLADE SAGITTAL 25.0X1.27X90MM (BLADE) ×1
BLADE SAW SGTL 13.0X1.19X90.0M (BLADE) ×3 IMPLANT
BLADE SURG 21 STRL SS (BLADE) ×6 IMPLANT
BNDG COHESIVE 6X5 TAN STRL LF (GAUZE/BANDAGES/DRESSINGS) ×4 IMPLANT
BNDG GAUZE ELAST 4 BULKY (GAUZE/BANDAGES/DRESSINGS) ×2 IMPLANT
BONE CEMENT PALACOSE (Orthopedic Implant) ×6 IMPLANT
BOWL SMART MIX CTS (DISPOSABLE) ×2 IMPLANT
CAP POR TM CP VIT E LN CER HD ×2 IMPLANT
CEMENT BONE PALACOSE (Orthopedic Implant) ×2 IMPLANT
COVER SURGICAL LIGHT HANDLE (MISCELLANEOUS) ×3 IMPLANT
CUFF TOURNIQUET SINGLE 34IN LL (TOURNIQUET CUFF) ×2 IMPLANT
CUFF TOURNIQUET SINGLE 44IN (TOURNIQUET CUFF) IMPLANT
DRAPE EXTREMITY T 121X128X90 (DRAPE) ×3 IMPLANT
DRAPE PROXIMA HALF (DRAPES) ×3 IMPLANT
DRAPE U-SHAPE 47X51 STRL (DRAPES) ×3 IMPLANT
DRSG ADAPTIC 3X8 NADH LF (GAUZE/BANDAGES/DRESSINGS) ×3 IMPLANT
DRSG PAD ABDOMINAL 8X10 ST (GAUZE/BANDAGES/DRESSINGS) ×3 IMPLANT
DURAPREP 26ML APPLICATOR (WOUND CARE) ×3 IMPLANT
ELECT REM PT RETURN 9FT ADLT (ELECTROSURGICAL) ×3
ELECTRODE REM PT RTRN 9FT ADLT (ELECTROSURGICAL) ×1 IMPLANT
FACESHIELD WRAPAROUND (MASK) ×6 IMPLANT
FACESHIELD WRAPAROUND OR TEAM (MASK) ×1 IMPLANT
GLOVE BIO SURGEON STRL SZ7.5 (GLOVE) ×8 IMPLANT
GLOVE BIOGEL PI IND STRL 7.0 (GLOVE) IMPLANT
GLOVE BIOGEL PI IND STRL 7.5 (GLOVE) IMPLANT
GLOVE BIOGEL PI IND STRL 9 (GLOVE) ×1 IMPLANT
GLOVE BIOGEL PI INDICATOR 7.0 (GLOVE) ×2
GLOVE BIOGEL PI INDICATOR 7.5 (GLOVE) ×2
GLOVE BIOGEL PI INDICATOR 9 (GLOVE) ×2
GLOVE ECLIPSE 6.5 STRL STRAW (GLOVE) ×4 IMPLANT
GLOVE ECLIPSE 7.0 STRL STRAW (GLOVE) ×2 IMPLANT
GLOVE SURG ORTHO 9.0 STRL STRW (GLOVE) ×9 IMPLANT
GOWN STRL REUS W/ TWL XL LVL3 (GOWN DISPOSABLE) ×3 IMPLANT
GOWN STRL REUS W/TWL XL LVL3 (GOWN DISPOSABLE) ×9
HANDPIECE INTERPULSE COAX TIP (DISPOSABLE) ×3
KIT BASIN OR (CUSTOM PROCEDURE TRAY) ×3 IMPLANT
KIT ROOM TURNOVER OR (KITS) ×3 IMPLANT
MANIFOLD NEPTUNE II (INSTRUMENTS) ×3 IMPLANT
NDL SPNL 18GX3.5 QUINCKE PK (NEEDLE) ×1 IMPLANT
NEEDLE SPNL 18GX3.5 QUINCKE PK (NEEDLE) ×3 IMPLANT
NS IRRIG 1000ML POUR BTL (IV SOLUTION) ×3 IMPLANT
PACK TOTAL JOINT (CUSTOM PROCEDURE TRAY) ×3 IMPLANT
PAD ABD 8X10 STRL (GAUZE/BANDAGES/DRESSINGS) ×2 IMPLANT
PAD ARMBOARD 7.5X6 YLW CONV (MISCELLANEOUS) ×6 IMPLANT
PADDING CAST COTTON 6X4 STRL (CAST SUPPLIES) ×3 IMPLANT
SET HNDPC FAN SPRY TIP SCT (DISPOSABLE) IMPLANT
SPONGE GAUZE 4X4 12PLY (GAUZE/BANDAGES/DRESSINGS) ×3 IMPLANT
SPONGE GAUZE 4X4 12PLY STER LF (GAUZE/BANDAGES/DRESSINGS) ×2 IMPLANT
STAPLER VISISTAT 35W (STAPLE) ×3 IMPLANT
SUCTION FRAZIER TIP 10 FR DISP (SUCTIONS) ×2 IMPLANT
SUT VIC AB 0 CTB1 27 (SUTURE) ×4 IMPLANT
SUT VIC AB 1 CTX 36 (SUTURE) ×3
SUT VIC AB 1 CTX36XBRD ANBCTR (SUTURE) IMPLANT
SUT VIC AB 2-0 CT1 27 (SUTURE) ×6
SUT VIC AB 2-0 CT1 TAPERPNT 27 (SUTURE) IMPLANT
SYR 50ML LL SCALE MARK (SYRINGE) ×3 IMPLANT
TOWEL OR 17X24 6PK STRL BLUE (TOWEL DISPOSABLE) ×3 IMPLANT
TOWEL OR 17X26 10 PK STRL BLUE (TOWEL DISPOSABLE) ×3 IMPLANT
WATER STERILE IRR 1000ML POUR (IV SOLUTION) ×2 IMPLANT
WRAP KNEE MAXI GEL POST OP (GAUZE/BANDAGES/DRESSINGS) ×5 IMPLANT

## 2013-10-15 NOTE — Anesthesia Postprocedure Evaluation (Signed)
  Anesthesia Post-op Note  Patient: Zachary Vargas  Procedure(s) Performed: Procedure(s) with comments: TOTAL KNEE ARTHROPLASTY (Right) - Right Total Knee Arthroplasty  Patient Location: PACU  Anesthesia Type:GA combined with regional for post-op pain  Level of Consciousness: awake and alert   Airway and Oxygen Therapy: Patient Spontanous Breathing  Post-op Pain: mild  Post-op Assessment: Post-op Vital signs reviewed  Post-op Vital Signs: stable  Last Vitals:  Filed Vitals:   10/15/13 1130  BP: 125/58  Pulse:   Temp:   Resp:     Complications: No apparent anesthesia complications

## 2013-10-15 NOTE — Transfer of Care (Signed)
Immediate Anesthesia Transfer of Care Note  Patient: Zachary Vargas  Procedure(s) Performed: Procedure(s) with comments: TOTAL KNEE ARTHROPLASTY (Right) - Right Total Knee Arthroplasty  Patient Location: PACU  Anesthesia Type:GA combined with regional for post-op pain  Level of Consciousness: awake, alert  and oriented  Airway & Oxygen Therapy: Patient Spontanous Breathing and Patient connected to nasal cannula oxygen  Post-op Assessment: Report given to PACU RN and Post -op Vital signs reviewed and stable  Post vital signs: Reviewed and stable  Complications: No apparent anesthesia complications

## 2013-10-15 NOTE — H&P (Signed)
TOTAL KNEE ADMISSION H&P  Patient is being admitted for right total knee arthroplasty.  Subjective:  Chief Complaint:right knee pain.  HPI: Zachary Vargas, 80 y.o. male, has a history of pain and functional disability in the right knee due to arthritis and has failed non-surgical conservative treatments for greater than 12 weeks to includeNSAID's and/or analgesics, corticosteriod injections, use of assistive devices, weight reduction as appropriate and activity modification.  Onset of symptoms was gradual, starting 8 years ago with gradually worsening course since that time. The patient noted no past surgery on the right knee(s).  Patient currently rates pain in the right knee(s) at 8 out of 10 with activity. Patient has night pain, worsening of pain with activity and weight bearing, pain that interferes with activities of daily living, pain with passive range of motion, crepitus and joint swelling.  Patient has evidence of subchondral cysts, subchondral sclerosis, periarticular osteophytes and joint space narrowing by imaging studies. This patient has had Failure of conservative care. There is no active infection.  Patient Active Problem List   Diagnosis Date Noted  . Spinal stenosis of lumbar region 07/25/2013  . Peripheral neuropathy 07/25/2013  . Chronic gouty arthropathy with tophus (tophi) 06/03/2013  . Generalized weakness 05/07/2013  . Hypothyroidism 01/16/2012  . Chronic kidney disease, unspecified 01/16/2012  . Anemia, unspecified 01/16/2012  . Dyslipidemia 06/19/2011  . Depression 06/19/2011  . Arthritis 06/19/2011  . Generalized pain 06/19/2011  . URI (upper respiratory infection) 05/16/2011  . Renal insufficiency 10/21/2010  . Type II or unspecified type diabetes mellitus without mention of complication, not stated as uncontrolled 10/21/2010   Past Medical History  Diagnosis Date  . Glaucoma     uses Eye Drops daily  . Dyslipidemia     statin intolerant  . Gout    takes Colchicine and Uloric daily  . BPH (benign prostatic hyperplasia)     [facility]  . History of peptic ulcer disease   . Peptic ulcer disease   . Diverticulosis   . Lumbar spinal stenosis     Most recent MRI 12/ 2011; [facility] - Dr. Nelva Bush  . Carotid atherosclerosis     Dopplers 11/09 showing 50-69% stenosis  . Peripheral arterial disease     [place] health care consult 1/08  . Epigastric pain     Hospitalization 7/11 [place]  . Arthritis     Followed by [facility]  . Myeloma     IgA related; Dr. Marin Olp - 2009  . Headache(784.0)     rarely  . Constipation     but doesn't take any meds  . Urinary frequency   . Diabetes mellitus     takes glipizide daily  . Anxiety     takes Paxil daily  . Insomnia     takes Ambien nightly   . Anemia     of chronic disease;takes Ferrous Gluconate daily  . Osteomyelitis   . Hypertension     takes Lisinopril daily  . Peripheral edema     takes Furosemide daily  . Pneumonia     hx of-about 56yrs ago  . Peripheral neuropathy   . Joint pain   . Joint swelling   . Chronic back pain     spinal stenosis  . Chronic kidney disease     not on dialysis  . Hypothyroidism     takes Synthroid daily  . GERD (gastroesophageal reflux disease)     takes Protonix daily    Past Surgical History  Procedure Laterality Date  . Abdominal aortic aneurysm repair  03/1998  . Abdominal hernia repair  2000  . Renal stone surgery  1999  . Nerve root block      Multiple for lumbar spine  . Colonoscopy  07/22/2004    due repeat 2011; Dr. Teena Irani, [facility]  . Eye surgery      bil cataract surgery  . Amputation Left 11/29/2012    Procedure: Left Foot, Great Toe Amputation at MTP Joint;  Surgeon: Newt Minion, MD;  Location: [place];  Service: Orthopedics;  Laterality: Left;  LEFT  Foot, Great Toe Amputation at MTP Joint  . Amputation Left 01/03/2013    Procedure: Left foot 2nd toe amputation at metatarsophalangeal;  Surgeon: Newt Minion, MD;  Location: [place];  Service: Orthopedics;  Laterality: Left;  . Tonsillectomy    . Vasectomy    . Amputation Left 03/19/2013    Procedure: AMPUTATION DIGIT;  Surgeon: Newt Minion, MD;  Location: [place];  Service: Orthopedics;  Laterality: Left;  Left foot Amputation 3, 4, 5th Toes at MTP joint  . Esophagogastroduodenoscopy      Prescriptions prior to admission  Medication Sig Dispense Refill  . cholecalciferol (VITAMIN D) 1000 UNITS tablet Take 1,000 Units by mouth daily.      . colchicine (COLCRYS) 0.6 MG tablet Take 0.6 mg by mouth daily.       . dorzolamide-timolol (COSOPT) 22.3-6.8 MG/ML ophthalmic solution Place 1 drop into both eyes 2 (two) times daily.       . febuxostat (ULORIC) 40 MG tablet Take 40 mg by mouth daily.      . ferrous gluconate (FERGON) 325 MG tablet Take 325 mg by mouth daily with breakfast.        . furosemide (LASIX) 40 MG tablet Take 1 tablet (40 mg total) by mouth daily.  90 tablet  3  . glipiZIDE (GLUCOTROL) 10 MG tablet Take 5 mg by mouth daily before breakfast.      . HYDROcodone-acetaminophen (NORCO/VICODIN) 5-325 MG per tablet Take 1 tablet by mouth every 6 (six) hours as needed for moderate pain.      . hydrOXYzine (ATARAX/VISTARIL) 25 MG tablet Take 25 mg by mouth 3 (three) times daily.      Marland Kitchen latanoprost (XALATAN) 0.005 % ophthalmic solution Place 1 drop into the right eye at bedtime.        Marland Kitchen levothyroxine (SYNTHROID, LEVOTHROID) 50 MCG tablet Take 50 mcg by mouth daily before breakfast.      . lisinopril (PRINIVIL,ZESTRIL) 10 MG tablet Take 1 tablet (10 mg total) by mouth daily.  90 tablet  1  . Multiple Vitamins-Minerals (MULTIVITAMIN WITH MINERALS) tablet Take 1 tablet by mouth daily.      . nortriptyline (PAMELOR) 50 MG capsule Take 1 capsule (50 mg total) by mouth at bedtime.  90 capsule  3  . Omega-3 Fatty Acids (FISH OIL) 1000 MG CAPS Take 1,000 mg by mouth daily.      . pantoprazole (PROTONIX) 20 MG tablet Take 1 tablet (20 mg total) by  mouth daily.  30 tablet  0  . PARoxetine (PAXIL) 40 MG tablet Take 40 mg by mouth every morning.      . Probiotic Product (PROBIOTIC PO) Take 1 tablet by mouth daily.      Marland Kitchen triamcinolone cream (KENALOG) 0.1 % Apply 1 application topically at bedtime.       Marland Kitchen zolpidem (AMBIEN) 5 MG tablet Take 1 tablet (  5 mg total) by mouth at bedtime as needed for sleep.  90 tablet  1   Allergies  Allergen Reactions  . Morphine And Related Hives and Shortness Of Breath  . Statins Hives    Pt notes that "all" classes of cholesterol meds do the same   . Allopurinol Hives, Swelling and Rash    History  Substance Use Topics  . Smoking status: Former Research scientist (life sciences)  . Smokeless tobacco: Never Used     Comment: quit smoking 54yrs ago  . Alcohol Use: Yes     Comment: 4 oz drink daily    Family History  Problem Relation Age of Onset  . Diabetes Father   . Glaucoma Father   . Cancer Brother     1 died of lung cancer, 1 of colon, 1 of different cancer  . Glaucoma Daughter   . Glaucoma Son      Review of Systems  All other systems reviewed and are negative.   Objective:  Physical Exam  Vital signs in last 24 hours:    Labs:   Estimated body mass index is 32.00 kg/(m^2) as calculated from the following:   Height as of 07/25/13: 6\' 3"  (1.905 m).   Weight as of 07/25/13: 116.121 kg (256 lb).   Imaging Review Plain radiographs demonstrate moderate degenerative joint disease of the right knee(s). The overall alignment ismild varus. The bone quality appears to be adequate for age and reported activity level.  Assessment/Plan:  End stage arthritis, right knee   The patient history, physical examination, clinical judgment of the provider and imaging studies are consistent with end stage degenerative joint disease of the right knee(s) and total knee arthroplasty is deemed medically necessary. The treatment options including medical management, injection therapy arthroscopy and arthroplasty were discussed  at length. The risks and benefits of total knee arthroplasty were presented and reviewed. The risks due to aseptic loosening, infection, stiffness, patella tracking problems, thromboembolic complications and other imponderables were discussed. The patient acknowledged the explanation, agreed to proceed with the plan and consent was signed. Patient is being admitted for inpatient treatment for surgery, pain control, PT, OT, prophylactic antibiotics, VTE prophylaxis, progressive ambulation and ADL's and discharge planning. The patient is planning to be discharged home with home health services

## 2013-10-15 NOTE — Progress Notes (Signed)
Orthopedic Tech Progress Note Patient Details:  Zachary Vargas [birth year] [id number]  Ortho Devices Ortho Device/Splint Location: foot roll Ortho Device/Splint Interventions: Application Trapeze bar patient helper  Hildred Priest 10/15/2013, 9:30 PM

## 2013-10-15 NOTE — Progress Notes (Signed)
Orthopedic Tech Progress Note Patient Details:  Zachary Vargas December 18, 1927 [id number]  Ortho Devices Ortho Device/Splint Location: foot roll Ortho Device/Splint Interventions: Application   [name] Cammer 10/15/2013, 11:51 AM

## 2013-10-15 NOTE — Progress Notes (Signed)
Patient unable to spontaneously void following several attempts by 1700. Patient was bladder scanned at 1730 and 369mL was recorded. Patient was then in and out cathed at 1747 resulting 430mL of urine. Fluids were encouraged. Will continue to monitor output over the next 6 hours.

## 2013-10-15 NOTE — Progress Notes (Signed)
Orthopedic Tech Progress Note Patient Details:  Zachary Vargas 02-07-1928 [id number]  Patient ID: [name], male   DOB: 1927/10/08, 80 y.o.   MRN: [id number] Viewed order from [place] list  Hildred Priest 10/15/2013, 9:31 PM

## 2013-10-15 NOTE — Anesthesia Preprocedure Evaluation (Addendum)
Anesthesia Evaluation  Patient identified by MRN, date of birth, ID band Patient awake    Reviewed: Allergy & Precautions, H&P , NPO status   History of Anesthesia Complications Negative for: history of anesthetic complications  Airway Mallampati: III TM Distance: >3 FB Neck ROM: Full  Mouth opening: Limited Mouth Opening  Dental  (+) Edentulous Upper, Dental Advisory Given   Pulmonary former smoker,  breath sounds clear to auscultation        Cardiovascular hypertension, + Peripheral Vascular Disease Rhythm:Regular Rate:Normal     Neuro/Psych  Headaches, Anxiety Depression  Neuromuscular disease    GI/Hepatic PUD, GERD-  ,  Endo/Other  diabetes, Type 2Hypothyroidism   Renal/GU Renal InsufficiencyRenal disease     Musculoskeletal   Abdominal (+) + obese,   Peds  Hematology  (+) anemia ,   Anesthesia Other Findings   Reproductive/Obstetrics                        Anesthesia Physical Anesthesia Plan  ASA: III  Anesthesia Plan: General   Post-op Pain Management:    Induction: Intravenous  Airway Management Planned: Oral ETT  Additional Equipment:   Intra-op Plan:   Post-operative Plan: Extubation in OR  Informed Consent: I have reviewed the patients History and Physical, chart, labs and discussed the procedure including the risks, benefits and alternatives for the proposed anesthesia with the patient or authorized representative who has indicated his/her understanding and acceptance.   Dental advisory given  Plan Discussed with:   Anesthesia Plan Comments:         Anesthesia Quick Evaluation

## 2013-10-15 NOTE — Progress Notes (Signed)
Utilization review completed.  

## 2013-10-15 NOTE — Evaluation (Signed)
Physical Therapy Evaluation Patient Details Name: Zachary Vargas MRN: [id number] DOB: 17-Oct-1927 Today's Date: 10/15/2013   History of Present Illness  80 y.o. male admitted to [facility] on 10/15/13 for elective R TKA.  Pt with significant PMHx of DM, HTN, peripherial neuropathy, amputation left toes (multiple surgeries), and chronic back pain.  Per RN report, pt is an alcoholic.  He reports he feel the day before coming to the hosptial and has had at least 4 falls in the past month.  He has multiple bruises, skin tears and scrapes on bil legs and trunk.    Clinical Impression  Pt is limited by weakness.  He is unable even from elevated bed to even clear his buttocks with assist and RW.  He will require two person assist at next attempt to stand and get OOB.  He has h/o 4 falls in the past month, the last one was yesterday.  Pt is appropriate for SNF placement at d/c.   PT to follow acutely for deficits listed below.       Follow Up Recommendations SNF    Equipment Recommendations  Rolling walker with 5" wheels;3in1 (PT)    Recommendations for Other Services   NA    Precautions / Restrictions Precautions Precautions: Fall;Knee Precaution Comments: h/o multiple falls at home Restrictions RLE Weight Bearing: Weight bearing as tolerated      Mobility  Bed Mobility Overal bed mobility: Needs Assistance Bed Mobility: Supine to Sit;Sit to Supine     Supine to sit: Mod assist Sit to supine: +2 for physical assistance;Max assist   General bed mobility comments: Mod assist to support trunk to get to sitting EOB.  Pt managing bil legs on his own with extra time to maneuver right leg EOB.  Two person max assist to get situated back in the bed.  Support needed at legs and trunk.   Transfers Overall transfer level: Needs assistance Equipment used: Rolling walker (2 wheeled) Transfers: Sit to/from Stand Sit to Stand: Max assist         General transfer comment: Attempted to stand multiple  times from elevated bed.  Pt unable to clear his bottom from the bed with ouly one person assist and kept scooting closer to the edge almost to the point of scooting out into the floor.    Ambulation/Gait             General Gait Details: Unable tonight         Balance Overall balance assessment: Needs assistance Sitting-balance support: Bilateral upper extremity supported;Feet supported Sitting balance-Leahy Scale: Poor Sitting balance - Comments: min assist in sitting.  Pt reporting nausea and vomited.  Lightheaded in sitting as well.  Postural control: Posterior lean Standing balance support: Bilateral upper extremity supported Standing balance-Leahy Scale: Zero                               Pertinent Vitals/Pain See vitals flow sheet.     Home Living Family/patient expects to be discharged to:: Skilled nursing facility ([facility])                      Prior Function Level of Independence: Independent         Comments: independent, but significant h/o falls     Hand Dominance        Extremity/Trunk Assessment   Upper Extremity Assessment: Defer to OT evaluation  Lower Extremity Assessment: RLE deficits/detail;LLE deficits/detail RLE Deficits / Details: right leg with normal post-op weakness 3/5 ankle, 2-/5 knee, 2-/5 hip.  LLE Deficits / Details: left leg is not strong enough to support his weight in standing.  Has all of his toes amputated on this side.  strength 3+-4/5  Cervical / Trunk Assessment: Normal  Communication   Communication: HOH  Cognition Arousal/Alertness: Awake/alert Behavior During Therapy: WFL for tasks assessed/performed Overall Cognitive Status: No family/caregiver present to determine baseline cognitive functioning (not specifically tested)                               Assessment/Plan    PT Assessment Patient needs continued PT services  PT Diagnosis Difficulty  walking;Abnormality of gait;Generalized weakness;Acute pain   PT Problem List Decreased strength;Decreased activity tolerance;Decreased range of motion;Decreased balance;Decreased mobility;Decreased knowledge of use of DME;Decreased knowledge of precautions;Decreased safety awareness;Obesity;Pain  PT Treatment Interventions DME instruction;Gait training;Functional mobility training;Therapeutic activities;Therapeutic exercise;Balance training;Neuromuscular re-education;Patient/family education;Manual techniques;Modalities   PT Goals (Current goals can be found in the Care Plan section) Acute Rehab PT Goals Patient Stated Goal: to go to rehab before going home PT Goal Formulation: With patient Time For Goal Achievement: 10/22/13 Potential to Achieve Goals: Good    Frequency 7X/week   Barriers to discharge Decreased caregiver support pt lives alone and his wife left him last year       End of Session Equipment Utilized During Treatment: Gait belt Activity Tolerance: Patient limited by fatigue Patient left: in bed;with call bell/phone within reach;with nursing/sitter in room Nurse Communication: Mobility status         Time: 1701-1744 PT Time Calculation (min): 43 min   Charges:   PT Evaluation $Initial PT Evaluation Tier I: 1 Procedure PT Treatments $Therapeutic Activity: 23-37 mins        Melony Tenpas [address]. Lane, [place], DPT [phone]   10/15/2013, 5:57 PM

## 2013-10-15 NOTE — Plan of Care (Signed)
Problem: Consults Goal: Diagnosis- Total Joint Replacement Primary Total Knee Right     

## 2013-10-15 NOTE — Anesthesia Procedure Notes (Signed)
Anesthesia Regional Block:  Femoral nerve block  Pre-Anesthetic Checklist: ,, timeout performed, Correct Patient, Correct Site, Correct Laterality, Correct Procedure, Correct Position, site marked, Risks and benefits discussed, pre-op evaluation,  At surgeon's request and post-op pain management  Laterality: Right and Upper  Prep: chloraprep       Needles:  Injection technique: Single-shot  Needle Type: Echogenic Needle     Needle Length: 9cm 9 cm Needle Gauge: 22 and 22 G  Needle insertion depth: 6 cm   Additional Needles:  Procedures: ultrasound guided (picture in chart) and nerve stimulator Femoral nerve block  Nerve Stimulator or Paresthesia:  Response: Twitch elicited, 0.8 mA,   Additional Responses:   Narrative:  Start time: 10/15/2013 8:05 AM End time: 10/15/2013 8:20 AM Injection made incrementally with aspirations every 5 mL.  Performed by: Personally  Anesthesiologist: Bartolo Darter, MD  Additional Notes: BP cuff, EKG monitors applied. Sedation begun. Femoral artery palpated for location of nerve. After nerve location anesthetic injected incrementally, slowly , and after neg aspirations. Tolerated well.

## 2013-10-15 NOTE — Op Note (Signed)
OPERATIVE REPORT  DATE OF SURGERY: 10/15/2013  PATIENT:  Zachary Vargas,  80 y.o. male  PRE-OPERATIVE DIAGNOSIS:  Osteoarthritis right Knee  POST-OPERATIVE DIAGNOSIS:  Osteoarthritis right Knee  PROCEDURE:  Procedure(s): TOTAL KNEE ARTHROPLASTY Zimmer components. Size G tibia. Size 9 femur. 10 mm polyethylene tray. 32 mm patella.  SURGEON:  Surgeon(s): Newt Minion, MD  ANESTHESIA:   regional and general  EBL:  Minimal ML  SPECIMEN:  No Specimen  TOURNIQUET:   Total Tourniquet Time Documented: Thigh (Right) - 47 minutes Total: Thigh (Right) - 47 minutes   PROCEDURE DETAILS: Patient is an 80 year old gentleman with tricompartmental arthritis of the right knee he has failed conservative care and presents at this time for surgical intervention. Risks and benefits were discussed patient states he understands and wished to proceed at this time. Description of procedure patient was brought to the operating room and underwent a general anesthetic after femoral block. After adequate levels of anesthesia were obtained patient's right lower extremity was prepped using DuraPrep draped into a sterile field Ioban was used to cover all exposed skin. A timeout was called. Midline incision was made carried down to a medial parapatellar retinacular incision. Intramedullary guide was used for the femur set at 5 of valgus. 10 mm was taken off the distal femur. The tibia was then transected 10 mm was taken off the tibia with 7 posterior slope neutral varus and valgus. This sized for size G and the keel punch was made for size G. The femur was then sized for a size 9 and cuts were made for the femur. This was trialed and had full extension with 10 mm tray stable with flexion stable varus and valgus stress. The trial components removed the patella was resurfaced 10 mm taken off the patella and this was prepared for a 32 mm patella. The wound was irrigated with normal saline. The popliteal fossa was  injected with 60 cc Exparel care was made to not have an intravascular injection. The components were cemented in place polyethylene was placed these loose cement was removed the knee was left in extension until the cement hardened. The knee was then placed a full range of motion the patella tracked midline. The retinaculum was closed using #1 Vicryl subcutaneous is closed using 0 Vicryl. Skin was closed using staples. Sterile dressing was applied. Patient was extubated taken to the PACU in stable condition.  PLAN OF CARE: Admit to inpatient   PATIENT DISPOSITION:  PACU - hemodynamically stable.   Newt Minion, MD 10/15/2013 10:02 AM

## 2013-10-16 LAB — CBC
HCT: 22.8 % — ABNORMAL LOW (ref 39.0–52.0)
HEMOGLOBIN: 7.6 g/dL — AB (ref 13.0–17.0)
MCH: 33.5 pg (ref 26.0–34.0)
MCHC: 33.3 g/dL (ref 30.0–36.0)
MCV: 100.4 fL — ABNORMAL HIGH (ref 78.0–100.0)
Platelets: 124 10*3/uL — ABNORMAL LOW (ref 150–400)
RBC: 2.27 MIL/uL — ABNORMAL LOW (ref 4.22–5.81)
RDW: 14.1 % (ref 11.5–15.5)
WBC: 5.5 10*3/uL (ref 4.0–10.5)

## 2013-10-16 LAB — GLUCOSE, CAPILLARY
GLUCOSE-CAPILLARY: 121 mg/dL — AB (ref 70–99)
Glucose-Capillary: 128 mg/dL — ABNORMAL HIGH (ref 70–99)
Glucose-Capillary: 110 mg/dL — ABNORMAL HIGH (ref 70–99)
Glucose-Capillary: 124 mg/dL — ABNORMAL HIGH (ref 70–99)

## 2013-10-16 LAB — BASIC METABOLIC PANEL
BUN: 54 mg/dL — ABNORMAL HIGH (ref 6–23)
CO2: 20 mEq/L (ref 19–32)
Calcium: 8.3 mg/dL — ABNORMAL LOW (ref 8.4–10.5)
Chloride: 101 mEq/L (ref 96–112)
Creatinine, Ser: 2.69 mg/dL — ABNORMAL HIGH (ref 0.50–1.35)
GFR, EST AFRICAN AMERICAN: 24 mL/min — AB (ref >90)
GFR, EST NON AFRICAN AMERICAN: 20 mL/min — AB (ref >90)
Glucose, Bld: 137 mg/dL — ABNORMAL HIGH (ref 70–99)
POTASSIUM: 4.9 meq/L (ref 3.7–5.3)
SODIUM: 135 meq/L — AB (ref 137–147)

## 2013-10-16 MED ORDER — LORAZEPAM 1 MG PO TABS: 1.0000 mg | ORAL_TABLET | Freq: Four times a day (QID) | ORAL | Status: DC | PRN

## 2013-10-16 NOTE — Clinical Social Work Placement (Addendum)
Clinical Social Work Department  CLINICAL SOCIAL WORK PLACEMENT NOTE  Patient: Zachary Vargas Account [id number] Admit date: 10/15/13  Clinical Social Worker: Rhea Pink LCSWA Date/time: 10/16/2013 4:51 PM  Clinical Social Work is seeking post-discharge placement for this patient at the following level of care: SKILLED NURSING (*CSW will update this form in Epic as items are completed)  5/7/2015Patient/family provided with [facility] Department of Clinical Social Work's list of facilities offering this level of care within the geographic area requested by the patient (or if unable, by the patient's family).  10/16/2013 Patient/family informed of their freedom to choose among providers that offer the needed level of care, that participate in Medicare, Medicaid or managed care program needed by the patient, have an available bed and are willing to accept the patient.  10/16/2013 Patient/family informed of [facility]' ownership interest in [facility], as well as of the fact that they are under no obligation to receive care at this facility.  PASARR submitted to EDS on 10/16/2013 PASARR number received from EDS on 10/16/2013  FL2 transmitted to all facilities in geographic area requested by pt/family on 10/16/2013  FL2 transmitted to all facilities within larger geographic area on  Patient informed that his/her managed care company has contracts with or will negotiate with certain facilities, including the following:  Patient/family informed of bed offers received:  10/19/13 Patient chooses bed at [facility] Physician recommends and patient chooses bed at  Patient to be transferred to [facility] on  10/19/13 Patient to be transferred to facility by Ambulance  [name]) The following physician request were entered in Epic:  Additional Comments:    10/19/13  [place] per MD for d/c today to [facility]. CCSW notified patient and his son Zachary Vargas and they were pleased with d/c plan to go to [facility]. CSW report for the weekend stated that d/c plan was for [facility].  Nursing notified to call report. No further CSW needs identified. CSW signing off.  [name]. [place], [place]

## 2013-10-16 NOTE — Clinical Social Work Psychosocial (Signed)
Clinical Social Work Department  BRIEF PSYCHOSOCIAL ASSESSMENT  Patient: Zachary Vargas  Account Number: [id number]   Admit date: 10/15/13 Clinical Social Worker Rhea Pink, MSW Date/Time: 10/16/2013 4:53 PM Referred by: Physician Date Referred:  Referred for   SNF Placement   Other Referral:  Interview type: Patient at bedside  Other interview type: PSYCHOSOCIAL DATA  Living Status: family Admitted from facility:  Level of care:  Primary support name:  [place]  Primary support relationship to patient:  Spouse Degree of support available:  Strong and vested  CURRENT CONCERNS  Current Concerns   Post-Acute Placement   Other Concerns:  SOCIAL WORK ASSESSMENT / PLAN  CSW met with pt at bedside to offer support and to discuss placement. Patient reports that he is agreeable to SNF placement and reported that he needs it prior to returning home. Patient expressed no anxiety or hesitations about SNF placement. Patient Thanked CSW for assistance and support. re: PT recommendation for SNF.   Pt lives with family  CSW explained placement process and answered questions.   Pt reports no preference at this time  CSW completed FL2 and initiated SNF search.     Assessment/plan status: Information/Referral to [employer]  Other assessment/ plan:  Information/referral to community resources:  SNF   PTAR  PATIENT'S/FAMILY'S RESPONSE TO PLAN OF CARE:  Pt  reports se is agreeable to ST SNF in order to increase strength and independence with mobility prior to returning home  Pt verbalized understanding of placement process and appreciation for CSW assist.   Rhea Pink, MSW, [place]

## 2013-10-16 NOTE — Progress Notes (Signed)
10/16/13 PT recommended SNF. Referral made to CSW. CM will continue to follow until discharge. Mariel Craft RN, BSN, CCM

## 2013-10-16 NOTE — Progress Notes (Signed)
Patient with intermittent confusion tonight.  Reorients easily.  Bed alarm on for safety.  Unable to tolerate/refuses footsie roll block to right foot.  Encouraged to keep leg straight and educated to knee precautions.    Patient remains unable to void since last I/O cath at 1745 yesterday.  In and out done at 0430 with only 250cc obtained.  No complaints of bladder pain or discomfort.  Patient has been drinking plenty of water this shift.  Continue to monitor.

## 2013-10-16 NOTE — Progress Notes (Signed)
Call placed to Dr Sharol Given. Regarding iv access and possible need for access through rest of admission,.Marland Kitchen

## 2013-10-16 NOTE — Progress Notes (Signed)
Patient having sm episodes of incontinence, urinal encouraged.

## 2013-10-16 NOTE — Progress Notes (Signed)
Physical Therapy Treatment Patient Details Name: Zachary Vargas MRN: [id number] DOB: Jun 13, 1927 Today's Date: 10/16/2013    History of Present Illness 80 y.o. male admitted to [facility] on 10/15/13 for elective R TKA.  Pt with significant PMHx of DM, HTN, peripherial neuropathy, amputation left toes (multiple surgeries), and chronic back pain.  Per RN report, pt is an alcoholic.  He reports he feel the day before coming to the hosptial and has had at least 4 falls in the past month.  He has multiple bruises, skin tears and scrapes on bil legs and trunk.      PT Comments    Patient unsafe with mobility at this time. Patient appears confused. Can state he had surgery but doesn't regard in safety with mobility. Appears shaky and unsafe. Rn aware. Patient returned to bed for safe measures. Continue to recommend SNF for ongoing Physical Therapy.     Follow Up Recommendations  SNF     Equipment Recommendations  Rolling walker with 5" wheels;3in1 (PT)    Recommendations for Other Services       Precautions / Restrictions Precautions Precautions: Fall;Knee Precaution Comments: h/o multiple falls at home Restrictions Weight Bearing Restrictions: Yes RLE Weight Bearing: Weight bearing as tolerated    Mobility  Bed Mobility Overal bed mobility: Needs Assistance       Supine to sit: Mod assist     General bed mobility comments: Mod A for trunk control and to use pad to scoot hips to EOB. Patient impulsive and trying to get up before instructed.   Transfers Overall transfer level: Needs assistance Equipment used: Rolling walker (2 wheeled)   Sit to Stand: +2 physical assistance;Total assist         General transfer comment: Attempted stand x3 with +2 total assist and unable to achieve full upright stand. Patient resisting movement and stated that its too much. Patient appears confused with instructions and unsafe trying to move forward. Required +2 assist to scoot patient back onto bed  to prevent him sliding out  Ambulation/Gait             General Gait Details: unsafe at this time   Stairs            Wheelchair Mobility    Modified Rankin (Stroke Patients Only)       Balance                                    Cognition Arousal/Alertness: Awake/alert Behavior During Therapy: Anxious;Impulsive Overall Cognitive Status: No family/caregiver present to determine baseline cognitive functioning       Memory: Decreased short-term memory;Decreased recall of precautions              Exercises Total Joint Exercises Heel Slides: PROM;Right;10 reps Hip ABduction/ADduction: PROM;Right;10 reps Straight Leg Raises: PROM;Right;10 reps    General Comments        Pertinent Vitals/Pain Stated pain but did not rate    Home Living                      Prior Function            PT Goals (current goals can now be found in the care plan section) Progress towards PT goals: Progressing toward goals    Frequency  7X/week    PT Plan Current plan remains appropriate    Co-evaluation  End of Session Equipment Utilized During Treatment: Gait belt Activity Tolerance: Patient limited by fatigue Patient left: in bed;with call bell/phone within reach     Time: 1007-1032 PT Time Calculation (min): 25 min  Charges:  $Therapeutic Exercise: 8-22 mins $Therapeutic Activity: 8-22 mins                    G Codes:      Tonia Brooms Olevia Westervelt 10/16/2013, 10:56 AM 10/16/2013 [place] PTA [phone] pager [phone] office

## 2013-10-16 NOTE — Progress Notes (Signed)
OT Cancellation Note  Patient Details Name: Zachary Vargas MRN: [id number] DOB: Dec 13, 1927   Cancelled Treatment:    Reason Eval/Treat Not Completed: Other (comment) Pt is Medicare and current D/C plan is SNF per pt chart. No apparent immediate acute care OT needs, therefore will defer OT to SNF. If OT eval is needed please call Acute Rehab Dept. at [phone] or text page OT at [phone].   Juluis Rainier [phone] 10/16/2013, 8:19 AM

## 2013-10-16 NOTE — Clinical Documentation Improvement (Signed)
Possible Clinical Conditions?  CKD Stage IV - GFR 15-29 Other condition Supporting Information: Risk Factors: pmh: CKD Diagnostics: 10/09/2013 (29); 10/16/2013 (20);  Treatment: serial monitoring Thank You, Joya Salm ,RN Clinical Documentation Specialist:  [place] Information Management

## 2013-10-16 NOTE — Progress Notes (Signed)
Patient very disoriented and believes he is working at [employer].setting off bed alarm frequently, attempting to climb over rails. Patient has pulled out iv and pulled over iv pump. Side rails up and alarm on. Multiple attempts to reorient patient and enlighten with safety concerns unwarrented.

## 2013-10-16 NOTE — Clinical Documentation Improvement (Signed)
Possible Clinical Conditions?  Expected Acute Blood Loss Anemia Acute Blood Loss Anemia Acute on chronic blood loss anemia Precipitous drop in Hematocrit Other Condition  Risk Factors: recent surgery, pre op anemia, EBL in OR Diagnostics: H&H on admit: 5/6: H/H: 10.2/31.0;  Post OP H&H: 5/7: H/H: 22.8/7.6 Treatments: Serial H&H monitoring Medications: Ferrous gluconate 324 tab q breakfast Thank You, Joya Salm ,RN Clinical Documentation Specialist:  [phone] [place] Information Management

## 2013-10-16 NOTE — Progress Notes (Signed)
Dr Sharol Given returns phone call, ok to leave out iv at this time. PRN ativan ordered for possible resource later per MD.

## 2013-10-16 NOTE — Progress Notes (Signed)
Patient ID: Zachary Vargas, male   DOB: [birth year], 80 y.o.   MRN: [id number] Postoperative day 1 status post right total knee arthroplasty. Patient is comfortable this morning. Will have the dressing changed to a Mepilex dressing.  Patient and family agree for discharge to skilled nursing facility.

## 2013-10-16 NOTE — Clinical Documentation Improvement (Signed)
Possible Clinical Conditions?  Acute Renal Failure/Acute Kidney Injury Acute Tubular Necrosis Other Condition Risk Factors: ckd; surgery Diagnostics: 10/09/13 bun/creat: 61/2.01; 10/16/2013 bun/creat: 54/2.69 Treatments:Thank You, Joya Salm ,RN Clinical Documentation Specialist:  [place] Information Management

## 2013-10-17 ENCOUNTER — Encounter (HOSPITAL_COMMUNITY): Payer: Self-pay | Admitting: Orthopedic Surgery

## 2013-10-17 LAB — CBC
HCT: 20.7 % — ABNORMAL LOW (ref 39.0–52.0)
Hemoglobin: 6.8 g/dL — CL (ref 13.0–17.0)
MCH: 32.9 pg (ref 26.0–34.0)
MCHC: 32.9 g/dL (ref 30.0–36.0)
MCV: 100 fL (ref 78.0–100.0)
PLATELETS: 120 10*3/uL — AB (ref 150–400)
RBC: 2.07 MIL/uL — ABNORMAL LOW (ref 4.22–5.81)
RDW: 14 % (ref 11.5–15.5)
WBC: 5.8 10*3/uL (ref 4.0–10.5)

## 2013-10-17 LAB — GLUCOSE, CAPILLARY
GLUCOSE-CAPILLARY: 167 mg/dL — AB (ref 70–99)
Glucose-Capillary: 116 mg/dL — ABNORMAL HIGH (ref 70–99)
Glucose-Capillary: 133 mg/dL — ABNORMAL HIGH (ref 70–99)
Glucose-Capillary: 129 mg/dL — ABNORMAL HIGH (ref 70–99)

## 2013-10-17 LAB — PREPARE RBC (CROSSMATCH)

## 2013-10-17 MED ORDER — ASPIRIN EC 81 MG PO TBEC
81.0000 mg | DELAYED_RELEASE_TABLET | Freq: Every day | ORAL | Status: DC
Start: 2013-10-17 — End: 2014-01-06

## 2013-10-17 MED ORDER — TRAMADOL HCL 50 MG PO TABS: 50.0000 mg | ORAL_TABLET | Freq: Four times a day (QID) | ORAL | Status: DC | PRN

## 2013-10-17 MED ORDER — DOCUSATE SODIUM 100 MG PO CAPS
100.0000 mg | ORAL_CAPSULE | Freq: Two times a day (BID) | ORAL | Status: DC
  Administered 2013-10-17 – 2013-10-19 (×4): 100 mg via ORAL
  Filled 2013-10-17 (×4): qty 1

## 2013-10-17 MED ORDER — BISACODYL 10 MG RE SUPP: 10.0000 mg | Freq: Every day | RECTAL | Status: DC | PRN

## 2013-10-17 MED ORDER — POLYETHYLENE GLYCOL 3350 17 G PO PACK
17.0000 g | PACK | Freq: Two times a day (BID) | ORAL | Status: DC
  Administered 2013-10-17 – 2013-10-18 (×3): 17 g via ORAL
  Filled 2013-10-17 (×5): qty 1

## 2013-10-17 MED ORDER — ACETAMINOPHEN 500 MG PO TABS: 500.0000 mg | ORAL_TABLET | Freq: Four times a day (QID) | ORAL | Status: DC | PRN

## 2013-10-17 NOTE — Plan of Care (Signed)
Problem: Phase II Progression Outcomes Goal: Ambulates Outcome: Not Met (add Reason) Patient not progressing well with therapies.  See notes.  Plan for SNF.

## 2013-10-17 NOTE — Discharge Summary (Addendum)
Physician Discharge Summary  Patient ID: Zachary Vargas MRN: [id number] DOB/AGE: [birth year] 80 y.o.  Admit date: 10/15/2013 Discharge date: 10/19/2013  Admission Diagnoses: Osteoarthritis right knee  Discharge Diagnoses: Osteoarthritis right knee Active Problems:   Total knee replacement status   Discharged Condition: stable  Hospital Course: Patient's hospital course was essentially unremarkable. He did have acute blood loss anemia. He had chronic renal failure. Patient did have venous stasis changes in the compressive wraps were applied to both lower extremities. He underwent total knee arthroplasty and was discharged to skilled nursing.  Consults: None  Significant Diagnostic Studies: labs: Routine labs  Treatments: surgery: See operative note  Discharge Exam: Blood pressure 99/47, pulse 86, temperature 98.3 F (36.8 C), temperature source Oral, resp. rate 19, weight 115.667 kg (255 lb), SpO2 96.00%. Incision/Wound: dressing clean dry and intact  Disposition: 01-Home or Self Care      Discharge Orders   Future Orders Complete By Expires   Call MD / Call 911  As directed    Call MD / Call 911  As directed    Constipation Prevention  As directed    Constipation Prevention  As directed    Diet - low sodium heart healthy  As directed    Diet - low sodium heart healthy  As directed    Do not put a pillow under the knee. Place it under the heel.  As directed    Driving restrictions  As directed    Increase activity slowly as tolerated  As directed    Increase activity slowly as tolerated  As directed    TED hose  As directed    Weight bearing as tolerated  As directed    Questions:     Laterality:     Extremity:         Medication List         acetaminophen 500 MG tablet  Commonly known as:  TYLENOL  Take 1 tablet (500 mg total) by mouth every 6 (six) hours as needed for mild pain.     aspirin EC 81 MG tablet  Take 1 tablet (81 mg total) by mouth daily.      cholecalciferol 1000 UNITS tablet  Commonly known as:  VITAMIN D  Take 1,000 Units by mouth daily.     COLCRYS 0.6 MG tablet  Generic drug:  colchicine  Take 0.6 mg by mouth daily.     dorzolamide-timolol 22.3-6.8 MG/ML ophthalmic solution  Commonly known as:  COSOPT  Place 1 drop into both eyes 2 (two) times daily.     febuxostat 40 MG tablet  Commonly known as:  ULORIC  Take 40 mg by mouth daily.     ferrous gluconate 325 MG tablet  Commonly known as:  FERGON  Take 325 mg by mouth daily with breakfast.     Fish Oil 1000 MG Caps  Take 1,000 mg by mouth daily.     furosemide 40 MG tablet  Commonly known as:  LASIX  Take 1 tablet (40 mg total) by mouth daily.     glipiZIDE 10 MG tablet  Commonly known as:  GLUCOTROL  Take 5 mg by mouth daily before breakfast.     HYDROcodone-acetaminophen 5-325 MG per tablet  Commonly known as:  NORCO/VICODIN  Take 1 tablet by mouth every 6 (six) hours as needed for moderate pain.     hydrOXYzine 25 MG tablet  Commonly known as:  ATARAX/VISTARIL  Take 25 mg by mouth 3 (three) times  daily.     latanoprost 0.005 % ophthalmic solution  Commonly known as:  XALATAN  Place 1 drop into the right eye at bedtime.     levothyroxine 50 MCG tablet  Commonly known as:  SYNTHROID, LEVOTHROID  Take 50 mcg by mouth daily before breakfast.     lisinopril 10 MG tablet  Commonly known as:  PRINIVIL,ZESTRIL  Take 1 tablet (10 mg total) by mouth daily.     multivitamin with minerals tablet  Take 1 tablet by mouth daily.     nortriptyline 50 MG capsule  Commonly known as:  PAMELOR  Take 1 capsule (50 mg total) by mouth at bedtime.     pantoprazole 20 MG tablet  Commonly known as:  PROTONIX  Take 1 tablet (20 mg total) by mouth daily.     PARoxetine 40 MG tablet  Commonly known as:  PAXIL  Take 40 mg by mouth every morning.     PROBIOTIC PO  Take 1 tablet by mouth daily.     triamcinolone cream 0.1 %  Commonly known as:  KENALOG  Apply  1 application topically at bedtime.     zolpidem 5 MG tablet  Commonly known as:  AMBIEN  Take 1 tablet (5 mg total) by mouth at bedtime as needed for sleep.       Follow-up Information   Follow up with DUDA,MARCUS V, MD In 2 weeks.   Specialty:  Orthopedic Surgery   Contact information:   [place] [address] [phone]       Signed: Marianna Payment 10/19/2013, 9:44 AM

## 2013-10-17 NOTE — Progress Notes (Signed)
PT Cancellation Note  Patient Details Name: Zachary Vargas MRN: [id number] DOB: December 21, 1927   Cancelled Treatment:    Reason Eval/Treat Not Completed: Medical issues which prohibited therapy. Patients hgb 6.8 this AM. With other medical issues going on and lack of progressing, patient not appropriate for PT today. Will follow up tomorrow to see if patient is appropriate   Tonia Brooms [name] 10/17/2013, 7:20 AM

## 2013-10-17 NOTE — Progress Notes (Signed)
Orthopedic Tech Progress Note Patient Details:  Zachary Vargas [birth year] [id number]  Ortho Devices Type of Ortho Device: Haematologist Ortho Device/Splint Location: bilateral unna boots Ortho Device/Splint Interventions: Application  Bilateral unna boots applied to lower extremities. Patient will contact nursing staff with any questions or concerns.  Ashok Cordia 10/17/2013, 11:10 AM

## 2013-10-17 NOTE — Progress Notes (Signed)
Patient ID: Zachary Vargas, male   DOB: 02/13/1928, 80 y.o.   MRN: [id number] Patient is alert and oriented this morning. Will discontinue narcotic pain medication and order written for Ultram. Hemoglobin dropped postoperatively with acute blood loss anemia blood work is pending from this morning. Patient may require transfusion. Patient has chronic renal disease and did have decreased renal function postoperatively which is consistent with his chronic renal disease with renal insufficiency. F. L2 is completed. Patient will require discharge to skilled nursing. Discharge orders completed. Patient with chronic venous stasis insufficiency and will have both legs wrapped with an Unna compression wraps.

## 2013-10-17 NOTE — Progress Notes (Signed)
Patient received 3 units of insulin this am in error.  CBG was 116.  Patient alert and eating breakfast and drinking orange juice.  Will recheck CBG and monitor for hypoglycemia.  Dr. Sharol Given notified of medication error.     [employer] notified of new order for Unna boots to legs.    Dr. Sharol Given notified of Hgb 6.8 and gave order to transfuse 1 unit PRBC.

## 2013-10-17 NOTE — Progress Notes (Signed)
CRITICAL VALUE ALERT  Critical value received:  Hemoglobin 6.8  Date of notification:  10/17/13  Time of notification:  0717  Critical value read back:yes  Nurse who received alert:  Rosalie Gums, RN  MD notified (1st page):  Dr. Sharol Given  Time of first page:  0745  MD notified (2nd page):  Time of second page:  Responding MD:  Dr. Sharol Given  Time MD responded:  71   New order for transfuse 1 unit PRBC

## 2013-10-17 NOTE — Progress Notes (Signed)
Dressing to right leg changed per MD order.  Old dressing with large amount old dried blood.  Staples intact, incision well approximated.  Scant new bleeding at incision noted when old dressing removed.  Mepilex applied.  Right leg with large amount of bruising.  Also redness and warmth noted to right lower leg.  Generalized edema.    Patient has very fragile, thin skin with bruises to all extremities.  He reports a history of taking steroids (current home med list does not list steroid).  When old dressing to right leg removed, pt had 2 small bleeding skin tears to thigh and one to posterior knee.  Small pink foam dressings applied to these areas.  Patient also has large skin tear to left forearm that was present on admission and had a duoderm dressing from home in place.  Duoderm removed and skin tear cleansed with NS.  Large piece of loose dark skin present.  Wound bed pink.  Pink foam 5X5 dressed to area.  Sacrum also presented on admission with blanchable reddened area.  Foam dressing placed yesterday changed d/t soiled with urine.  Condom cath placed to control incontinence and difficulty using urinal.  Condom cath working well.  Patient also has abrasion/skin laceration to abdominal folds/crease.  Pink foam applied to left abdominal fold yesterday remains intact.

## 2013-10-18 LAB — TYPE AND SCREEN
ABO/RH(D): O POS
ANTIBODY SCREEN: NEGATIVE
Unit division: 0

## 2013-10-18 LAB — GLUCOSE, CAPILLARY
GLUCOSE-CAPILLARY: 115 mg/dL — AB (ref 70–99)
GLUCOSE-CAPILLARY: 76 mg/dL (ref 70–99)
GLUCOSE-CAPILLARY: 49 mg/dL — AB (ref 70–99)
GLUCOSE-CAPILLARY: 109 mg/dL — AB (ref 70–99)
GLUCOSE-CAPILLARY: 91 mg/dL (ref 70–99)
Glucose-Capillary: 103 mg/dL — ABNORMAL HIGH (ref 70–99)
Glucose-Capillary: 116 mg/dL — ABNORMAL HIGH (ref 70–99)
Glucose-Capillary: 44 mg/dL — CL (ref 70–99)

## 2013-10-18 LAB — CBC
HCT: 21.2 % — ABNORMAL LOW (ref 39.0–52.0)
Hemoglobin: 7.1 g/dL — ABNORMAL LOW (ref 13.0–17.0)
MCH: 32.6 pg (ref 26.0–34.0)
MCHC: 33.5 g/dL (ref 30.0–36.0)
MCV: 97.2 fL (ref 78.0–100.0)
Platelets: 127 10*3/uL — ABNORMAL LOW (ref 150–400)
RBC: 2.18 MIL/uL — ABNORMAL LOW (ref 4.22–5.81)
RDW: 15.1 % (ref 11.5–15.5)
WBC: 5 10*3/uL (ref 4.0–10.5)

## 2013-10-18 MED ORDER — COLCHICINE 0.6 MG PO TABS
0.3000 mg | ORAL_TABLET | Freq: Every day | ORAL | Status: DC
  Administered 2013-10-19: 0.3 mg via ORAL
  Filled 2013-10-18: qty 0.5

## 2013-10-18 NOTE — Progress Notes (Signed)
CSW (Clinical Education officer, museum) called pt nurse to inquire if plan was still for dc today. Per pt nurse, pt not medically stable to dc today. CSW will continue to follow and assist with discharge when appropriate.  [place], [place]

## 2013-10-18 NOTE — Progress Notes (Signed)
Physical Therapy Treatment Patient Details Name: Zachary Vargas MRN: [id number] DOB: 1928-02-01 Today's Date: 10/18/2013    History of Present Illness 80 y.o. male admitted to [facility] on 10/15/13 for elective R TKA.  Pt with significant PMHx of DM, HTN, peripherial neuropathy, amputation left toes (multiple surgeries), and chronic back pain.  He reports he feel the day before coming to the hosptial and has had at least 4 falls in the past month.  He has multiple bruises, skin tears and scrapes on bil legs and trunk.      PT Comments    Pt currently requiring +2 assist for all mobility & not progressing with mobility at this date due to weakness (Hgb 7.1), pain, & decreased BP with activity.  Spoke with RN prior to session due to pt's low Hgb & she ok'd to proceed with therapy.  Upon arrival Pt stating he needed to use bathroom so assisted pt to [facility].  BP dropping with activity & pt reporting dizziness.  Pt unable to take pivotal steps therefore bed had to be moved out of way & BSC positioned directly behind pt, same thing required for BSC>recliner.  Once in recliner pt's BP 77/35 therefore pt was assisted back into bed for safety.  RN was present & aware of pt's BP.  Cont to strongly recommend SNF at d/c to maximize pt's functional mobility.    Follow Up Recommendations  SNF     Equipment Recommendations  Rolling walker with 5" wheels;3in1 (PT)    Recommendations for Other Services       Precautions / Restrictions Precautions Precautions: Fall;Knee Precaution Comments: h/o multiple falls at home Restrictions RLE Weight Bearing: Weight bearing as tolerated    Mobility  Bed Mobility Overal bed mobility: +2 for physical assistance Bed Mobility: Supine to Sit;Sit to Supine     Supine to sit: Mod assist;HOB elevated Sit to supine: +2 for physical assistance;Max assist   General bed mobility comments: cues for technique.  (A) for LE management, to elevate shoulders/trunk to sitting upright  as well as to transition back into supine.    Transfers Overall transfer level: Needs assistance Equipment used: Rolling walker (2 wheeled) Transfers: Sit to/from [facility] Sit to Stand: +2 physical assistance;Total assist Stand pivot transfers: +2 physical assistance       General transfer comment: Performed bed>3-in-1 with pt unable to take pivotal steps around therefore had to move bed out of way & position 3-in-1 directly behind pt.  Pt unable to achieve full hip extension.  Pt's BP dropping with activity so pt was assisted back to bed using the stedy.    Ambulation/Gait                 Stairs            Wheelchair Mobility    Modified Rankin (Stroke Patients Only)       Balance                                    Cognition Arousal/Alertness: Awake/alert Behavior During Therapy: WFL for tasks assessed/performed Overall Cognitive Status: No family/caregiver present to determine baseline cognitive functioning                      Exercises      General Comments        Pertinent Vitals/Pain C/o Rt knee pain but did not  rate.  States "it does feel better than yesterday".   BP:  Supine: 94/46         Sitting EOB: 87/37         After transfer from bed>BSC:  85/61         Sitting in recliner:  77/35    Home Living                      Prior Function            PT Goals (current goals can now be found in the care plan section) Acute Rehab PT Goals PT Goal Formulation: With patient Time For Goal Achievement: 10/22/13 Potential to Achieve Goals: Good Progress towards PT goals: Not progressing toward goals - comment    Frequency  7X/week    PT Plan Current plan remains appropriate    Co-evaluation             End of Session Equipment Utilized During Treatment: Gait belt Activity Tolerance: Patient limited by fatigue Patient left: in bed;with bed alarm set;with call bell/phone within  reach     Time: 0816-0902 PT Time Calculation (min): 46 min  Charges:  $Therapeutic Activity: 38-52 mins                    G CodesSarajane Marek, [place] [phone] 10/18/2013

## 2013-10-18 NOTE — Progress Notes (Signed)
Patient attempted bowel movement on bedpan, passed gas and had scant amount of stool.  Patient requested bedside commode to help evacuate bowels.  Attempted up to bedside commode with 2+ assist but unsuccessful due to weakness.  Patient required moderate 2+ assist to sitting, then stood with max assist but had to sit back down due to weakness.  Patient denied dizziness but stated he felt different.  Assisted back to bed.

## 2013-10-18 NOTE — Progress Notes (Signed)
Patient remains disoriented to place, time and situation.  Reorients easily, saying "I'm so confused."  But, then forgets again.  Speech sometimes with difficulty getting words out, reporting "dry mouth."  Patient drinking plenty of water.  Fidgets with call bell and telephone and bed controls.  Patient calm and cooperative, follows commands.  Patient denies any pain or discomfort.  Condom cath in place for incontinence/urinal difficult to use; draining clear, yellow urine. Bed alarm on for patient safety.

## 2013-10-18 NOTE — Progress Notes (Signed)
Patient stable with bilateral UNNA boot dynaflex wraps. Will plan on dc to SNF tomorrow  N. Eduard Roux, MD [place] 10:41 AM

## 2013-10-19 LAB — GLUCOSE, CAPILLARY
GLUCOSE-CAPILLARY: 100 mg/dL — AB (ref 70–99)
Glucose-Capillary: 79 mg/dL (ref 70–99)
Glucose-Capillary: 94 mg/dL (ref 70–99)

## 2013-10-19 NOTE — Progress Notes (Signed)
Physical Therapy Treatment Patient Details Name: Zachary Vargas MRN: [id number] DOB: [birth year] Today's Date: 10/19/2013    History of Present Illness 80 y.o. male admitted to [facility] on 10/15/13 for elective R TKA.  Pt with significant PMHx of DM, HTN, peripherial neuropathy, amputation left toes (multiple surgeries), and chronic back pain.  Per RN report, pt is an alcoholic.  He reports he feel the day before coming to the hosptial and has had at least 4 falls in the past month.  He has multiple bruises, skin tears and scrapes on bil legs and trunk.      PT Comments    Patient feeling some better today, agreeable to therapy.  R LE good ROM, 0-85 visually, pain in R LE and 'woozy' with prolonged standing.  +2 assist transfer to bedside chair with BP monitored.  Patient reports possible move to [facility] today.  Follow Up Recommendations  SNF     Equipment Recommendations  Rolling walker with 5" wheels;3in1 (PT)    Recommendations for Other Services       Precautions / Restrictions Precautions Precautions: Fall;Knee Precaution Comments: h/o multiple falls at home Restrictions Weight Bearing Restrictions: Yes RLE Weight Bearing: Weight bearing as tolerated    Mobility  Bed Mobility Overal bed mobility: Needs Assistance Bed Mobility: Supine to Sit;Sit to Supine     Supine to sit: Mod assist;HOB elevated        Transfers Overall transfer level: Needs assistance Equipment used: Rolling walker (2 wheeled) Transfers: Sit to/from [facility] Sit to Stand: +2 physical assistance;Mod assist Stand pivot transfers: +2 physical assistance;Mod assist       General transfer comment: Low tolerance for activity, Woozy with extended standing, BP 105/49   Ambulation/Gait             General Gait Details: Transfers only at this time   Financial trader Rankin (Stroke Patients Only)       Balance Overall balance  assessment: Needs assistance Sitting-balance support: Bilateral upper extremity supported;Feet supported Sitting balance-Leahy Scale: Poor Sitting balance - Comments: Min guard for sitting at EOB   Standing balance support: Bilateral upper extremity supported Standing balance-Leahy Scale: Poor Standing balance comment: R knee tends to flex                    Cognition Arousal/Alertness: Awake/alert Behavior During Therapy: WFL for tasks assessed/performed Overall Cognitive Status: No family/caregiver present to determine baseline cognitive functioning                      Exercises Total Joint Exercises Ankle Circles/Pumps: AROM;Both;10 reps Quad Sets: AROM;Both;10 reps Gluteal Sets: AROM;Both;10 reps Heel Slides: AAROM;10 reps;Right Hip ABduction/ADduction: AAROM;Right;10 reps Goniometric ROM: 0-85 visually    General Comments        Pertinent Vitals/Pain BP 105/49 in sitting before and after activity.  Pain reported to increase to moderate/severe levels with prolonged standing and activity, mild pain at rest.    Home Living                      Prior Function            PT Goals (current goals can now be found in the care plan section) Acute Rehab PT Goals Patient Stated Goal: to go to rehab before going home PT Goal Formulation: With patient Time For Goal  Achievement: 10/22/13 Potential to Achieve Goals: Good Progress towards PT goals: Progressing toward goals    Frequency  7X/week    PT Plan Current plan remains appropriate    Co-evaluation             End of Session Equipment Utilized During Treatment: Gait belt Activity Tolerance: Patient limited by fatigue Patient left: in chair;with call bell/phone within reach;with chair alarm set     Time: 4481-8563 PT Time Calculation (min): 35 min  Charges:  $Therapeutic Exercise: 8-22 mins $Therapeutic Activity: 8-22 mins                    G Codes:      Kroy Sprung L  Maymuna Detzel 11/17/13, 2:04 PM

## 2013-10-19 NOTE — Progress Notes (Signed)
We got patient up several times to the [facility] to have BM.  He had BM around 0615..  It required 3 person assist along with gait belt.  His legs are very weak and he refused any pain medications.

## 2013-10-20 ENCOUNTER — Encounter: Payer: Self-pay | Admitting: Adult Health

## 2013-10-21 ENCOUNTER — Encounter: Payer: Self-pay | Admitting: *Deleted

## 2013-10-21 ENCOUNTER — Non-Acute Institutional Stay (SKILLED_NURSING_FACILITY): Payer: Medicare Other | Admitting: Internal Medicine

## 2013-10-21 DIAGNOSIS — E119 Type 2 diabetes mellitus without complications: Secondary | ICD-10-CM

## 2013-10-21 DIAGNOSIS — M1710 Unilateral primary osteoarthritis, unspecified knee: Secondary | ICD-10-CM

## 2013-10-21 DIAGNOSIS — IMO0002 Reserved for concepts with insufficient information to code with codable children: Principal | ICD-10-CM

## 2013-10-21 DIAGNOSIS — E039 Hypothyroidism, unspecified: Secondary | ICD-10-CM

## 2013-10-21 DIAGNOSIS — D62 Acute posthemorrhagic anemia: Secondary | ICD-10-CM

## 2013-10-24 DIAGNOSIS — D62 Acute posthemorrhagic anemia: Secondary | ICD-10-CM | POA: Insufficient documentation

## 2013-10-24 DIAGNOSIS — M1710 Unilateral primary osteoarthritis, unspecified knee: Secondary | ICD-10-CM | POA: Insufficient documentation

## 2013-10-24 DIAGNOSIS — IMO0002 Reserved for concepts with insufficient information to code with codable children: Principal | ICD-10-CM

## 2013-10-24 NOTE — Progress Notes (Signed)
HISTORY & PHYSICAL  DATE: 10/21/2013   FACILITY: [facility]  LEVEL OF CARE: SNF (31)  ALLERGIES:  Allergies  Allergen Reactions  . Morphine And Related Hives and Shortness Of Breath  . Statins Hives    Pt notes that "all" classes of cholesterol meds do the same   . Allopurinol Hives, Swelling and Rash    CHIEF COMPLAINT:  Manage right knee osteoarthritis, acute blood loss anemia and diabetes mellitus  HISTORY OF PRESENT ILLNESS: Patient is an 80 year old Caucasian male.  KNEE OSTEOARTHRITIS: Patient had a history of pain and functional disability in the knee due to end-stage osteoarthritis and has failed nonsurgical conservative treatments. Patient had worsening of pain with activity and weight bearing, pain that interfered with activities of daily living & pain with passive range of motion. Therefore patient underwent total knee arthroplasty and tolerated the procedure well. Patient is admitted to this facility for sort short-term rehabilitation. Patient denies knee pain.  ANEMIA: The anemia has been stable. The patient denies fatigue, melena or hematochezia. No complications from the medications currently being used. Postoperatively the patient suffered acute blood loss anemia.  DM:pt's DM remains stable.  Pt denies polyuria, polydipsia, polyphagia, changes in vision or hypoglycemic episodes.  No complications noted from the medication presently being used.  Last hemoglobin A1c is: Not available.  PAST MEDICAL HISTORY :  Past Medical History  Diagnosis Date  . Glaucoma     uses Eye Drops daily  . Dyslipidemia     statin intolerant  . Gout     takes Colchicine and Uloric daily  . BPH (benign prostatic hyperplasia)     [facility]  . History of peptic ulcer disease   . Peptic ulcer disease   . Diverticulosis   . Lumbar spinal stenosis     Most recent MRI 12/ 2011; [facility] - Dr. Nelva Bush  . Carotid atherosclerosis     Dopplers 11/09 showing  50-69% stenosis  . Peripheral arterial disease     [place] health care consult 1/08  . Epigastric pain     Hospitalization 7/11 [place]  . Arthritis     Followed by [facility]  . Myeloma     IgA related; Dr. Marin Olp - 2009  . Headache(784.0)     rarely  . Constipation     but doesn't take any meds  . Urinary frequency   . Anxiety     takes Paxil daily  . Insomnia     takes Ambien nightly   . Anemia     of chronic disease;takes Ferrous Gluconate daily  . Osteomyelitis   . Hypertension     takes Lisinopril daily  . Peripheral edema     takes Furosemide daily  . Pneumonia     hx of-about 78yrs ago  . Peripheral neuropathy   . Joint pain   . Joint swelling   . Chronic back pain     spinal stenosis  . Chronic kidney disease     not on dialysis  . Hypothyroidism     takes Synthroid daily  . GERD (gastroesophageal reflux disease)     takes Protonix daily  . Depression   . Gout   . Diabetes mellitus     takes glipizide daily, Type 2  . Cataract   . Substance abuse     alcohol    PAST SURGICAL HISTORY: Past Surgical History  Procedure Laterality Date  . Abdominal aortic aneurysm  repair  03/1998  . Abdominal hernia repair  2000  . Renal stone surgery  1999  . Nerve root block      Multiple for lumbar spine  . Colonoscopy  07/22/2004    due repeat 2011; Dr. Teena Irani, [facility]  . Eye surgery      bil cataract surgery  . Amputation Left 11/29/2012    Procedure: Left Foot, Great Toe Amputation at MTP Joint;  Surgeon: Newt Minion, MD;  Location: [place];  Service: Orthopedics;  Laterality: Left;  LEFT  Foot, Great Toe Amputation at MTP Joint  . Amputation Left 01/03/2013    Procedure: Left foot 2nd toe amputation at metatarsophalangeal;  Surgeon: Newt Minion, MD;  Location: [place];  Service: Orthopedics;  Laterality: Left;  . Tonsillectomy    . Vasectomy    . Amputation Left 03/19/2013    Procedure: AMPUTATION DIGIT;  Surgeon: Newt Minion, MD;   Location: [place];  Service: Orthopedics;  Laterality: Left;  Left foot Amputation 3, 4, 5th Toes at MTP joint  . Esophagogastroduodenoscopy    . Total knee arthroplasty Right 10/15/2013    Procedure: TOTAL KNEE ARTHROPLASTY;  Surgeon: Newt Minion, MD;  Location: [place];  Service: Orthopedics;  Laterality: Right;  Right Total Knee Arthroplasty  . Hernia repair N/A 2000    abdominal    SOCIAL HISTORY:  reports that he has quit smoking. He has never used smokeless tobacco. He reports that he drinks alcohol. He reports that he does not use illicit drugs.  FAMILY HISTORY:  Family History  Problem Relation Age of Onset  . Diabetes Father   . Glaucoma Father   . Cancer Brother     1 died of lung cancer, 1 of colon, 1 of different cancer  . Glaucoma Daughter   . Glaucoma Son     CURRENT MEDICATIONS: Reviewed per MAR/see medication list  REVIEW OF SYSTEMS:  See HPI otherwise 14 point ROS is negative.  PHYSICAL EXAMINATION  VS:  See VS section  GENERAL: no acute distress, moderately obese body habitus EYES: conjunctivae normal, sclerae normal, normal eye lids MOUTH/THROAT: lips without lesions,no lesions in the mouth,tongue is without lesions,uvula elevates in midline NECK: supple, trachea midline, no neck masses, no thyroid tenderness, no thyromegaly LYMPHATICS: no LAN in the neck, no supraclavicular LAN RESPIRATORY: breathing is even & unlabored, BS CTAB CARDIAC: RRR, no murmur,no extra heart sounds, +2 right lower extremity edema and +1 left lower extremity edema GI:  ABDOMEN: abdomen soft, normal BS, no masses, no tenderness  LIVER/SPLEEN: no hepatomegaly, no splenomegaly MUSCULOSKELETAL: HEAD: normal to inspection & palpation BACK: no kyphosis, scoliosis or spinal processes tenderness EXTREMITIES: LEFT UPPER EXTREMITY: full range of motion, normal strength & tone RIGHT UPPER EXTREMITY:  full range of motion, normal strength & tone LEFT LOWER EXTREMITY:  Moderate range of  motion, normal strength & tone RIGHT LOWER EXTREMITY:  range of motion not tested due to surgery, normal strength & tone PSYCHIATRIC: the patient is alert & oriented to person, affect & behavior appropriate  LABS/RADIOLOGY:  Labs reviewed: Basic Metabolic Panel:  Recent Labs  06/23/13 1141 10/09/13 1034 10/15/13 0642 10/16/13 0610  NA 139 139 139 135*  K 4.5 5.7* 5.4* 4.9  CL 107 105 106 101  CO2 21 20  --  20  GLUCOSE 64* 86 112* 137*  BUN 60* 61* 54* 54*  CREATININE 1.86* 2.01* 2.40* 2.69*  CALCIUM 9.3 9.4  --  8.3*  Liver Function Tests:  Recent Labs  05/07/13 1552 06/23/13 1141 10/09/13 1034  AST 14 17 15   ALT 16 21 18   ALKPHOS 89 79 88  BILITOT 0.1* 0.4 0.3  PROT 6.4 6.3 6.4  ALBUMIN 3.4* 3.7 3.2*    Recent Labs  05/07/13 1658  LIPASE 23   CBC:  Recent Labs  05/07/13 1552  06/23/13 1141  10/16/13 0610 10/17/13 0633 10/18/13 0515  WBC 5.5  < > 5.8  < > 5.5 5.8 5.0  NEUTROABS 2.5  --  2.8  --   --   --   --   HGB 9.8*  < > 10.2*  < > 7.6* 6.8* 7.1*  HCT 28.0*  < > 31.0*  < > 22.8* 20.7* 21.2*  MCV 96.9  < > 97.5  < > 100.4* 100.0 97.2  PLT 195  < > 174  < > 124* 120* 127*  < > = values in this interval not displayed.  Lipid Panel:  Recent Labs  06/23/13 1141  HDL 69   CBG:  Recent Labs  10/19/13 0133 10/19/13 0627 10/19/13 1155  GLUCAP 79 100* 94     ASSESSMENT/PLAN:  Right knee osteoarthritis-status post total knee replacement. Acute blood loss anemia-check hemoglobin level. Continue iron. Diabetes mellitus-continue glipizide Hypothyroidism-continue levothyroxine Hypertension-stable Chronic kidney disease-recheck Check CBC and BMP  I have reviewed patient's medical records received at admission/from hospitalization.  CPT CODE: 59935  Keona Bilyeu Y Prezley Qadir, [place] [phone]

## 2013-10-29 ENCOUNTER — Telehealth: Payer: Self-pay | Admitting: Family Medicine

## 2013-10-29 NOTE — Telephone Encounter (Signed)
Caron called he is at [facility] for rehab of his rt knee replacement, he needed to know who is home health was with and I told him [place] and the nurse was BUZZ.

## 2013-10-30 ENCOUNTER — Other Ambulatory Visit: Payer: Self-pay | Admitting: *Deleted

## 2013-10-30 MED ORDER — HYDROMORPHONE HCL 2 MG PO TABS: ORAL_TABLET | ORAL | Status: DC

## 2013-10-30 NOTE — Telephone Encounter (Signed)
[facility] 

## 2013-11-08 ENCOUNTER — Encounter (HOSPITAL_COMMUNITY): Payer: Self-pay | Admitting: Emergency Medicine

## 2013-11-08 ENCOUNTER — Emergency Department (HOSPITAL_COMMUNITY): Payer: Medicare Other

## 2013-11-08 ENCOUNTER — Inpatient Hospital Stay (HOSPITAL_COMMUNITY)
Admission: EM | Admit: 2013-11-08 | Discharge: 2013-11-13 | DRG: 871 | Disposition: A | Discharge status: Skilled Nursing Facility | Payer: Medicare Other | Attending: Internal Medicine | Admitting: Internal Medicine

## 2013-11-08 DIAGNOSIS — M1A00X1 Idiopathic chronic gout, unspecified site, with tophus (tophi): Secondary | ICD-10-CM | POA: Diagnosis present

## 2013-11-08 DIAGNOSIS — E872 Acidosis, unspecified: Secondary | ICD-10-CM | POA: Diagnosis present

## 2013-11-08 DIAGNOSIS — E119 Type 2 diabetes mellitus without complications: Secondary | ICD-10-CM

## 2013-11-08 DIAGNOSIS — N401 Enlarged prostate with lower urinary tract symptoms: Secondary | ICD-10-CM

## 2013-11-08 DIAGNOSIS — R531 Weakness: Secondary | ICD-10-CM

## 2013-11-08 DIAGNOSIS — S98139A Complete traumatic amputation of one unspecified lesser toe, initial encounter: Secondary | ICD-10-CM

## 2013-11-08 DIAGNOSIS — J069 Acute upper respiratory infection, unspecified: Secondary | ICD-10-CM

## 2013-11-08 DIAGNOSIS — M1990 Unspecified osteoarthritis, unspecified site: Secondary | ICD-10-CM

## 2013-11-08 DIAGNOSIS — I959 Hypotension, unspecified: Secondary | ICD-10-CM

## 2013-11-08 DIAGNOSIS — M129 Arthropathy, unspecified: Secondary | ICD-10-CM | POA: Diagnosis present

## 2013-11-08 DIAGNOSIS — Z96659 Presence of unspecified artificial knee joint: Secondary | ICD-10-CM

## 2013-11-08 DIAGNOSIS — R410 Disorientation, unspecified: Secondary | ICD-10-CM

## 2013-11-08 DIAGNOSIS — G92 Toxic encephalopathy: Secondary | ICD-10-CM | POA: Diagnosis present

## 2013-11-08 DIAGNOSIS — G629 Polyneuropathy, unspecified: Secondary | ICD-10-CM

## 2013-11-08 DIAGNOSIS — X31XXXA Exposure to excessive natural cold, initial encounter: Secondary | ICD-10-CM | POA: Diagnosis present

## 2013-11-08 DIAGNOSIS — N179 Acute kidney failure, unspecified: Secondary | ICD-10-CM | POA: Diagnosis present

## 2013-11-08 DIAGNOSIS — K219 Gastro-esophageal reflux disease without esophagitis: Secondary | ICD-10-CM | POA: Diagnosis present

## 2013-11-08 DIAGNOSIS — Z7982 Long term (current) use of aspirin: Secondary | ICD-10-CM

## 2013-11-08 DIAGNOSIS — A419 Sepsis, unspecified organism: Principal | ICD-10-CM | POA: Diagnosis present

## 2013-11-08 DIAGNOSIS — J189 Pneumonia, unspecified organism: Secondary | ICD-10-CM

## 2013-11-08 DIAGNOSIS — I739 Peripheral vascular disease, unspecified: Secondary | ICD-10-CM | POA: Diagnosis present

## 2013-11-08 DIAGNOSIS — F329 Major depressive disorder, single episode, unspecified: Secondary | ICD-10-CM | POA: Diagnosis present

## 2013-11-08 DIAGNOSIS — M1710 Unilateral primary osteoarthritis, unspecified knee: Secondary | ICD-10-CM

## 2013-11-08 DIAGNOSIS — G929 Unspecified toxic encephalopathy: Secondary | ICD-10-CM | POA: Diagnosis present

## 2013-11-08 DIAGNOSIS — L97909 Non-pressure chronic ulcer of unspecified part of unspecified lower leg with unspecified severity: Secondary | ICD-10-CM | POA: Diagnosis present

## 2013-11-08 DIAGNOSIS — I872 Venous insufficiency (chronic) (peripheral): Secondary | ICD-10-CM | POA: Diagnosis present

## 2013-11-08 DIAGNOSIS — C9000 Multiple myeloma not having achieved remission: Secondary | ICD-10-CM | POA: Diagnosis present

## 2013-11-08 DIAGNOSIS — H409 Unspecified glaucoma: Secondary | ICD-10-CM | POA: Diagnosis present

## 2013-11-08 DIAGNOSIS — G9340 Encephalopathy, unspecified: Secondary | ICD-10-CM

## 2013-11-08 DIAGNOSIS — D638 Anemia in other chronic diseases classified elsewhere: Secondary | ICD-10-CM | POA: Diagnosis present

## 2013-11-08 DIAGNOSIS — N289 Disorder of kidney and ureter, unspecified: Secondary | ICD-10-CM

## 2013-11-08 DIAGNOSIS — E785 Hyperlipidemia, unspecified: Secondary | ICD-10-CM

## 2013-11-08 DIAGNOSIS — E1129 Type 2 diabetes mellitus with other diabetic kidney complication: Secondary | ICD-10-CM | POA: Diagnosis present

## 2013-11-08 DIAGNOSIS — F411 Generalized anxiety disorder: Secondary | ICD-10-CM | POA: Diagnosis present

## 2013-11-08 DIAGNOSIS — N138 Other obstructive and reflux uropathy: Secondary | ICD-10-CM | POA: Diagnosis present

## 2013-11-08 DIAGNOSIS — M48061 Spinal stenosis, lumbar region without neurogenic claudication: Secondary | ICD-10-CM

## 2013-11-08 DIAGNOSIS — N189 Chronic kidney disease, unspecified: Secondary | ICD-10-CM | POA: Diagnosis present

## 2013-11-08 DIAGNOSIS — G4700 Insomnia, unspecified: Secondary | ICD-10-CM | POA: Diagnosis present

## 2013-11-08 DIAGNOSIS — R52 Pain, unspecified: Secondary | ICD-10-CM

## 2013-11-08 DIAGNOSIS — D62 Acute posthemorrhagic anemia: Secondary | ICD-10-CM

## 2013-11-08 DIAGNOSIS — L03119 Cellulitis of unspecified part of limb: Secondary | ICD-10-CM

## 2013-11-08 DIAGNOSIS — T68XXXA Hypothermia, initial encounter: Secondary | ICD-10-CM | POA: Diagnosis present

## 2013-11-08 DIAGNOSIS — IMO0002 Reserved for concepts with insufficient information to code with codable children: Secondary | ICD-10-CM

## 2013-11-08 DIAGNOSIS — Z87891 Personal history of nicotine dependence: Secondary | ICD-10-CM

## 2013-11-08 DIAGNOSIS — E039 Hypothyroidism, unspecified: Secondary | ICD-10-CM | POA: Diagnosis present

## 2013-11-08 DIAGNOSIS — F3289 Other specified depressive episodes: Secondary | ICD-10-CM | POA: Diagnosis present

## 2013-11-08 DIAGNOSIS — L02419 Cutaneous abscess of limb, unspecified: Secondary | ICD-10-CM | POA: Diagnosis present

## 2013-11-08 DIAGNOSIS — I129 Hypertensive chronic kidney disease with stage 1 through stage 4 chronic kidney disease, or unspecified chronic kidney disease: Secondary | ICD-10-CM | POA: Diagnosis present

## 2013-11-08 DIAGNOSIS — D649 Anemia, unspecified: Secondary | ICD-10-CM | POA: Diagnosis present

## 2013-11-08 DIAGNOSIS — F32A Depression, unspecified: Secondary | ICD-10-CM

## 2013-11-08 DIAGNOSIS — I6529 Occlusion and stenosis of unspecified carotid artery: Secondary | ICD-10-CM | POA: Diagnosis present

## 2013-11-08 LAB — CBC
HEMATOCRIT: 22.4 % — AB (ref 39.0–52.0)
HEMOGLOBIN: 7.4 g/dL — AB (ref 13.0–17.0)
MCH: 31.5 pg (ref 26.0–34.0)
MCHC: 33 g/dL (ref 30.0–36.0)
MCV: 95.3 fL (ref 78.0–100.0)
Platelets: 244 10*3/uL (ref 150–400)
RBC: 2.35 MIL/uL — ABNORMAL LOW (ref 4.22–5.81)
RDW: 14.5 % (ref 11.5–15.5)
WBC: 6.3 10*3/uL (ref 4.0–10.5)

## 2013-11-08 LAB — URINE MICROSCOPIC-ADD ON

## 2013-11-08 LAB — COMPREHENSIVE METABOLIC PANEL
ALT: 10 U/L (ref 0–53)
AST: 20 U/L (ref 0–37)
Albumin: 2.8 g/dL — ABNORMAL LOW (ref 3.5–5.2)
Alkaline Phosphatase: 113 U/L (ref 39–117)
BUN: 85 mg/dL — ABNORMAL HIGH (ref 6–23)
CO2: 16 meq/L — AB (ref 19–32)
Calcium: 9.3 mg/dL (ref 8.4–10.5)
Chloride: 101 mEq/L (ref 96–112)
Creatinine, Ser: 3.88 mg/dL — ABNORMAL HIGH (ref 0.50–1.35)
GFR calc Af Amer: 15 mL/min — ABNORMAL LOW (ref >90)
GFR, EST NON AFRICAN AMERICAN: 13 mL/min — AB (ref >90)
GLUCOSE: 81 mg/dL (ref 70–99)
POTASSIUM: 5.2 meq/L (ref 3.7–5.3)
SODIUM: 135 meq/L — AB (ref 137–147)
Total Bilirubin: 0.3 mg/dL (ref 0.3–1.2)
Total Protein: 6.2 g/dL (ref 6.0–8.3)

## 2013-11-08 LAB — CBC WITH DIFFERENTIAL/PLATELET
Basophils Absolute: 0 10*3/uL (ref 0.0–0.1)
Basophils Relative: 0 % (ref 0–1)
EOS PCT: 9 % — AB (ref 0–5)
Eosinophils Absolute: 0.8 10*3/uL — ABNORMAL HIGH (ref 0.0–0.7)
HCT: 21.3 % — ABNORMAL LOW (ref 39.0–52.0)
HEMOGLOBIN: 7.2 g/dL — AB (ref 13.0–17.0)
LYMPHS ABS: 1.7 10*3/uL (ref 0.7–4.0)
LYMPHS PCT: 17 % (ref 12–46)
MCH: 32.4 pg (ref 26.0–34.0)
MCHC: 33.8 g/dL (ref 30.0–36.0)
MCV: 95.9 fL (ref 78.0–100.0)
MONOS PCT: 18 % — AB (ref 3–12)
Monocytes Absolute: 1.7 10*3/uL — ABNORMAL HIGH (ref 0.1–1.0)
Neutro Abs: 5.4 10*3/uL (ref 1.7–7.7)
Neutrophils Relative %: 56 % (ref 43–77)
Platelets: 257 10*3/uL (ref 150–400)
RBC: 2.22 MIL/uL — AB (ref 4.22–5.81)
RDW: 14.4 % (ref 11.5–15.5)
WBC: 9.6 10*3/uL (ref 4.0–10.5)

## 2013-11-08 LAB — URINALYSIS, ROUTINE W REFLEX MICROSCOPIC
Bilirubin Urine: NEGATIVE
Glucose, UA: NEGATIVE mg/dL
Hgb urine dipstick: NEGATIVE
Ketones, ur: NEGATIVE mg/dL
Nitrite: NEGATIVE
Protein, ur: 30 mg/dL — AB
SPECIFIC GRAVITY, URINE: 1.018 (ref 1.005–1.030)
Urobilinogen, UA: 0.2 mg/dL (ref 0.0–1.0)
pH: 5 (ref 5.0–8.0)

## 2013-11-08 LAB — CREATININE, SERUM
Creatinine, Ser: 3.11 mg/dL — ABNORMAL HIGH (ref 0.50–1.35)
GFR calc Af Amer: 20 mL/min — ABNORMAL LOW (ref >90)
GFR calc non Af Amer: 17 mL/min — ABNORMAL LOW (ref >90)

## 2013-11-08 LAB — MRSA PCR SCREENING: MRSA by PCR: NEGATIVE

## 2013-11-08 LAB — GLUCOSE, CAPILLARY: GLUCOSE-CAPILLARY: 83 mg/dL (ref 70–99)

## 2013-11-08 LAB — I-STAT CG4 LACTIC ACID, ED: Lactic Acid, Venous: 0.72 mmol/L (ref 0.5–2.2)

## 2013-11-08 LAB — PREPARE RBC (CROSSMATCH)

## 2013-11-08 MED ORDER — DIPHENHYDRAMINE HCL 50 MG/ML IJ SOLN
25.0000 mg | Freq: Once | INTRAMUSCULAR | Status: AC
  Administered 2013-11-08: 25 mg via INTRAVENOUS
  Filled 2013-11-08: qty 1

## 2013-11-08 MED ORDER — HEPARIN SODIUM (PORCINE) 5000 UNIT/ML IJ SOLN
5000.0000 [IU] | Freq: Three times a day (TID) | INTRAMUSCULAR | Status: DC
  Administered 2013-11-08 – 2013-11-13 (×15): 5000 [IU] via SUBCUTANEOUS
  Filled 2013-11-08 (×17): qty 1

## 2013-11-08 MED ORDER — LEVOTHYROXINE SODIUM 50 MCG PO TABS
50.0000 ug | ORAL_TABLET | Freq: Every day | ORAL | Status: DC
  Administered 2013-11-09 – 2013-11-13 (×5): 50 ug via ORAL
  Filled 2013-11-08 (×6): qty 1

## 2013-11-08 MED ORDER — OMEGA-3-ACID ETHYL ESTERS 1 G PO CAPS
1.0000 g | ORAL_CAPSULE | Freq: Every day | ORAL | Status: DC
  Administered 2013-11-08 – 2013-11-13 (×6): 1 g via ORAL
  Filled 2013-11-08 (×6): qty 1

## 2013-11-08 MED ORDER — ALBUTEROL SULFATE (2.5 MG/3ML) 0.083% IN NEBU: 2.5000 mg | INHALATION_SOLUTION | RESPIRATORY_TRACT | Status: DC | PRN

## 2013-11-08 MED ORDER — NORTRIPTYLINE HCL 25 MG PO CAPS
50.0000 mg | ORAL_CAPSULE | Freq: Every day | ORAL | Status: DC
  Administered 2013-11-08 – 2013-11-12 (×5): 50 mg via ORAL
  Filled 2013-11-08 (×6): qty 2

## 2013-11-08 MED ORDER — SODIUM CHLORIDE 0.9 % IV BOLUS (SEPSIS)
500.0000 mL | Freq: Once | INTRAVENOUS | Status: AC
  Administered 2013-11-08: 500 mL via INTRAVENOUS

## 2013-11-08 MED ORDER — WHITE PETROLATUM GEL
Status: AC
  Administered 2013-11-08: 19:00:00
  Filled 2013-11-08: qty 5

## 2013-11-08 MED ORDER — PAROXETINE HCL 20 MG PO TABS
40.0000 mg | ORAL_TABLET | ORAL | Status: DC
  Administered 2013-11-09 – 2013-11-13 (×5): 40 mg via ORAL
  Filled 2013-11-08 (×6): qty 2

## 2013-11-08 MED ORDER — VANCOMYCIN HCL 10 G IV SOLR: 1.0000 g | Freq: Once | INTRAVENOUS | Status: DC

## 2013-11-08 MED ORDER — ACETAMINOPHEN 325 MG PO TABS
650.0000 mg | ORAL_TABLET | Freq: Once | ORAL | Status: AC
  Administered 2013-11-08: 650 mg via ORAL
  Filled 2013-11-08: qty 2

## 2013-11-08 MED ORDER — MULTI-VITAMIN/MINERALS PO TABS: 1.0000 | ORAL_TABLET | Freq: Every day | ORAL | Status: DC

## 2013-11-08 MED ORDER — SODIUM CHLORIDE 0.9 % IJ SOLN
3.0000 mL | Freq: Two times a day (BID) | INTRAMUSCULAR | Status: DC
  Administered 2013-11-08 – 2013-11-13 (×8): 3 mL via INTRAVENOUS

## 2013-11-08 MED ORDER — GUAIFENESIN-DM 100-10 MG/5ML PO SYRP
5.0000 mL | ORAL_SOLUTION | ORAL | Status: DC | PRN
  Administered 2013-11-08: 5 mL via ORAL
  Filled 2013-11-08 (×2): qty 5

## 2013-11-08 MED ORDER — FEBUXOSTAT 40 MG PO TABS
40.0000 mg | ORAL_TABLET | Freq: Every day | ORAL | Status: DC
  Administered 2013-11-08 – 2013-11-13 (×6): 40 mg via ORAL
  Filled 2013-11-08 (×6): qty 1

## 2013-11-08 MED ORDER — DORZOLAMIDE HCL-TIMOLOL MAL 2-0.5 % OP SOLN
1.0000 [drp] | Freq: Two times a day (BID) | OPHTHALMIC | Status: DC
  Administered 2013-11-08 – 2013-11-13 (×10): 1 [drp] via OPHTHALMIC
  Filled 2013-11-08: qty 10

## 2013-11-08 MED ORDER — PIPERACILLIN-TAZOBACTAM IN DEX 2-0.25 GM/50ML IV SOLN
2.2500 g | Freq: Three times a day (TID) | INTRAVENOUS | Status: DC
  Administered 2013-11-08 – 2013-11-09 (×3): 2.25 g via INTRAVENOUS
  Filled 2013-11-08 (×4): qty 50

## 2013-11-08 MED ORDER — VITAMIN D3 25 MCG (1000 UNIT) PO TABS
1000.0000 [IU] | ORAL_TABLET | Freq: Every day | ORAL | Status: DC
  Administered 2013-11-09 – 2013-11-13 (×5): 1000 [IU] via ORAL
  Filled 2013-11-08 (×5): qty 1

## 2013-11-08 MED ORDER — ONDANSETRON HCL 4 MG PO TABS: 4.0000 mg | ORAL_TABLET | Freq: Four times a day (QID) | ORAL | Status: DC | PRN

## 2013-11-08 MED ORDER — VANCOMYCIN HCL 10 G IV SOLR
1500.0000 mg | INTRAVENOUS | Status: DC
  Administered 2013-11-08 – 2013-11-10 (×2): 1500 mg via INTRAVENOUS
  Filled 2013-11-08 (×2): qty 1500

## 2013-11-08 MED ORDER — ACETAMINOPHEN 650 MG RE SUPP: 650.0000 mg | Freq: Four times a day (QID) | RECTAL | Status: DC | PRN

## 2013-11-08 MED ORDER — LATANOPROST 0.005 % OP SOLN
1.0000 [drp] | Freq: Every day | OPHTHALMIC | Status: DC
  Administered 2013-11-08 – 2013-11-12 (×5): 1 [drp] via OPHTHALMIC
  Filled 2013-11-08: qty 2.5

## 2013-11-08 MED ORDER — ADULT MULTIVITAMIN W/MINERALS CH
1.0000 | ORAL_TABLET | Freq: Every day | ORAL | Status: DC
  Administered 2013-11-09 – 2013-11-13 (×5): 1 via ORAL
  Filled 2013-11-08 (×5): qty 1

## 2013-11-08 MED ORDER — PIPERACILLIN-TAZOBACTAM 3.375 G IVPB 30 MIN
3.3750 g | Freq: Once | INTRAVENOUS | Status: AC
Start: 2013-11-08 — End: 2013-11-08
  Administered 2013-11-08: 3.375 g via INTRAVENOUS
  Filled 2013-11-08: qty 50

## 2013-11-08 MED ORDER — ONDANSETRON HCL 4 MG/2ML IJ SOLN: 4.0000 mg | Freq: Four times a day (QID) | INTRAMUSCULAR | Status: DC | PRN

## 2013-11-08 MED ORDER — VANCOMYCIN HCL IN DEXTROSE 1-5 GM/200ML-% IV SOLN
1000.0000 mg | Freq: Once | INTRAVENOUS | Status: AC
  Administered 2013-11-08: 1000 mg via INTRAVENOUS
  Filled 2013-11-08: qty 200

## 2013-11-08 MED ORDER — SODIUM CHLORIDE 0.9 % IV BOLUS (SEPSIS)
1000.0000 mL | Freq: Once | INTRAVENOUS | Status: AC
  Administered 2013-11-08: 1000 mL via INTRAVENOUS

## 2013-11-08 MED ORDER — SODIUM CHLORIDE 0.9 % IV SOLN
Freq: Once | INTRAVENOUS | Status: AC
Start: 2013-11-08 — End: 2013-11-08
  Administered 2013-11-08: 08:00:00 via INTRAVENOUS

## 2013-11-08 MED ORDER — ACETAMINOPHEN 325 MG PO TABS
650.0000 mg | ORAL_TABLET | Freq: Four times a day (QID) | ORAL | Status: DC | PRN
  Administered 2013-11-11 – 2013-11-13 (×3): 650 mg via ORAL
  Filled 2013-11-08 (×3): qty 2

## 2013-11-08 MED ORDER — FERROUS GLUCONATE 324 (38 FE) MG PO TABS
325.0000 mg | ORAL_TABLET | Freq: Every day | ORAL | Status: DC
  Administered 2013-11-09: 325 mg via ORAL
  Administered 2013-11-10: 324 mg via ORAL
  Administered 2013-11-11: 325 mg via ORAL
  Administered 2013-11-12: 324 mg via ORAL
  Administered 2013-11-13: 325 mg via ORAL
  Filled 2013-11-08 (×6): qty 1

## 2013-11-08 MED ORDER — SACCHAROMYCES BOULARDII 250 MG PO CAPS
250.0000 mg | ORAL_CAPSULE | Freq: Every day | ORAL | Status: DC
  Administered 2013-11-09 – 2013-11-13 (×5): 250 mg via ORAL
  Filled 2013-11-08 (×5): qty 1

## 2013-11-08 MED ORDER — INSULIN ASPART 100 UNIT/ML ~~LOC~~ SOLN
0.0000 [IU] | Freq: Three times a day (TID) | SUBCUTANEOUS | Status: DC
  Administered 2013-11-09: 2 [IU] via SUBCUTANEOUS
  Administered 2013-11-10 – 2013-11-13 (×7): 1 [IU] via SUBCUTANEOUS

## 2013-11-08 MED ORDER — HYDROCODONE-ACETAMINOPHEN 5-325 MG PO TABS
1.0000 | ORAL_TABLET | ORAL | Status: DC | PRN
  Administered 2013-11-10 – 2013-11-11 (×3): 1 via ORAL
  Filled 2013-11-08 (×3): qty 1

## 2013-11-08 MED ORDER — PANTOPRAZOLE SODIUM 40 MG PO TBEC
40.0000 mg | DELAYED_RELEASE_TABLET | Freq: Every day | ORAL | Status: DC
  Administered 2013-11-08 – 2013-11-13 (×6): 40 mg via ORAL
  Filled 2013-11-08 (×5): qty 1

## 2013-11-08 MED ORDER — ASPIRIN EC 81 MG PO TBEC
81.0000 mg | DELAYED_RELEASE_TABLET | Freq: Every day | ORAL | Status: DC
  Administered 2013-11-08 – 2013-11-13 (×6): 81 mg via ORAL
  Filled 2013-11-08 (×6): qty 1

## 2013-11-08 MED ORDER — SODIUM CHLORIDE 0.9 % IV SOLN
INTRAVENOUS | Status: DC
  Administered 2013-11-08 – 2013-11-09 (×2): via INTRAVENOUS

## 2013-11-08 NOTE — ED Notes (Signed)
Entered room to re-assess. Patient found to have low blood pressure reading again. MD [facility] informed.

## 2013-11-08 NOTE — H&P (Addendum)
PATIENT DETAILS Name: Zachary Vargas Age: 80 y.o. Sex: male Date of Birth: [birth year] Admit Date: 11/08/2013 ION:[id number], DAVID Audelia [place]on, PA-C   CHIEF COMPLAINT:  Altered Mental Status  HPI: Zachary Vargas is a 80 y.o. male with a Past Medical History of recent total knee replacement on the right undergoing rehab at a SNF after his recent discharge on May 10 th, his post operative course was complicated by the development of acute blood loss anemia  who presents today with the above noted complaint. Please note, most of this history is obtained after speaking with the ED physician, ED RN and with the patient, patient still appears to be confused somewhat, and is only able to provide some collaborative history. Apparently while at the skilled nursing facility, patient was recently diagnosed with pneumonia and started on Avelox, yesterday he claims he was in his usual state of health, however he was transferred to the emergency room for evaluation of fever, and altered mental status. On arrival to the emergency room, patient was noted to be hypotensive and hypothermic, and given IV fluid boluses with good response. Chest x-ray was suggestive of chronic changes but could not rule out pneumonia, urine analysis was negative for UTI, I was subsequently asked to admit this patient for further evaluation and treatment. From what I can tell after talking with the patient and the staff, there is no history of nausea, vomiting or diarrhea. Patient denies any abdominal pain. It appears that he has chronic bilateral skin changes in his lower legs, his right knee is nontender, incision site looks intact.  ALLERGIES:   Allergies  Allergen Reactions  . Morphine And Related Hives and Shortness Of Breath  . Statins Hives    Pt notes that "all" classes of cholesterol meds do the same   . Allopurinol Hives, Swelling and Rash    PAST MEDICAL HISTORY: Past Medical History  Diagnosis Date  . Glaucoma      uses Eye Drops daily  . Dyslipidemia     statin intolerant  . Gout     takes Colchicine and Uloric daily  . BPH (benign prostatic hyperplasia)     [facility]  . History of peptic ulcer disease   . Peptic ulcer disease   . Diverticulosis   . Lumbar spinal stenosis     Most recent MRI 12/ 2011; [facility] - Dr. Nelva Bush  . Carotid atherosclerosis     Dopplers 11/09 showing 50-69% stenosis  . Peripheral arterial disease     [facility] consult 1/08  . Epigastric pain     Hospitalization 7/11 [place]  . Arthritis     Followed by [facility]  . Myeloma     IgA related; Dr. Marin Olp - 2009  . Headache(784.0)     rarely  . Constipation     but doesn't take any meds  . Urinary frequency   . Anxiety     takes Paxil daily  . Insomnia     takes Ambien nightly   . Anemia     of chronic disease;takes Ferrous Gluconate daily  . Osteomyelitis   . Hypertension     takes Lisinopril daily  . Peripheral edema     takes Furosemide daily  . Pneumonia     hx of-about 67yrs ago  . Peripheral neuropathy   . Joint pain   . Joint swelling   . Chronic back pain     spinal stenosis  .  Chronic kidney disease     not on dialysis  . Hypothyroidism     takes Synthroid daily  . GERD (gastroesophageal reflux disease)     takes Protonix daily  . Depression   . Gout   . Diabetes mellitus     takes glipizide daily, Type 2  . Cataract   . Substance abuse     alcohol    PAST SURGICAL HISTORY: Past Surgical History  Procedure Laterality Date  . Abdominal aortic aneurysm repair  03/1998  . Abdominal hernia repair  2000  . Renal stone surgery  1999  . Nerve root block      Multiple for lumbar spine  . Colonoscopy  07/22/2004    due repeat 2011; Dr. Teena Irani, [facility]  . Eye surgery      bil cataract surgery  . Amputation Left 11/29/2012    Procedure: Left Foot, Great Toe Amputation at MTP Joint;  Surgeon: Newt Minion, MD;  Location: [facility];  Service:  Orthopedics;  Laterality: Left;  LEFT  Foot, Great Toe Amputation at MTP Joint  . Amputation Left 01/03/2013    Procedure: Left foot 2nd toe amputation at metatarsophalangeal;  Surgeon: Newt Minion, MD;  Location: [place];  Service: Orthopedics;  Laterality: Left;  . Tonsillectomy    . Vasectomy    . Amputation Left 03/19/2013    Procedure: AMPUTATION DIGIT;  Surgeon: Newt Minion, MD;  Location: [facility];  Service: Orthopedics;  Laterality: Left;  Left foot Amputation 3, 4, 5th Toes at MTP joint  . Esophagogastroduodenoscopy    . Total knee arthroplasty Right 10/15/2013    Procedure: TOTAL KNEE ARTHROPLASTY;  Surgeon: Newt Minion, MD;  Location: [place];  Service: Orthopedics;  Laterality: Right;  Right Total Knee Arthroplasty  . Hernia repair N/A 2000    abdominal    MEDICATIONS AT HOME: Prior to Admission medications   Medication Sig Start Date End Date Taking? Authorizing Provider  acetaminophen (TYLENOL) 500 MG tablet Take 1 tablet (500 mg total) by mouth every 6 (six) hours as needed for mild pain. 10/17/13  Yes Newt Minion, MD  aspirin EC 81 MG tablet Take 1 tablet (81 mg total) by mouth daily. 10/17/13  Yes Newt Minion, MD  cholecalciferol (VITAMIN D) 1000 UNITS tablet Take 1,000 Units by mouth daily.   Yes Historical Provider, MD  colchicine (COLCRYS) 0.6 MG tablet Take 0.6 mg by mouth daily.    Yes Historical Provider, MD  dorzolamide-timolol (COSOPT) 22.3-6.8 MG/ML ophthalmic solution Place 1 drop into both eyes 2 (two) times daily.    Yes Historical Provider, MD  doxycycline (VIBRA-TABS) 100 MG tablet Take 100 mg by mouth 2 (two) times daily. x30 days, starting on 10/22/13   Yes Historical Provider, MD  febuxostat (ULORIC) 40 MG tablet Take 40 mg by mouth daily.   Yes Historical Provider, MD  ferrous gluconate (FERGON) 325 MG tablet Take 325 mg by mouth daily with breakfast.     Yes Historical Provider, MD  furosemide (LASIX) 40 MG tablet Take 1 tablet (40 mg total) by mouth daily.  12/25/12  Yes Denita Lung, MD  glipiZIDE (GLUCOTROL) 10 MG tablet Take 5 mg by mouth daily before breakfast.   Yes Historical Provider, MD  HYDROcodone-acetaminophen (NORCO/VICODIN) 5-325 MG per tablet Take 1 tablet by mouth every 6 (six) hours as needed for moderate pain.   Yes Historical Provider, MD  hydrOXYzine (ATARAX/VISTARIL) 25 MG tablet Take 25 mg by  mouth 3 (three) times daily.   Yes Historical Provider, MD  latanoprost (XALATAN) 0.005 % ophthalmic solution Place 1 drop into the right eye at bedtime.     Yes Historical Provider, MD  levothyroxine (SYNTHROID, LEVOTHROID) 50 MCG tablet Take 50 mcg by mouth daily before breakfast.   Yes Historical Provider, MD  lisinopril (PRINIVIL,ZESTRIL) 10 MG tablet Take 1 tablet (10 mg total) by mouth daily. 06/23/13  Yes Denita Lung, MD  moxifloxacin (AVELOX) 400 MG tablet Take 400 mg by mouth daily at 8 pm.   Yes Historical Provider, MD  Multiple Vitamins-Minerals (MULTIVITAMIN WITH MINERALS) tablet Take 1 tablet by mouth daily.   Yes Historical Provider, MD  nortriptyline (PAMELOR) 50 MG capsule Take 1 capsule (50 mg total) by mouth at bedtime. 09/02/13  Yes Denita Lung, MD  Omega-3 Fatty Acids (FISH OIL) 1000 MG CAPS Take 1,000 mg by mouth daily.   Yes Historical Provider, MD  pantoprazole (PROTONIX) 20 MG tablet Take 1 tablet (20 mg total) by mouth daily. 05/07/13  Yes Blanchard Kelch, MD  PARoxetine (PAXIL) 40 MG tablet Take 40 mg by mouth every morning.   Yes Historical Provider, MD  saccharomyces boulardii (FLORASTOR) 250 MG capsule Take 250 mg by mouth daily.   Yes Historical Provider, MD  zolpidem (AMBIEN) 5 MG tablet Take 1 tablet (5 mg total) by mouth at bedtime as needed for sleep. 07/25/13   Denita Lung, MD    FAMILY HISTORY: Family History  Problem Relation Age of Onset  . Diabetes Father   . Glaucoma Father   . Cancer Brother     1 died of lung cancer, 1 of colon, 1 of different cancer  . Glaucoma Daughter   . Glaucoma  Son     SOCIAL HISTORY:  reports that he has quit smoking. He has never used smokeless tobacco. He reports that he drinks alcohol. He reports that he does not use illicit drugs.  REVIEW OF SYSTEMS:  Constitutional:   No  weight loss, night sweats.  HEENT:    No headaches, Difficulty swallowing,Tooth/dental problems,Sore throat,  No sneezing, itching, ear ache, nasal congestion, post nasal drip,   Cardio-vascular: No chest pain,  Orthopnea, PND, swelling in lower extremities, anasarca,  dizziness, palpitations  GI:  No heartburn, indigestion, abdominal pain, nausea, vomiting, diarrhea, change in  bowel habits, loss of appetite  Resp: No shortness of breath at rest.No wheezing.No chest wall deformity  Skin:  no rash or lesions.  GU:  no dysuria, change in color of urine, no urgency or frequency.  No flank pain.  Musculoskeletal: No joint pain or swelling.  No decreased range of motion.  No back pain.  Psych: No change in mood or affect. No depression or anxiety.  No memory loss.   PHYSICAL EXAM: Blood pressure 106/35, pulse 87, temperature 96.8 F (36 C), temperature source Rectal, resp. rate 19, SpO2 98.00%.  General appearance :Awake, somewhat alert-only follows some questions, not in any distress. Speech Clear. HEENT: Atraumatic and Normocephalic, pupils equally reactive to light and accomodation Neck: supple, no JVD. No cervical lymphadenopathy.  Chest:Good air entry bilaterally, no added sounds -some transmitted upper airway sounds CVS: S1 S2 regular Abdomen: Bowel sounds present, Non tender and not distended with no gaurding, rigidity or rebound. Extremities: B/L Lower Ext shows 1-2+ edema, both legs are warm to touch-there is b/l erythema in his legs, and some skin tears left>right leg Neurology:  Non focal Skin:No Rash Wounds:N/A  LABS ON  ADMISSION:   Recent Labs  11/08/13 0520  NA 135*  K 5.2  CL 101  CO2 16*  GLUCOSE 81  BUN 85*  CREATININE 3.88*    CALCIUM 9.3    Recent Labs  11/08/13 0520  AST 20  ALT 10  ALKPHOS 113  BILITOT 0.3  PROT 6.2  ALBUMIN 2.8*   No results found for this basename: LIPASE, AMYLASE,  in the last 72 hours  Recent Labs  11/08/13 0520  WBC 9.6  NEUTROABS 5.4  HGB 7.2*  HCT 21.3*  MCV 95.9  PLT 257   No results found for this basename: CKTOTAL, CKMB, CKMBINDEX, TROPONINI,  in the last 72 hours No results found for this basename: DDIMER,  in the last 72 hours No components found with this basename: POCBNP,    RADIOLOGIC STUDIES ON ADMISSION: Ct Head Wo Contrast  11/08/2013   CLINICAL DATA:  Altered mental status  EXAM: CT HEAD WITHOUT CONTRAST  TECHNIQUE: Contiguous axial images were obtained from the base of the skull through the vertex without intravenous contrast.  COMPARISON:  None.  FINDINGS: The bony calvarium is intact. No gross soft tissue abnormality is noted. Atrophic changes and chronic white matter ischemic change is identified. No findings to suggest acute hemorrhage, acute infarction or space-occupying mass lesion are noted.  IMPRESSION: Chronic changes without acute abnormality.   Electronically Signed   By: Inez Catalina M.D.   On: 11/08/2013 07:54   Ct Chest Wo Contrast  11/08/2013   CLINICAL DATA:  Cough and hypotension  EXAM: CT CHEST WITHOUT CONTRAST  TECHNIQUE: Multidetector CT imaging of the chest was performed following the standard protocol without IV contrast.  COMPARISON:  11/08/2013, 11/29/2012, 04/29/2012 and 10/15/2009  FINDINGS: The lungs are again well aerated bilaterally. Diffuse fibrotic changes are again seen with some associated calcifications most consistent with prior granulomatous disease. No focal confluent infiltrate is identified. The overall appearance is similar to that seen on prior CT examinations. No sizable effusion is seen.  The hilar and mediastinal structures show no significant lymphadenopathy. Heavy coronary calcifications are noted. Aortic  calcifications without aneurysmal dilatation are seen. The visualized upper abdomen is within normal limits. The osseous structures show degenerative change of the lumbar spine as well as prior right rib fractures. .  IMPRESSION: Chronic changes in both lungs most consistent with prior granulomatous disease. The overall appearance is stable from previous CTs dating back to 2011.  No acute infiltrate is seen.  Chronic changes without acute abnormality.   Electronically Signed   By: Inez Catalina M.D.   On: 11/08/2013 08:02   Dg Chest Port 1 View  11/08/2013   CLINICAL DATA:  Cough, altered mental status, hypotension  EXAM: PORTABLE CHEST - 1 VIEW  COMPARISON:  Prior radiograph from 11/29/2012  FINDINGS: Cardiomegaly is stable as compared to prior study. Atherosclerotic calcifications present within the aortic arch.  Lungs are mildly hypoinflated with fibrotic lung changes again seen. Superimposed irregular interstitial opacities are seen within the right mid and lower lung, suspicious for possible superimposed infection. Additional increased interstitial opacity seen within the left lung base as well. No pulmonary edema or definite pleural effusion. No pneumothorax.  No acute osseous abnormality. Prominent degenerative changes noted at the right shoulder.  IMPRESSION: 1. Chronic fibrotic lung changes with increased interstitial opacities within the right mid lung and bilateral lung bases. While findings may reflect progressive fibrosis, possible superimposed infection could be considered in the correct clinical setting. 2. Stable cardiomegaly.  Electronically Signed   By: Jeannine Boga M.D.   On: 11/08/2013 05:48     ASSESSMENT AND PLAN: Present on Admission:  . Encephalopathy acute - Suspected toxic metabolic encephalopathy from worsening renal failure, and underlying sepsis. CT head negative, treated with IV fluids, IV antibiotics, follow renal function.   . Sepsis - Presented with hypothermia  and hypotension, no obvious source of infection evident-chest x-ray/CT chest negative for pneumonia, UA not consistent with UTI. He does have some erythema in his bilateral lower extremities, however some of it appears to be chronic-not sure if this cellulitis - Will treat with IV fluid resuscitation, empiric vancomycin and Zosyn. Have asked Dr. Sharol Given to evaluate his right knee, although I doubt that this is infected. Follow blood cultures.  - Hopefully his blood pressure will respond to IV fluids resuscitation, if not, will consult PCCM.   Marland Kitchen Acute-on-chronic renal failure - Likely secondary to sepsis, and use of Lasix, and lisinopril. - Hold Lasix, lisinopril, colchicine, hydrate and follow clinical course. - If renal function continues to worsen, we'll then initiate further workup including renal ultrasound, and also consult nephrology. -strict intake and output  .Metabolic Acidosis - Likely secondary to worsening renal failure. Treat with IV fluids, try and reverse renal failure, follow electrolytes closely.  . Anemia, unspecified - Suspect anemia secondary to recent blood loss post right knee replacement. Patient claims to be very fatigued and tired, as a result we'll transfuse 2 units of PRBC.  - No obvious GI loss evident, continue iron supplementation.   . Chronic gouty arthropathy with tophus (tophi) - Continue  Uloric, hold  Colchicine for now- if an acute flare occurs-start prednisone  . Depression - Cautiously continue with Celexa and nortriptyline.   . Hypothyroidism - Continue levothyroxine   . Type II or unspecified type diabetes mellitus without mention of complication, not stated as uncontrolled - Hold hold oral hypoglycemic agents, start SSI.  Further plan will depend as patient's clinical course evolves and further radiologic and laboratory data become available. Patient will be monitored closely.  Please note patient's son was called-left messge.  DVT  Prophylaxis: Prophylactic Lovenox  Code Status: Full Code  Total time spent for admission equals 45 minutes.  Jonetta Osgood [facility] Pager [phone]  If 7PM-7AM, please contact night-coverage [email or web address] Password [email or web address] 11/08/2013, 8:43 AM  **Disclaimer: This note may have been dictated with voice recognition software. Similar sounding words can inadvertently be transcribed and this note may contain transcription errors which may not have been corrected upon publication of note.**

## 2013-11-08 NOTE — ED Notes (Signed)
Pt belongings and dentures sent with pt to [facility]

## 2013-11-08 NOTE — ED Notes (Signed)
BP on monitor reading 63/45, RN rechecked BP and got 72/44. MD notified and MD at bedside. Will check manually.

## 2013-11-08 NOTE — Progress Notes (Signed)
ANTIBIOTIC CONSULT NOTE - INITIAL  Pharmacy Consult for Zosyn and Vancomycin Indication: sepsis  Allergies  Allergen Reactions  . Morphine And Related Hives and Shortness Of Breath  . Statins Hives    Pt notes that "all" classes of cholesterol meds do the same   . Allopurinol Hives, Swelling and Rash    Patient Measurements:   Ht: 76 in   Wt: 116 kg  Vital Signs: Temp: 96.8 F (36 C) (05/30 0430) Temp src: Rectal (05/30 0430) BP: 113/50 mmHg (05/30 1515) Pulse Rate: 93 (05/30 1515) Intake/Output from previous day:   Intake/Output from this shift:    Labs:  Recent Labs  11/08/13 0520  WBC 9.6  HGB 7.2*  PLT 257  CREATININE 3.88*   The CrCl is unknown because both a height and weight (above a minimum accepted value) are required for this calculation. No results found for this basename: VANCOTROUGH, VANCOPEAK, VANCORANDOM, GENTTROUGH, GENTPEAK, GENTRANDOM, TOBRATROUGH, TOBRAPEAK, TOBRARND, AMIKACINPEAK, AMIKACINTROU, AMIKACIN,  in the last 72 hours   Microbiology: No results found for this or any previous visit (from the past 720 hour(s)).  Medical History: Past Medical History  Diagnosis Date  . Glaucoma     uses Eye Drops daily  . Dyslipidemia     statin intolerant  . Gout     takes Colchicine and Uloric daily  . BPH (benign prostatic hyperplasia)     [facility]  . History of peptic ulcer disease   . Peptic ulcer disease   . Diverticulosis   . Lumbar spinal stenosis     Most recent MRI 12/ 2011; [facility] - Dr. Nelva Bush  . Carotid atherosclerosis     Dopplers 11/09 showing 50-69% stenosis  . Peripheral arterial disease     [place] [facility] care consult 1/08  . Epigastric pain     Hospitalization 7/11 [place]  . Arthritis     [facility]ed [facility]  . Myeloma     IgA related; Dr. Marin Olp - 2009  . Headache(784.0)     rarely  . Constipation     but doesn't take any meds  . Urinary frequency   . Anxiety     takes Paxil daily  .  Insomnia     takes Ambien nightly   . Anemia     of chronic disease;takes Ferrous Gluconate daily  . Osteomyelitis   . Hypertension     takes Lisinopril daily  . Peripheral edema     takes Furosemide daily  . Pneumonia     hx of-about 11yrs ago  . Peripheral neuropathy   . Joint pain   . Joint swelling   . Chronic back pain     spinal stenosis  . Chronic kidney disease     not on dialysis  . Hypothyroidism     takes Synthroid daily  . GERD (gastroesophageal reflux disease)     takes Protonix daily  . Depression   . Gout   . Diabetes mellitus     takes glipizide daily, Type 2  . Cataract   . Substance abuse     alcohol    Medications:  See electronic med rec  Assessment:  80 y.o. male presents from SNF with sepsis - suspected source leg cellulitis. S/p R TKA 10/15/2013 - per Dr. Sharol Given, no signs of infection in R TKA but noted cellulitis in both legs. To continue broad spectum antibiotics (Vanc and Zosyn). Vanc 1gm and Zosyn 3.375gm IV given this a.m. ~0800. SCr up to  3.88, est CrCl ~15 ml/min.  Goal of Therapy:  Vancomycin trough level 10-15 mcg/ml  Plan:  1. Vancomycin 1500mg  IV q48h - first dose now. This will make loading dose today equal ~2.5gm total 2. Zosyn 2.25gm IV q8h 3. Will f/u micro data, pt's clinical condition, renal function, vanc trough at [facility]  Sherlon Handing, PharmD, BCPS Clinical pharmacist, pager [phone] 11/08/2013,3:38 PM

## 2013-11-08 NOTE — Consult Note (Signed)
Reason for Consult: Sepsis rule out infection right total knee arthroplasty Referring Physician: Dr Imogene Burn is an 80 y.o. male.  HPI: Patient is an 80 year old gentleman who is status post right total knee arthroplasty. Patient was admitted to skilled nursing postoperatively and was discharged to home. Patient presents with confusion and symptoms consistent with sepsis.  Past Medical History  Diagnosis Date  . Glaucoma     uses Eye Drops daily  . Dyslipidemia     statin intolerant  . Gout     takes Colchicine and Uloric daily  . BPH (benign prostatic hyperplasia)     [facility]  . History of peptic ulcer disease   . Peptic ulcer disease   . Diverticulosis   . Lumbar spinal stenosis     Most recent MRI 12/ 2011; [facility] - Dr. Nelva Bush  . Carotid atherosclerosis     Dopplers 11/09 showing 50-69% stenosis  . Peripheral arterial disease     [place] [facility] care consult 1/08  . Epigastric pain     Hospitalization 7/11 [place]  . Arthritis     Followed by [facility]  . Myeloma     IgA related; Dr. Marin Olp - 2009  . Headache(784.0)     rarely  . Constipation     but doesn't take any meds  . Urinary frequency   . Anxiety     takes Paxil daily  . Insomnia     takes Ambien nightly   . Anemia     of chronic disease;takes Ferrous Gluconate daily  . Osteomyelitis   . Hypertension     takes Lisinopril daily  . Peripheral edema     takes Furosemide daily  . Pneumonia     hx of-about 39yr ago  . Peripheral neuropathy   . Joint pain   . Joint swelling   . Chronic back pain     spinal stenosis  . Chronic kidney disease     not on dialysis  . Hypothyroidism     takes Synthroid daily  . GERD (gastroesophageal reflux disease)     takes Protonix daily  . Depression   . Gout   . Diabetes mellitus     takes glipizide daily, Type 2  . Cataract   . Substance abuse     alcohol    Past Surgical History  Procedure Laterality Date  . Abdominal  aortic aneurysm repair  03/1998  . Abdominal hernia repair  2000  . Renal stone surgery  1999  . Nerve root block      Multiple for lumbar spine  . Colonoscopy  07/22/2004    [facility] 2011[facility]  . Eye surgery      bil cataract surgery  . Amputation Left 11/29/2012    Procedure: Left Foot, Great Toe Amputation at MTP Joint;  Surgeon: MNewt Minion MD;  [place]  Service: [place]opedics;  Laterality: Left;  LEFT  Foot, Great Toe Amputation at MTP Joint  . Amputation Left 01/03/2013    Procedure: Left foot 2nd toe amputation at metatarsophalangeal;  Surgeon: MNewt Minion MD;  [place]  Service: Orthopedics;  Laterality: Left;  . Tonsillectomy    . Vasectomy    . Amputation Left 03/19/2013    Procedure: AMPUTATION DIGIT;  Surgeon: MNewt Minion MD;  [place]  Service: [place]opedics;  Laterality: Left;  Left foot Amputation 3, 4, 5th Toes at MTP joint  .  Esophagogastroduodenoscopy    . Total knee arthroplasty Right 10/15/2013    Procedure: TOTAL KNEE ARTHROPLASTY;  Surgeon: Newt Minion, MD;  Location: [place];  Service: Orthopedics;  Laterality: Right;  Right Total Knee Arthroplasty  . Hernia repair N/A 2000    abdominal    Family History  Problem Relation Age of Onset  . Diabetes Father   . Glaucoma Father   . Cancer Brother     1 died of lung cancer, 1 of colon, 1 of different cancer  . Glaucoma Daughter   . Glaucoma Son     Social History:  reports that he has quit smoking. He has never used smokeless tobacco. He reports that he drinks alcohol. He reports that he does not use illicit drugs.  Allergies:  Allergies  Allergen Reactions  . Morphine And Related Hives and Shortness Of Breath  . Statins Hives    Pt notes that "all" classes of cholesterol meds do the same   . Allopurinol Hives, Swelling and Rash    Medications: I have reviewed the patient's current medications.  Results for orders placed during the hospital  encounter of 11/08/13 (from the past 48 hour(s))  CBC WITH DIFFERENTIAL     Status: Abnormal   Collection Time    11/08/13  5:20 AM      Result Value Ref Range   WBC 9.6  4.0 - 10.5 K/uL   RBC 2.22 (*) 4.22 - 5.81 MIL/uL   Hemoglobin 7.2 (*) 13.0 - 17.0 g/dL   HCT 21.3 (*) 39.0 - 52.0 %   MCV 95.9  78.0 - 100.0 fL   MCH 32.4  26.0 - 34.0 pg   MCHC 33.8  30.0 - 36.0 g/dL   RDW 14.4  11.5 - 15.5 %   Platelets 257  150 - 400 K/uL   Neutrophils Relative % 56  43 - 77 %   Neutro Abs 5.4  1.7 - 7.7 K/uL   Lymphocytes Relative 17  12 - 46 %   Lymphs Abs 1.7  0.7 - 4.0 K/uL   Monocytes Relative 18 (*) 3 - 12 %   Monocytes Absolute 1.7 (*) 0.1 - 1.0 K/uL   Eosinophils Relative 9 (*) 0 - 5 %   Eosinophils Absolute 0.8 (*) 0.0 - 0.7 K/uL   Basophils Relative 0  0 - 1 %   Basophils Absolute 0.0  0.0 - 0.1 K/uL  COMPREHENSIVE METABOLIC PANEL     Status: Abnormal   Collection Time    11/08/13  5:20 AM      Result Value Ref Range   Sodium 135 (*) 137 - 147 mEq/L   Potassium 5.2  3.7 - 5.3 mEq/L   Chloride 101  96 - 112 mEq/L   CO2 16 (*) 19 - 32 mEq/L   Glucose, Bld 81  70 - 99 mg/dL   BUN 85 (*) 6 - 23 mg/dL   Creatinine, Ser 3.88 (*) 0.50 - 1.35 mg/dL   Calcium 9.3  8.4 - 10.5 mg/dL   Total Protein 6.2  6.0 - 8.3 g/dL   Albumin 2.8 (*) 3.5 - 5.2 g/dL   AST 20  0 - 37 U/L   ALT 10  0 - 53 U/L   Alkaline Phosphatase 113  39 - 117 U/L   Total Bilirubin 0.3  0.3 - 1.2 mg/dL   GFR calc non Af [name] 13 (*) >90 mL/min   GFR calc Af [name] 15 (*) >90 mL/min   Comment: (  NOTE)     The eGFR has been calculated using the CKD EPI equation.     This calculation has not been validated in all clinical situations.     eGFR's persistently <90 mL/min signify possible Chronic Kidney     Disease.  URINALYSIS, ROUTINE W REFLEX MICROSCOPIC     Status: Abnormal   Collection Time    11/08/13  5:25 AM      Result Value Ref Range   Color, Urine YELLOW  YELLOW   APPearance CLOUDY (*) CLEAR   Specific  Gravity, Urine 1.018  1.005 - 1.030   pH 5.0  5.0 - 8.0   Glucose, UA NEGATIVE  NEGATIVE mg/dL   Hgb urine dipstick NEGATIVE  NEGATIVE   Bilirubin Urine NEGATIVE  NEGATIVE   Ketones, ur NEGATIVE  NEGATIVE mg/dL   Protein, ur 30 (*) NEGATIVE mg/dL   Urobilinogen, UA 0.2  0.0 - 1.0 mg/dL   Nitrite NEGATIVE  NEGATIVE   Leukocytes, UA SMALL (*) NEGATIVE  URINE MICROSCOPIC-ADD ON     Status: Abnormal   Collection Time    11/08/13  5:25 AM      Result Value Ref Range   Squamous Epithelial / LPF RARE  RARE   WBC, UA 3-6  <3 WBC/hpf   RBC / HPF 7-10  <3 RBC/hpf   Bacteria, UA RARE  RARE   Casts HYALINE CASTS (*) NEGATIVE  I-STAT CG4 LACTIC ACID, ED     Status: None   Collection Time    11/08/13  5:51 AM      Result Value Ref Range   Lactic Acid, Venous 0.72  0.5 - 2.2 mmol/L    Ct Head Wo Contrast  11/08/2013   CLINICAL DATA:  Altered mental status  EXAM: CT HEAD WITHOUT CONTRAST  TECHNIQUE: Contiguous axial images were obtained from the base of the skull through the vertex without intravenous contrast.  COMPARISON:  None.  FINDINGS: The bony calvarium is intact. No gross soft tissue abnormality is noted. Atrophic changes and chronic white matter ischemic change is identified. No findings to suggest acute hemorrhage, acute infarction or space-occupying mass lesion are noted.  IMPRESSION: Chronic changes without acute abnormality.   Electronically Signed   By: Inez Catalina M.D.   On: 11/08/2013 07:54   Ct Chest Wo Contrast  11/08/2013   CLINICAL DATA:  Cough and hypotension  EXAM: CT CHEST WITHOUT CONTRAST  TECHNIQUE: Multidetector CT imaging of the chest was performed following the standard protocol without IV contrast.  COMPARISON:  11/08/2013, 11/29/2012, 04/29/2012 and 10/15/2009  FINDINGS: The lungs are again well aerated bilaterally. Diffuse fibrotic changes are again seen with some associated calcifications most consistent with prior granulomatous disease. No focal confluent infiltrate is  identified. The overall appearance is similar to that seen on prior CT examinations. No sizable effusion is seen.  The hilar and mediastinal structures show no significant lymphadenopathy. Heavy coronary calcifications are noted. Aortic calcifications without aneurysmal dilatation are seen. The visualized upper abdomen is within normal limits. The osseous structures show degenerative change of the lumbar spine as well as prior right rib fractures. .  IMPRESSION: Chronic changes in both lungs most consistent with prior granulomatous disease. The overall appearance is stable from previous CTs dating back to 2011.  No acute infiltrate is seen.  Chronic changes without acute abnormality.   Electronically Signed   By: Inez Catalina M.D.   On: 11/08/2013 08:02   Dg Chest Port 1 View  11/08/2013  CLINICAL DATA:  Cough, altered mental status, hypotension  EXAM: PORTABLE CHEST - 1 VIEW  COMPARISON:  Prior radiograph from 11/29/2012  FINDINGS: Cardiomegaly is stable as compared to prior study. Atherosclerotic calcifications present within the aortic arch.  Lungs are mildly hypoinflated with fibrotic lung changes again seen. Superimposed irregular interstitial opacities are seen within the right mid and lower lung, suspicious for possible superimposed infection. Additional increased interstitial opacity seen within the left lung base as well. No pulmonary edema or definite pleural effusion. No pneumothorax.  No acute osseous abnormality. Prominent degenerative changes noted at the right shoulder.  IMPRESSION: 1. Chronic fibrotic lung changes with increased interstitial opacities within the right mid lung and bilateral lung bases. While findings may reflect progressive fibrosis, possible superimposed infection could be considered in the correct clinical setting. 2. Stable cardiomegaly.   Electronically Signed   By: Jeannine Boga M.D.   On: 11/08/2013 05:48    Review of Systems  All other systems reviewed and are  negative.  Blood pressure 103/62, pulse 91, temperature 96.8 F (36 C), temperature source Rectal, resp. rate 16, SpO2 96.00%. Physical Exam On examination patient has venous stasis swelling in both lower extremities there is multiple ulcerations and there is induration and redness of possible cellulitis with the venous stasis insufficiency in both legs. Multiple abrasions bruising and traumatic venous stasis ulcers in both legs.  Examination of the right knee there is no tenderness to palpation around the knee there is no effusion patient has no pain with weightbearing or with range of motion. Assessment/Plan: Assessment right total knee arthroplasty with no signs of infection. Venous stasis insufficiency with venous stasis ulcers traumatic wounds and cellulitis both legs.  Plan: I will have nursing wrap the legs with an Unna compressive wrap and an Unna paste and Coban. Recommend IV antibiotics for the cellulitis in his legs. All followup in the office one week after discharge.  Meridee Score V 11/08/2013, 10:37 AM

## 2013-11-08 NOTE — ED Provider Notes (Signed)
CSN: [id number]     Arrival date & time 11/08/13  [id number] History   First MD Initiated Contact with Patient 11/08/13 0501     Chief Complaint  Patient presents with  . Altered Mental Status     (Consider location/radiation/quality/duration/timing/severity/associated sxs/prior Treatment) HPI 80 year old male presents to emergency department via EMS from his nursing facility with reported fever, weakness and altered mental status.  Patient is currently at the facility rehabilitation after total knee replacement on the right.  Reportedly he is normally alert and oriented x3.  Patient found here to be hypothermic, hypotensive and confused.  Patient reports to me that he is here because he is having problems with his alcohol problem and has messed up his personal life.  He reports he is currently living at home.  He denies any fever chills nausea vomiting or diarrhea.  He is not a reliable historian. Past Medical History  Diagnosis Date  . Glaucoma     uses Eye Drops daily  . Dyslipidemia     statin intolerant  . Gout     takes Colchicine and Uloric daily  . BPH (benign prostatic hyperplasia)     [facility]  . History of peptic ulcer disease   . Peptic ulcer disease   . Diverticulosis   . Lumbar spinal stenosis     Most recent MRI 12/ 2011; [facility] - Dr. Nelva Bush  . Carotid atherosclerosis     Dopplers 11/09 showing 50-69% stenosis  . Peripheral arterial disease     [place] [facility] consult 1/08  . Epigastric pain     Hospitalization 7/11 [place]  . Arthritis     [facility]ed by [facility]  . Myeloma     IgA related; Dr. Marin Olp - 2009  . Headache(784.0)     rarely  . Constipation     but doesn't take any meds  . Urinary frequency   . Anxiety     takes Paxil daily  . Insomnia     takes Ambien nightly   . Anemia     of chronic disease;takes Ferrous Gluconate daily  . Osteomyelitis   . Hypertension     takes Lisinopril daily  . Peripheral edema     takes  Furosemide daily  . Pneumonia     hx of-about 31yrs ago  . Peripheral neuropathy   . Joint pain   . Joint swelling   . Chronic back pain     spinal stenosis  . Chronic kidney disease     not on dialysis  . Hypothyroidism     takes Synthroid daily  . GERD (gastroesophageal reflux disease)     takes Protonix daily  . Depression   . Gout   . Diabetes mellitus     takes glipizide daily, Type 2  . Cataract   . Substance abuse     alcohol   Past Surgical History  Procedure Laterality Date  . Abdominal aortic aneurysm repair  03/1998  . Abdominal hernia repair  2000  . Renal stone surgery  1999  . Nerve root block      Multiple for lumbar spine  . Colonoscopy  07/22/2004    due repeat 2011; Dr. Teena Irani, [facility]  . Eye surgery      bil cataract surgery  . Amputation Left 11/29/2012    Procedure: Left Foot, Great Toe Amputation at MTP Joint;  Surgeon: Newt Minion, MD;  Location: [place];  Service: Orthopedics;  Laterality: Left;  LEFT  Foot, Great Toe Amputation at MTP Joint  . Amputation Left 01/03/2013    Procedure: Left foot 2nd toe amputation at metatarsophalangeal;  Surgeon: Newt Minion, MD;  Location: [place];  Service: Orthopedics;  Laterality: Left;  . Tonsillectomy    . Vasectomy    . Amputation Left 03/19/2013    Procedure: AMPUTATION DIGIT;  Surgeon: Newt Minion, MD;  Location: [place];  Service: Orthopedics;  Laterality: Left;  Left foot Amputation 3, 4, 5th Toes at MTP joint  . Esophagogastroduodenoscopy    . Total knee arthroplasty Right 10/15/2013    Procedure: TOTAL KNEE ARTHROPLASTY;  Surgeon: Newt Minion, MD;  Location: [place];  Service: Orthopedics;  Laterality: Right;  Right Total Knee Arthroplasty  . Hernia repair N/A 2000    abdominal   Family History  Problem Relation Age of Onset  . Diabetes Father   . Glaucoma Father   . Cancer Brother     1 died of lung cancer, 1 of colon, 1 of different cancer  . Glaucoma Daughter   . Glaucoma Son     History  Substance Use Topics  . Smoking status: Former Research scientist (life sciences)  . Smokeless tobacco: Never Used     Comment: quit smoking 72yrs ago  . Alcohol Use: Yes     Comment: 4 oz drink daily    Review of Systems  Unable to perform ROS: Mental status change      Allergies  Morphine and related; Statins; and Allopurinol  Home Medications   Prior to Admission medications   Medication Sig Start Date End Date Taking? Authorizing Provider  acetaminophen (TYLENOL) 500 MG tablet Take 1 tablet (500 mg total) by mouth every 6 (six) hours as needed for mild pain. 10/17/13  Yes Newt Minion, MD  aspirin EC 81 MG tablet Take 1 tablet (81 mg total) by mouth daily. 10/17/13  Yes Newt Minion, MD  cholecalciferol (VITAMIN D) 1000 UNITS tablet Take 1,000 Units by mouth daily.   Yes Historical Provider, MD  colchicine (COLCRYS) 0.6 MG tablet Take 0.6 mg by mouth daily.    Yes Historical Provider, MD  dorzolamide-timolol (COSOPT) 22.3-6.8 MG/ML ophthalmic solution Place 1 drop into both eyes 2 (two) times daily.    Yes Historical Provider, MD  doxycycline (VIBRA-TABS) 100 MG tablet Take 100 mg by mouth 2 (two) times daily. x30 days, starting on 10/22/13   Yes Historical Provider, MD  febuxostat (ULORIC) 40 MG tablet Take 40 mg by mouth daily.   Yes Historical Provider, MD  ferrous gluconate (FERGON) 325 MG tablet Take 325 mg by mouth daily with breakfast.     Yes Historical Provider, MD  furosemide (LASIX) 40 MG tablet Take 1 tablet (40 mg total) by mouth daily. 12/25/12  Yes Denita Lung, MD  glipiZIDE (GLUCOTROL) 10 MG tablet Take 5 mg by mouth daily before breakfast.   Yes Historical Provider, MD  HYDROcodone-acetaminophen (NORCO/VICODIN) 5-325 MG per tablet Take 1 tablet by mouth every 6 (six) hours as needed for moderate pain.   Yes Historical Provider, MD  hydrOXYzine (ATARAX/VISTARIL) 25 MG tablet Take 25 mg by mouth 3 (three) times daily.   Yes Historical Provider, MD  latanoprost (XALATAN) 0.005 %  ophthalmic solution Place 1 drop into the right eye at bedtime.     Yes Historical Provider, MD  levothyroxine (SYNTHROID, LEVOTHROID) 50 MCG tablet Take 50 mcg by mouth daily before breakfast.   Yes Historical Provider, MD  lisinopril (PRINIVIL,ZESTRIL) 10  MG tablet Take 1 tablet (10 mg total) by mouth daily. 06/23/13  Yes Denita Lung, MD  moxifloxacin (AVELOX) 400 MG tablet Take 400 mg by mouth daily at 8 pm.   Yes Historical Provider, MD  Multiple Vitamins-Minerals (MULTIVITAMIN WITH MINERALS) tablet Take 1 tablet by mouth daily.   Yes Historical Provider, MD  nortriptyline (PAMELOR) 50 MG capsule Take 1 capsule (50 mg total) by mouth at bedtime. 09/02/13  Yes Denita Lung, MD  Omega-3 Fatty Acids (FISH OIL) 1000 MG CAPS Take 1,000 mg by mouth daily.   Yes Historical Provider, MD  pantoprazole (PROTONIX) 20 MG tablet Take 1 tablet (20 mg total) by mouth daily. 05/07/13  Yes Blanchard Kelch, MD  PARoxetine (PAXIL) 40 MG tablet Take 40 mg by mouth every morning.   Yes Historical Provider, MD  saccharomyces boulardii (FLORASTOR) 250 MG capsule Take 250 mg by mouth daily.   Yes Historical Provider, MD  zolpidem (AMBIEN) 5 MG tablet Take 1 tablet (5 mg total) by mouth at bedtime as needed for sleep. 07/25/13   Denita Lung, MD   BP 106/35  Pulse 87  Temp(Src) 96.8 F (36 C) (Rectal)  Resp 19  SpO2 98% Physical Exam  Nursing note and vitals reviewed. Constitutional: He appears well-developed and well-nourished. No distress.  HENT:  Head: Normocephalic and atraumatic.  Right Ear: External ear normal.  Left Ear: External ear normal.  Nose: Nose normal.  Mouth/Throat: Oropharynx is clear and moist.  Eyes: Conjunctivae and EOM are normal. Pupils are equal, round, and reactive to light.  Neck: Normal range of motion. Neck supple. No JVD present. No tracheal deviation present. No thyromegaly present.  Cardiovascular: Normal rate, regular rhythm, normal heart sounds and intact distal  pulses.  Exam reveals no gallop and no friction rub.   No murmur heard. Hypotension noted  Pulmonary/Chest: Effort normal and breath sounds normal. No stridor. No respiratory distress. He has no wheezes. He has no rales. He exhibits no tenderness.  Patient has coarse breath sounds bilaterally but worse on the left.  He has a wet sounding cough.  He is in no significant respiratory distress.  Abdominal: Soft. Bowel sounds are normal. He exhibits no distension and no mass. There is no tenderness. There is no rebound and no guarding.  Musculoskeletal: He exhibits edema. He exhibits no tenderness.  Patient has significant edema of lower extremities left greater than right.  He has dressings covering skin tears but do not appear to be infected on the left.  His surgical incision on the right is clean dry and intact.  There is no warmth crepitus or pain with movement of his knee.  Amputations of toes   Lymphadenopathy:    He has no cervical adenopathy.  Neurological: He is alert. No cranial nerve deficit. He exhibits normal muscle tone. Coordination normal.  Patient is confused.  He reports he is at [facility], but is unsure why.  He knows the year.  He has a waxing and waning sensorium.  Skin: Skin is warm and dry. No rash noted. No erythema. No pallor.  Psychiatric: He has a normal mood and affect. His behavior is normal. Judgment and thought content normal.    ED Course  Procedures (including critical care time) Labs Review Labs Reviewed  CBC WITH DIFFERENTIAL - Abnormal; Notable for the following:    RBC 2.22 (*)    Hemoglobin 7.2 (*)    HCT 21.3 (*)    Monocytes Relative 18 (*)  Monocytes Absolute 1.7 (*)    Eosinophils Relative 9 (*)    Eosinophils Absolute 0.8 (*)    All other components within normal limits  COMPREHENSIVE METABOLIC PANEL - Abnormal; Notable for the following:    Sodium 135 (*)    CO2 16 (*)    BUN 85 (*)    Creatinine, Ser 3.88 (*)    Albumin 2.8 (*)     GFR calc non Af [name] 13 (*)    GFR calc Af [name] 15 (*)    All other components within normal limits  URINALYSIS, ROUTINE W REFLEX MICROSCOPIC - Abnormal; Notable for the following:    APPearance CLOUDY (*)    Protein, ur 30 (*)    Leukocytes, UA SMALL (*)    All other components within normal limits  URINE MICROSCOPIC-ADD ON - Abnormal; Notable for the following:    Casts HYALINE CASTS (*)    All other components within normal limits  CULTURE, BLOOD (ROUTINE X 2)  CULTURE, BLOOD (ROUTINE X 2)  URINE CULTURE  I-STAT CG4 LACTIC ACID, ED    Imaging Review Dg Chest Port 1 View  11/08/2013   CLINICAL DATA:  Cough, altered mental status, hypotension  EXAM: PORTABLE CHEST - 1 VIEW  COMPARISON:  Prior radiograph from 11/29/2012  FINDINGS: Cardiomegaly is stable as compared to prior study. Atherosclerotic calcifications present within the aortic arch.  Lungs are mildly hypoinflated with fibrotic lung changes again seen. Superimposed irregular interstitial opacities are seen within the right mid and lower lung, suspicious for possible superimposed infection. Additional increased interstitial opacity seen within the left lung base as well. No pulmonary edema or definite pleural effusion. No pneumothorax.  No acute osseous abnormality. Prominent degenerative changes noted at the right shoulder.  IMPRESSION: 1. Chronic fibrotic lung changes with increased interstitial opacities within the right mid lung and bilateral lung bases. While findings may reflect progressive fibrosis, possible superimposed infection could be considered in the correct clinical setting. 2. Stable cardiomegaly.   Electronically Signed   By: Jeannine Boga M.D.   On: 11/08/2013 05:48     EKG Interpretation None      MDM   Final diagnoses:  Delirium  HCAP (healthcare-associated pneumonia)  Hypotension    80 year old male with altered mental status, hypothermia and hypotension.  He has worsening of his chronic renal  function.  He may have a pneumonia on his chest x-ray although difficult given his underlying fibrosis.  Patient has responded to fluid boluses, with improvement in his blood pressure.  His lactate is normal.  Plan for head CT given his confusion.  Discuss with hospitalist who requests noncontrast CT scan of his chest while he is having a head CT done.  Plan is to admit, with antibiotics covering for healthcare associated pneumonia.    Kalman Drape, MD 11/08/13 [phone]

## 2013-11-08 NOTE — ED Notes (Signed)
Zachary Vargas. Ortho Tech.aware and will come down to apply boot.

## 2013-11-08 NOTE — Progress Notes (Signed)
Orthopedic Tech Progress Note Patient Details:  Zachary Vargas 1928-03-14 [id number]  Ortho Devices Type of Ortho Device: Louretta Parma boot Ortho Device/Splint Location: bilateral unna boots Ortho Device/Splint Interventions: Application   [name] Cammer 11/08/2013, 11:52 AM

## 2013-11-08 NOTE — ED Notes (Addendum)
Pt dentures taken out and placed in cup and inside of pt belonging bags.

## 2013-11-08 NOTE — ED Notes (Signed)
Patient with fever, weakness and change in mental status.  Patient is from [facility], patient was not his usual baseline.  Patient is usually CAOx3 per staff.

## 2013-11-09 LAB — BASIC METABOLIC PANEL
BUN: 71 mg/dL — ABNORMAL HIGH (ref 6–23)
CHLORIDE: 107 meq/L (ref 96–112)
CO2: 13 mEq/L — ABNORMAL LOW (ref 19–32)
Calcium: 8.9 mg/dL (ref 8.4–10.5)
Creatinine, Ser: 2.63 mg/dL — ABNORMAL HIGH (ref 0.50–1.35)
GFR, EST AFRICAN AMERICAN: 24 mL/min — AB (ref >90)
GFR, EST NON AFRICAN AMERICAN: 21 mL/min — AB (ref >90)
Glucose, Bld: 81 mg/dL (ref 70–99)
POTASSIUM: 5 meq/L (ref 3.7–5.3)
Sodium: 135 mEq/L — ABNORMAL LOW (ref 137–147)

## 2013-11-09 LAB — URINE CULTURE: Colony Count: 2000

## 2013-11-09 LAB — CBC
HEMATOCRIT: 24.5 % — AB (ref 39.0–52.0)
Hemoglobin: 8.2 g/dL — ABNORMAL LOW (ref 13.0–17.0)
MCH: 31.2 pg (ref 26.0–34.0)
MCHC: 33.5 g/dL (ref 30.0–36.0)
MCV: 93.2 fL (ref 78.0–100.0)
Platelets: 217 10*3/uL (ref 150–400)
RBC: 2.63 MIL/uL — ABNORMAL LOW (ref 4.22–5.81)
RDW: 15.4 % (ref 11.5–15.5)
WBC: 5.4 10*3/uL (ref 4.0–10.5)

## 2013-11-09 LAB — TYPE AND SCREEN
ABO/RH(D): O POS
Antibody Screen: NEGATIVE
UNIT DIVISION: 0
Unit division: 0

## 2013-11-09 LAB — GLUCOSE, CAPILLARY
GLUCOSE-CAPILLARY: 76 mg/dL (ref 70–99)
Glucose-Capillary: 107 mg/dL — ABNORMAL HIGH (ref 70–99)
Glucose-Capillary: 168 mg/dL — ABNORMAL HIGH (ref 70–99)
Glucose-Capillary: 109 mg/dL — ABNORMAL HIGH (ref 70–99)
Glucose-Capillary: 126 mg/dL — ABNORMAL HIGH (ref 70–99)

## 2013-11-09 LAB — LACTIC ACID, PLASMA: Lactic Acid, Venous: 0.5 mmol/L (ref 0.5–2.2)

## 2013-11-09 MED ORDER — SODIUM BICARBONATE 8.4 % IV SOLN
INTRAVENOUS | Status: DC
  Administered 2013-11-09 – 2013-11-11 (×4): via INTRAVENOUS
  Filled 2013-11-09 (×7): qty 1000

## 2013-11-09 MED ORDER — IPRATROPIUM-ALBUTEROL 0.5-2.5 (3) MG/3ML IN SOLN
3.0000 mL | Freq: Four times a day (QID) | RESPIRATORY_TRACT | Status: DC
  Administered 2013-11-09 – 2013-11-10 (×6): 3 mL via RESPIRATORY_TRACT
  Filled 2013-11-09 (×7): qty 3

## 2013-11-09 MED ORDER — PIPERACILLIN-TAZOBACTAM 3.375 G IVPB
3.3750 g | Freq: Three times a day (TID) | INTRAVENOUS | Status: DC
  Administered 2013-11-09 – 2013-11-13 (×13): 3.375 g via INTRAVENOUS
  Filled 2013-11-09 (×16): qty 50

## 2013-11-09 NOTE — Evaluation (Signed)
Physical Therapy Evaluation Patient Details Name: Zachary Vargas MRN: [id number] DOB: May 31, 1928 Today's Date: 11/09/2013   History of Present Illness  80 y/o male with PMH of GERD,  recent right total knee replacement undergoing rehab at SNF, recently diagnosed with PNA, admitted with acute suspected toxic metabolic encephalopathy from worsening renal failure, underlying sepsis.   Clinical Impression   Pt admitted with above. Pt currently with functional limitations due to the deficits listed below (see PT Problem List).  Pt will benefit from skilled PT to increase their independence and safety with mobility to allow discharge to the venue listed below.       Follow Up Recommendations SNF    Equipment Recommendations  Rolling walker with 5" wheels;3in1 (PT)    Recommendations for Other Services       Precautions / Restrictions Precautions Precautions: Fall;Knee Precaution Comments: h/o multiple falls at home Restrictions RLE Weight Bearing: Weight bearing as tolerated      Mobility  Bed Mobility Overal bed mobility: Needs Assistance Bed Mobility: Supine to Sit     Supine to sit: Mod assist     General bed mobility comments: cues for technique.  (A) for LE management, to elevate shoulders/trunk to sitting upright  Transfers Overall transfer level: Needs assistance Equipment used: Rolling walker (2 wheeled) Transfers: Sit to/from Stand Sit to Stand: +2 physical assistance;Mod assist Stand pivot transfers: +2 physical assistance;Mod assist       General transfer comment: requiring physical anti-gravity assist for successful sit to stand; Heavy dependence on UE push and momentum  Ambulation/Gait Ambulation/Gait assistance: +2 physical assistance;Mod assist Ambulation Distance (Feet): 3 Feet (pivot steps bed to [facility] to recliner) Assistive device: Rolling walker (2 wheeled) Gait Pattern/deviations: Shuffle;Trunk flexed     General Gait Details: difficulty shifting  weight onto R foot to [employer] L foot for stepping  Stairs            Wheelchair Mobility    Modified Rankin (Stroke Patients Only)       Balance             Standing balance-Leahy Scale: Poor Standing balance comment: Dependent on UE support                             Pertinent Vitals/Pain Nospecific reports of pain;  VSS    Home Living Family/patient expects to be discharged to:: Skilled nursing facility                      Prior Function Level of Independence: Independent         Comments: independent, but significant h/o falls     Hand Dominance        Extremity/Trunk Assessment   Upper Extremity Assessment: Generalized weakness           Lower Extremity Assessment: Generalized weakness RLE Deficits / Details: Incision healing well; strength at least 3/5 LLE Deficits / Details: All toes amputated     Communication   Communication: HOH  Cognition Arousal/Alertness: Awake/alert Behavior During Therapy: WFL for tasks assessed/performed Overall Cognitive Status: Within Functional Limits for tasks assessed                      General Comments      Exercises        Assessment/Plan    PT Assessment Patient needs continued PT services  PT Diagnosis Difficulty walking;Abnormality of gait;Generalized weakness;Acute  pain   PT Problem List Decreased strength;Decreased activity tolerance;Decreased range of motion;Decreased balance;Decreased mobility;Decreased knowledge of use of DME;Decreased knowledge of precautions;Decreased safety awareness;Obesity;Pain  PT Treatment Interventions DME instruction;Gait training;Functional mobility training;Therapeutic activities;Therapeutic exercise;Balance training;Neuromuscular re-education;Patient/family education;Manual techniques;Modalities   PT Goals (Current goals can be found in the Care Plan section) Acute Rehab PT Goals Patient Stated Goal: Wants to get better PT  Goal Formulation: With patient Time For Goal Achievement: 11/23/13 Potential to Achieve Goals: Fair    Frequency Min 3X/week   Barriers to discharge        Co-evaluation               End of Session                 Time: 1435-1500 PT Time Calculation (min): 25 min   Charges:   PT Evaluation $Initial PT Evaluation Tier I: 1 Procedure PT Treatments $Gait Training: 8-22 mins $Therapeutic Activity: 8-22 mins   PT G Codes:          [facility] 11/09/2013, 4:59 PM  Roney Marion, PT  Acute Rehabilitation Services Pager [phone] Office [phone]

## 2013-11-09 NOTE — Progress Notes (Signed)
PATIENT DETAILS Name: Zachary Vargas Age: 80 y.o. Sex: male Date of Birth: 1927-09-01 Admit Date: 11/08/2013 Admitting Physician Evalee Mutton Kristeen Mans, MD [id number] CHARLES, MD  Subjective: Much more awake and alert this am.  Assessment/Plan: Principal Problem:   Encephalopathy acute -Suspected toxic metabolic encephalopathy from worsening renal failure, and underlying sepsis. CT head negative, treated with IV fluids, IV antibiotics. Significantly improved this am.  Active Problems: Sepsis  - Presented with hypothermia and hypotension, no obvious source of infection evident-chest x-ray/CT chest negative for pneumonia, UA not consistent with UTI. Suspect that cellulitis in bilateral lower extremities was the culprit. He however does have chronic skin changes.Started on Empiric Vanco/Zosyn on 5/30, much improved, BP now stable. -Blood and Urine cultures on 5/30 still pending.  Acute-on-chronic renal failure  - Likely secondary to sepsis, and use of Lasix, and lisinopril.  - Held Lasix, lisinopril, colchicine, hydrated with IVF, creatinine now downtrending, follow clinical course. -Since improving will hold off on further work up  Metabolic Acidosis -likely secondary to Renal failure.-Lactic acid normal. Abd is soft and non tender -change IVF to D5 with Bicarb, and follow  Anemia, unspecified  - Suspect anemia secondary to recent blood loss post right knee replacement.Since patient claimed to be very fatigued and tired,  transfused 2 units of PRBC on 5/30. Hb stable at 8.2 post transfusion  - No obvious GI loss evident, continue iron supplementation.   Recent Total Right Knee Replacement -appreciate Ortho input, no signs of knee infection, Unna compressive wrap applied on 5/30  Chronic gouty arthropathy with tophus (tophi)  - Continue Uloric, hold Colchicine for now- if an acute flare occurs-start prednisone   . Depression  - Cautiously continue with Celexa and  nortriptyline.   . Hypothyroidism  - Continue levothyroxine   . Type II or unspecified type diabetes mellitus without mention of complication, not stated as uncontrolled  -Oral hypoglycemic agents on hold, CBG's controlled SSI.  Disposition: Remain inpatient  DVT Prophylaxis: Prophylactic Heparin   Code Status: Full code  Family Communication -spouse-Faye Cannaday -[id number]  Procedures:  None  CONSULTS:  orthopedic surgery  Time spent 40 minutes-which includes 50% of the time with face-to-face with patient/ family and coordinating care related to the above assessment and plan.    MEDICATIONS: Scheduled Meds: . aspirin EC  81 mg Oral Daily  . cholecalciferol  1,000 Units Oral Daily  . dorzolamide-timolol  1 drop Both Eyes BID  . febuxostat  40 mg Oral Daily  . ferrous gluconate  325 mg Oral Q breakfast  . heparin  5,000 Units Subcutaneous 3 times per day  . insulin aspart  0-9 Units Subcutaneous TID WC  . ipratropium-albuterol  3 mL Nebulization Q6H  . latanoprost  1 drop Right Eye QHS  . levothyroxine  50 mcg Oral QAC breakfast  . multivitamin with minerals  1 tablet Oral Daily  . nortriptyline  50 mg Oral QHS  . omega-3 acid ethyl esters  1 g Oral Daily  . pantoprazole  40 mg Oral Daily  . PARoxetine  40 mg Oral BH-[id number]  . piperacillin-tazobactam (ZOSYN)  IV  3.375 g Intravenous 3 times per day  . saccharomyces boulardii  250 mg Oral Daily  . sodium chloride  3 mL Intravenous Q12H  . vancomycin  1,500 mg Intravenous Q48H   Continuous Infusions: . dextrose 5 % 1,000 mL with sodium bicarbonate 100 mEq infusion 75 mL/hr at 11/09/13 0813   PRN Meds:.acetaminophen,  acetaminophen, albuterol, guaiFENesin-dextromethorphan, HYDROcodone-acetaminophen, ondansetron (ZOFRAN) IV, ondansetron  Antibiotics: Anti-infectives   Start     Dose/Rate Route Frequency Ordered Stop   11/09/13 1400  piperacillin-tazobactam (ZOSYN) IVPB 3.375 g     3.375 g 12.5 mL/hr over 240  Minutes Intravenous 3 times per day 11/09/13 0728     11/08/13 1600  vancomycin (VANCOCIN) 1,500 mg in sodium chloride 0.9 % 500 mL IVPB     1,500 mg 250 mL/hr over 120 Minutes Intravenous Every 48 hours 11/08/13 1548     11/08/13 1600  piperacillin-tazobactam (ZOSYN) IVPB 2.25 g  Status:  Discontinued     2.25 g 100 mL/hr over 30 Minutes Intravenous 3 times per day 11/08/13 1548 11/09/13 0728   11/08/13 0715  vancomycin (VANCOCIN) injection 1 g  Status:  Discontinued     1 g Intravenous  Once 11/08/13 0702 11/08/13 0703   11/08/13 0715  piperacillin-tazobactam (ZOSYN) IVPB 3.375 g     3.375 g 100 mL/hr over 30 Minutes Intravenous  Once 11/08/13 0702 11/08/13 0841   11/08/13 0715  vancomycin (VANCOCIN) IVPB 1000 mg/200 mL premix     1,000 mg 200 mL/hr over 60 Minutes Intravenous  Once 11/08/13 0703 11/08/13 0910       PHYSICAL EXAM: Vital signs in last 24 hours: Filed Vitals:   11/09/13 0243 11/09/13 0400 11/09/13 0500 11/09/13 0800  BP: 123/48 117/51 115/48 118/53  Pulse: 84 85 86 91  Temp: 97.2 F (36.2 C)  98.5 F (36.9 C)   TempSrc: Oral  Axillary   Resp: 19 13 14 20   Height:      Weight:      SpO2: 97% 97% 96% 99%    Weight change:  Filed Weights   11/08/13 1611  Weight: 120.1 kg (264 lb 12.4 oz)   Body mass index is 33.09 kg/(m^2).   Gen Exam: Awake and alert with clear speech.   Neck: Supple, No JVD.   Chest: B/L good air entry +ve, rhonchi scattered CVS: S1 S2 Regular, no murmurs.  Abdomen: soft, BS +, non tender, non distended.  Extremities: b/l compressive unna wraps in place Neurologic: Non Focal.   Skin: No Rash.   Wounds: N/A.   Intake/Output from previous day:  Intake/Output Summary (Last 24 hours) at 11/09/13 0857 Last data filed at 11/09/13 0700  Gross per 24 hour  Intake   1764 ml  Output   1175 ml  Net    589 ml     LAB RESULTS: CBC  Recent Labs Lab 11/08/13 0520 11/08/13 1700 11/09/13 0350  WBC 9.6 6.3 5.4  HGB 7.2* 7.4* 8.2*    HCT 21.3* 22.4* 24.5*  PLT 257 244 217  MCV 95.9 95.3 93.2  MCH 32.4 31.5 31.2  MCHC 33.8 33.0 33.5  RDW 14.4 14.5 15.4  LYMPHSABS 1.7  --   --   MONOABS 1.7*  --   --   EOSABS 0.8*  --   --   BASOSABS 0.0  --   --     Chemistries   Recent Labs Lab 11/08/13 0520 11/08/13 1700 11/09/13 0350  NA 135*  --  135*  K 5.2  --  5.0  CL 101  --  107  CO2 16*  --  13*  GLUCOSE 81  --  81  BUN 85*  --  71*  CREATININE 3.88* 3.11* 2.63*  CALCIUM 9.3  --  8.9    CBG:  Recent Labs Lab 11/08/13 1704  GLUCAP 83  GFR Estimated Creatinine Clearance: 29.7 ml/min (by C-G formula based on Cr of 2.63).  Coagulation profile No results found for this basename: INR, PROTIME,  in the last 168 hours  Cardiac Enzymes No results found for this basename: CK, CKMB, TROPONINI, MYOGLOBIN,  in the last 168 hours  No components found with this basename: POCBNP,  No results found for this basename: DDIMER,  in the last 72 hours No results found for this basename: HGBA1C,  in the last 72 hours No results found for this basename: CHOL, HDL, LDLCALC, TRIG, CHOLHDL, LDLDIRECT,  in the last 72 hours No results found for this basename: TSH, T4TOTAL, FREET3, T3FREE, THYROIDAB,  in the last 72 hours No results found for this basename: VITAMINB12, FOLATE, FERRITIN, TIBC, IRON, RETICCTPCT,  in the last 72 hours No results found for this basename: LIPASE, AMYLASE,  in the last 72 hours  Urine Studies No results found for this basename: UACOL, UAPR, USPG, UPH, UTP, UGL, UKET, UBIL, UHGB, UNIT, UROB, ULEU, UEPI, UWBC, URBC, UBAC, CAST, CRYS, UCOM, BILUA,  in the last 72 hours  MICROBIOLOGY: Recent Results (from the past 240 hour(s))  MRSA PCR SCREENING     Status: None   Collection Time    11/08/13  4:49 PM      Result Value Ref Range Status   MRSA by PCR NEGATIVE  NEGATIVE Final   Comment:            The GeneXpert MRSA Assay (FDA     approved for NASAL specimens     only), is one component of  a     comprehensive MRSA colonization     surveillance program. It is not     intended to diagnose MRSA     infection nor to guide or     monitor treatment for     MRSA infections.    RADIOLOGY STUDIES/RESULTS: Ct Head Wo Contrast  11/08/2013   CLINICAL DATA:  Altered mental status  EXAM: CT HEAD WITHOUT CONTRAST  TECHNIQUE: Contiguous axial images were obtained from the base of the skull through the vertex without intravenous contrast.  COMPARISON:  None.  FINDINGS: The bony calvarium is intact. No gross soft tissue abnormality is noted. Atrophic changes and chronic white matter ischemic change is identified. No findings to suggest acute hemorrhage, acute infarction or space-occupying mass lesion are noted.  IMPRESSION: Chronic changes without acute abnormality.   Electronically Signed   By: Inez Catalina M.D.   On: 11/08/2013 07:54   Ct Chest Wo Contrast  11/08/2013   CLINICAL DATA:  Cough and hypotension  EXAM: CT CHEST WITHOUT CONTRAST  TECHNIQUE: Multidetector CT imaging of the chest was performed following the standard protocol without IV contrast.  COMPARISON:  11/08/2013, 11/29/2012, 04/29/2012 and 10/15/2009  FINDINGS: The lungs are again well aerated bilaterally. Diffuse fibrotic changes are again seen with some associated calcifications most consistent with prior granulomatous disease. No focal confluent infiltrate is identified. The overall appearance is similar to that seen on prior CT examinations. No sizable effusion is seen.  The hilar and mediastinal structures show no significant lymphadenopathy. Heavy coronary calcifications are noted. Aortic calcifications without aneurysmal dilatation are seen. The visualized upper abdomen is within normal limits. The osseous structures show degenerative change of the lumbar spine as well as prior right rib fractures. .  IMPRESSION: Chronic changes in both lungs most consistent with prior granulomatous disease. The overall appearance is stable from  previous CTs dating back to 2011.  No  acute infiltrate is seen.  Chronic changes without acute abnormality.   Electronically Signed   By: Inez Catalina M.D.   On: 11/08/2013 08:02   Dg Chest Port 1 View  11/08/2013   CLINICAL DATA:  Cough, altered mental status, hypotension  EXAM: PORTABLE CHEST - 1 VIEW  COMPARISON:  Prior radiograph from 11/29/2012  FINDINGS: Cardiomegaly is stable as compared to prior study. Atherosclerotic calcifications present within the aortic arch.  Lungs are mildly hypoinflated with fibrotic lung changes again seen. Superimposed irregular interstitial opacities are seen within the right mid and lower lung, suspicious for possible superimposed infection. Additional increased interstitial opacity seen within the left lung base as well. No pulmonary edema or definite pleural effusion. No pneumothorax.  No acute osseous abnormality. Prominent degenerative changes noted at the right shoulder.  IMPRESSION: 1. Chronic fibrotic lung changes with increased interstitial opacities within the right mid lung and bilateral lung bases. While findings may reflect progressive fibrosis, possible superimposed infection could be considered in the correct clinical setting. 2. Stable cardiomegaly.   Electronically Signed   By: Jeannine Boga M.D.   On: 11/08/2013 05:48    Lavene Penagos Kristeen Mans, MD  [facility] Pager:[phone]  If 7PM-7AM, please contact night-coverage [email or web address] Password [email or web address] 11/09/2013, 8:57 AM   LOS: 1 day   **Disclaimer: This note may have been dictated with voice recognition software. Similar sounding words can inadvertently be transcribed and this note may contain transcription errors which may not have been corrected upon publication of note.**

## 2013-11-09 NOTE — Evaluation (Signed)
Clinical/Bedside Swallow Evaluation Patient Details  Name: Zachary Vargas MRN: [id number] Date of Birth: 11-Mar-1928  Today's Date: 11/09/2013 Time: 1233-1250 SLP Time Calculation (min): 17 min  Past Medical History:  Past Medical History  Diagnosis Date  . Glaucoma     uses Eye Drops daily  . Dyslipidemia     statin intolerant  . Gout     takes Colchicine and Uloric daily  . BPH (benign prostatic hyperplasia)     [facility]  . History of peptic ulcer disease   . Peptic ulcer disease   . Diverticulosis   . Lumbar spinal stenosis     Most recent MRI 12/ 2011; [facility] - Dr. Nelva Bush  . Carotid atherosclerosis     Dopplers 11/09 showing 50-69% stenosis  . Peripheral arterial disease     [facility] care consult 1/08  . Epigastric pain     Hospitalization 7/11 [place]  . Arthritis     Followed by [facility]  . Myeloma     IgA related; Dr. Marin Olp - 2009  . Headache(784.0)     rarely  . Constipation     but doesn't take any meds  . Urinary frequency   . Anxiety     takes Paxil daily  . Insomnia     takes Ambien nightly   . Anemia     of chronic disease;takes Ferrous Gluconate daily  . Osteomyelitis   . Hypertension     takes Lisinopril daily  . Peripheral edema     takes Furosemide daily  . Pneumonia     hx of-about 24yrs ago  . Peripheral neuropathy   . Joint pain   . Joint swelling   . Chronic back pain     spinal stenosis  . Chronic kidney disease     not on dialysis  . Hypothyroidism     takes Synthroid daily  . GERD (gastroesophageal reflux disease)     takes Protonix daily  . Depression   . Gout   . Diabetes mellitus     takes glipizide daily, Type 2  . Cataract   . Substance abuse     alcohol   Past Surgical History:  Past Surgical History  Procedure Laterality Date  . Abdominal aortic aneurysm repair  03/1998  . Abdominal hernia repair  2000  . Renal stone surgery  1999  . Nerve root block      Multiple for lumbar spine  .  Colonoscopy  07/22/2004    due repeat 2011; Dr. Teena Irani, [facility]  . Eye surgery      bil cataract surgery  . Amputation Left 11/29/2012    Procedure: Left Foot, Great Toe Amputation at MTP Joint;  Surgeon: Newt Minion, MD;  Location: [facility];  Service: Orthopedics;  Laterality: Left;  LEFT  Foot, Great Toe Amputation at MTP Joint  . Amputation Left 01/03/2013    Procedure: Left foot 2nd toe amputation at metatarsophalangeal;  Surgeon: Newt Minion, MD;  Location: [place];  Service: Orthopedics;  Laterality: Left;  . Tonsillectomy    . Vasectomy    . Amputation Left 03/19/2013    Procedure: AMPUTATION DIGIT;  Surgeon: Newt Minion, MD;  Location: [place];  Service: Orthopedics;  Laterality: Left;  Left foot Amputation 3, 4, 5th Toes at MTP joint  . Esophagogastroduodenoscopy    . Total knee arthroplasty Right 10/15/2013    Procedure: TOTAL KNEE ARTHROPLASTY;  Surgeon: Newt Minion, MD;  Location:  [facility];  Service: Orthopedics;  Laterality: Right;  Right Total Knee Arthroplasty  . Hernia repair N/A 2000    abdominal   HPI:  80 y/o male with PMH of GERD,  recent right total knee replacement undergoing rehab at SNF, recently diagnosed with PNA, admitted with acute suspected toxic metabolic encephalopathy from worsening renal failure, underlying sepsis. CT head negative. No obvious source of infection evident of chest XRAy. UA not consistent with UTI.    Assessment / Plan / Recommendation Clinical Impression  Bedside swallow eval complete. Patient with a functional appearing oropharyngeal swallow. Baseline congested cough noted which did not change in frequency during po intake. Patient prefers soft solid due to denture use. Other than GER, patient without significant PMH which would support aspiration. Education complete with patient regarding general safe swallowing and esophageal precautions. No SLP f/u indicated at this time. If PNA becomes recurrent in nature, may wish to consider  instrumental testing.     Aspiration Risk  Mild    Diet Recommendation Dysphagia 3 (Mechanical Soft);Thin liquid   Liquid Administration via: Cup;Straw Medication Administration: Whole meds with liquid Supervision: Patient able to self feed Compensations: Slow rate;Small sips/bites Postural Changes and/or Swallow Maneuvers: Seated upright 90 degrees;Upright 30-60 min after meal    Other  Recommendations Oral Care Recommendations: Oral care BID   Follow Up Recommendations  None    Frequency and Duration        Pertinent Vitals/Pain n/a        Swallow Study    General HPI: 80 y/o male with PMH of GERD,  recent right total knee replacement undergoing rehab at SNF, recently diagnosed with PNA, admitted with acute suspected toxic metabolic encephalopathy from worsening renal failure, underlying sepsis. CT head negative. No obvious source of infection evident of chest XRAy. UA not consistent with UTI.  Type of Study: Bedside swallow evaluation Previous Swallow Assessment: none noted Diet Prior to this Study: Dysphagia 3 (soft);Thin liquids Temperature Spikes Noted: No History of Recent Intubation: No Behavior/Cognition: Alert;Cooperative;Pleasant mood;Confused Oral Cavity - Dentition: Adequate natural dentition;Dentures, top Self-Feeding Abilities: Able to feed self Patient Positioning: Upright in bed Baseline Vocal Quality: Clear Volitional Cough: Strong;Congested Volitional Swallow: Able to elicit    Oral/Motor/Sensory Function Overall Oral Motor/Sensory Function: Appears within functional limits for tasks assessed   Ice Chips Ice chips: Not tested   Thin Liquid Thin Liquid: Within functional limits Presentation: Cup;Straw;Self Fed    Nectar Thick Nectar Thick Liquid: Not tested   Honey Thick Honey Thick Liquid: Not tested   Puree Puree: Not tested   Solid   GO   Zachary Swinger MA, CCC-SLP [phone]  Solid: Within functional limits Presentation: Self Fed        Zachary Vargas Zachary Vargas 11/09/2013,12:56 PM

## 2013-11-10 DIAGNOSIS — R404 Transient alteration of awareness: Secondary | ICD-10-CM

## 2013-11-10 LAB — BASIC METABOLIC PANEL
BUN: 46 mg/dL — AB (ref 6–23)
CO2: 19 mEq/L (ref 19–32)
Calcium: 9.1 mg/dL (ref 8.4–10.5)
Chloride: 109 mEq/L (ref 96–112)
Creatinine, Ser: 1.79 mg/dL — ABNORMAL HIGH (ref 0.50–1.35)
GFR calc Af Amer: 39 mL/min — ABNORMAL LOW (ref >90)
GFR calc non Af Amer: 33 mL/min — ABNORMAL LOW (ref >90)
GLUCOSE: 129 mg/dL — AB (ref 70–99)
Potassium: 4.4 mEq/L (ref 3.7–5.3)
Sodium: 141 mEq/L (ref 137–147)

## 2013-11-10 LAB — CBC
HEMATOCRIT: 25.6 % — AB (ref 39.0–52.0)
HEMOGLOBIN: 8.6 g/dL — AB (ref 13.0–17.0)
MCH: 31.2 pg (ref 26.0–34.0)
MCHC: 33.6 g/dL (ref 30.0–36.0)
MCV: 92.8 fL (ref 78.0–100.0)
Platelets: 241 10*3/uL (ref 150–400)
RBC: 2.76 MIL/uL — ABNORMAL LOW (ref 4.22–5.81)
RDW: 15.9 % — AB (ref 11.5–15.5)
WBC: 5.2 10*3/uL (ref 4.0–10.5)

## 2013-11-10 LAB — GLUCOSE, CAPILLARY
GLUCOSE-CAPILLARY: 95 mg/dL (ref 70–99)
Glucose-Capillary: 123 mg/dL — ABNORMAL HIGH (ref 70–99)
Glucose-Capillary: 125 mg/dL — ABNORMAL HIGH (ref 70–99)
Glucose-Capillary: 109 mg/dL — ABNORMAL HIGH (ref 70–99)

## 2013-11-10 MED ORDER — GLUCERNA SHAKE PO LIQD
237.0000 mL | Freq: Three times a day (TID) | ORAL | Status: DC
  Administered 2013-11-10 – 2013-11-13 (×6): 237 mL via ORAL

## 2013-11-10 NOTE — Progress Notes (Signed)
INITIAL NUTRITION ASSESSMENT  DOCUMENTATION CODES Per approved criteria  -Obesity Unspecified   INTERVENTION: Glucerna Shake po BID, each supplement provides 220 kcal and 10 grams of protein RD to follow for nutrition care plan  NUTRITION DIAGNOSIS: Inadequate oral intake related to poor appetite as evidenced by pt report  Goal: Pt to meet >/= 90% of their estimated nutrition needs   Monitor:  PO & supplemental intake, weight, labs, I/O's  Reason for Assessment: Malnutrition Screening Tool Report  80 y.o. male  Admitting Dx: Encephalopathy acute  ASSESSMENT: 80 y.o. Male with a PMH of recent total knee replacement on the right undergoing rehab at a SNF after his recent discharge on May 10 th, his post operative course was complicated by the development of acute blood loss anemia; presented 5/30 with AMS.  On arrival to the ER, patient was noted to be hypotensive and hypothermic, and given IV fluid boluses with good response. Chest x-ray was suggestive of chronic changes but could not rule out pneumonia.  Patient reports a poor appetite for the past 2 days; unsure if he's lost weight or not; per wt readings below, pt's weight has been stable; s/p bedside swallow evaluation 5/31 -- SLP recommending Dys 3, thin liquid diet; pt would benefit from addition of oral nutrition supplement; RD to order.  No muscle or subcutaneous fat depletion noticed.  Height: Ht Readings from Last 1 Encounters:  11/08/13 6\' 3"  (1.905 m)    Weight: Wt Readings from Last 1 Encounters:  11/10/13 269 lb 2.9 oz (122.1 kg)    Ideal Body Weight: 196 lb  % Ideal Body Weight: 137%  Wt Readings from Last 10 Encounters:  11/10/13 269 lb 2.9 oz (122.1 kg)  10/15/13 255 lb (115.667 kg)  10/15/13 255 lb (115.667 kg)  10/09/13 255 lb (115.667 kg)  07/25/13 256 lb (116.121 kg)  07/08/13 253 lb (114.76 kg)  05/22/13 254 lb (115.214 kg)  05/07/13 250 lb (113.399 kg)  03/25/13 252 lb (114.306 kg)   01/02/13 245 lb (111.131 kg)    Usual Body Weight: 255 lb  % Usual Body Weight: 105%  BMI:  Body mass index is 33.65 kg/(m^2).  Estimated Nutritional Needs: Kcal: 1950-2150  Protein: 95-105 gm Fluid: 1.9-2.1 L  Skin: Intact  Diet Order: Dysphagia 3, thin liquids  EDUCATION NEEDS: -No education needs identified at this time   Intake/Output Summary (Last 24 hours) at 11/10/13 1153 Last data filed at 11/10/13 1145  Gross per 24 hour  Intake   1770 ml  Output   2200 ml  Net   -430 ml    Labs:   Recent Labs Lab 11/08/13 0520 11/08/13 1700 11/09/13 0350 11/10/13 0412  NA 135*  --  135* 141  K 5.2  --  5.0 4.4  CL 101  --  107 109  CO2 16*  --  13* 19  BUN 85*  --  71* 46*  CREATININE 3.88* 3.11* 2.63* 1.79*  CALCIUM 9.3  --  8.9 9.1  GLUCOSE 81  --  81 129*    CBG (last 3)   Recent Labs  11/09/13 1617 11/09/13 2151 11/10/13 0832  GLUCAP 109* 126* 123*    Scheduled Meds: . aspirin EC  81 mg Oral Daily  . cholecalciferol  1,000 Units Oral Daily  . dorzolamide-timolol  1 drop Both Eyes BID  . febuxostat  40 mg Oral Daily  . ferrous gluconate  325 mg Oral Q breakfast  . heparin  5,000 Units Subcutaneous  3 times per day  . insulin aspart  0-9 Units Subcutaneous TID WC  . ipratropium-albuterol  3 mL Nebulization Q6H  . latanoprost  1 drop Right Eye QHS  . levothyroxine  50 mcg Oral QAC breakfast  . multivitamin with minerals  1 tablet Oral Daily  . nortriptyline  50 mg Oral QHS  . omega-3 acid ethyl esters  1 g Oral Daily  . pantoprazole  40 mg Oral Daily  . PARoxetine  40 mg Oral BH-q7a  . piperacillin-tazobactam (ZOSYN)  IV  3.375 g Intravenous 3 times per day  . saccharomyces boulardii  250 mg Oral Daily  . sodium chloride  3 mL Intravenous Q12H  . vancomycin  1,500 mg Intravenous Q48H    Continuous Infusions: . dextrose 5 % 1,000 mL with sodium bicarbonate 100 mEq infusion 75 mL/hr at 11/09/13 1953    Past Medical History  Diagnosis Date   . Glaucoma     uses Eye Drops daily  . Dyslipidemia     statin intolerant  . Gout     takes Colchicine and Uloric daily  . BPH (benign prostatic hyperplasia)     [facility]  . History of peptic ulcer disease   . Peptic ulcer disease   . Diverticulosis   . Lumbar spinal stenosis     Most recent MRI 12/ 2011; [facility] - Dr. Nelva Bush  . Carotid atherosclerosis     Dopplers 11/09 showing 50-69% stenosis  . Peripheral arterial disease     [facility] consult 1/08  . Epigastric pain     Hospitalization 7/11 [facility]  . Arthritis     Followed by [facility]  . Myeloma     IgA related; Dr. Marin Olp - 2009  . Headache(784.0)     rarely  . Constipation     but doesn't take any meds  . Urinary frequency   . Anxiety     takes Paxil daily  . Insomnia     takes Ambien nightly   . Anemia     of chronic disease;takes Ferrous Gluconate daily  . Osteomyelitis   . Hypertension     takes Lisinopril daily  . Peripheral edema     takes Furosemide daily  . Pneumonia     hx of-about 56yrs ago  . Peripheral neuropathy   . Joint pain   . Joint swelling   . Chronic back pain     spinal stenosis  . Chronic kidney disease     not on dialysis  . Hypothyroidism     takes Synthroid daily  . GERD (gastroesophageal reflux disease)     takes Protonix daily  . Depression   . Gout   . Diabetes mellitus     takes glipizide daily, Type 2  . Cataract   . Substance abuse     alcohol    Past Surgical History  Procedure Laterality Date  . Abdominal aortic aneurysm repair  03/1998  . Abdominal hernia repair  2000  . Renal stone surgery  1999  . Nerve root block      Multiple for lumbar spine  . Colonoscopy  07/22/2004    due repeat 2011; Dr. Teena Irani, [facility]  . Eye surgery      bil cataract surgery  . Amputation Left 11/29/2012    Procedure: Left Foot, Great Toe Amputation at MTP Joint;  Surgeon: Newt Minion, MD;  Location: [place];  Service: Orthopedics;   Laterality: Left;  LEFT  Foot, Great Toe Amputation at MTP Joint  . Amputation Left 01/03/2013    Procedure: Left foot 2nd toe amputation at metatarsophalangeal;  Surgeon: Newt Minion, MD;  Location: [place];  Service: Orthopedics;  Laterality: Left;  . Tonsillectomy    . Vasectomy    . Amputation Left 03/19/2013    Procedure: AMPUTATION DIGIT;  Surgeon: Newt Minion, MD;  Location: [place];  Service: Orthopedics;  Laterality: Left;  Left foot Amputation 3, 4, 5th Toes at MTP joint  . Esophagogastroduodenoscopy    . Total knee arthroplasty Right 10/15/2013    Procedure: TOTAL KNEE ARTHROPLASTY;  Surgeon: Newt Minion, MD;  Location: [place];  Service: Orthopedics;  Laterality: Right;  Right Total Knee Arthroplasty  . Hernia repair N/A 2000    abdominal    Arthur Holms, RD, LDN Pager #: [phone] After-Hours Pager #: [phone]

## 2013-11-10 NOTE — Progress Notes (Signed)
Physical Therapy Treatment Patient Details Name: Zachary Vargas MRN: [id number] DOB: 10-27-1927 Today's Date: 11/10/2013    History of Present Illness 80 y/o male with PMH of GERD,  recent right total knee replacement undergoing rehab at SNF, recently diagnosed with PNA, admitted with acute suspected toxic metabolic encephalopathy from worsening renal failure, underlying sepsis.     PT Comments    Pt progressing towards physical therapy goals. Tolerance for functional activity is very low, and is also the limiting factor in gait training. Pt states he needs a LTKA as well and feels as if it is going to give out on him while he is ambulating. Reviewed HEP for pt to perform in between therapy sessions. Will continue to follow and progress per POC.   Follow Up Recommendations  SNF     Equipment Recommendations  Rolling walker with 5" wheels;3in1 (PT)    Recommendations for Other Services       Precautions / Restrictions Precautions Precautions: Fall;Knee Precaution Comments: h/o multiple falls at home Restrictions Weight Bearing Restrictions: Yes RLE Weight Bearing: Weight bearing as tolerated    Mobility  Bed Mobility Overal bed mobility: Needs Assistance Bed Mobility: Supine to Sit     Supine to sit: Min assist     General bed mobility comments: VC's for sequencing and technique. Increased time to transition to EOB with min assist for trunk support.   Transfers Overall transfer level: Needs assistance Equipment used: Rolling walker (2 wheeled) Transfers: Sit to/from Stand Sit to Stand: +2 physical assistance;Mod assist         General transfer comment: VC's for hand placement on seated surface for safety. 2 attempts to reach full standing with mod assist +2. VC's for controlled descent to the chair however was unable to do so.   Ambulation/Gait Ambulation/Gait assistance: +2 safety/equipment;Min assist Ambulation Distance (Feet): 25 Feet Assistive device: Rolling  walker (2 wheeled) Gait Pattern/deviations: Step-to pattern;Decreased stride length;Trunk flexed Gait velocity: Decreased Gait velocity interpretation: Below normal speed for age/gender General Gait Details: Pt fatigues very quickly and required a seated rest break after 10 feet. Pt reports "seeing stars" during seated rest break but was motivated for second attempt.   Stairs            Wheelchair Mobility    Modified Rankin (Stroke Patients Only)       Balance Overall balance assessment: Needs assistance Sitting-balance support: Feet supported;Bilateral upper extremity supported Sitting balance-Leahy Scale: Fair     Standing balance support: Bilateral upper extremity supported Standing balance-Leahy Scale: Poor                      Cognition Arousal/Alertness: Awake/alert Behavior During Therapy: WFL for tasks assessed/performed Overall Cognitive Status: Within Functional Limits for tasks assessed                      Exercises Total Joint Exercises Ankle Circles/Pumps: 15 reps Quad Sets: 15 reps Heel Slides: 15 reps Hip ABduction/ADduction: 15 reps Straight Leg Raises: 15 reps    General Comments        Pertinent Vitals/Pain Vitals stable throughout session.     Home Living                      Prior Function            PT Goals (current goals can now be found in the care plan section) Acute Rehab PT Goals Patient Stated  Goal: Wants to get better PT Goal Formulation: With patient Time For Goal Achievement: 11/23/13 Potential to Achieve Goals: Fair Progress towards PT goals: Progressing toward goals    Frequency  Min 3X/week    PT Plan Current plan remains appropriate    Co-evaluation             End of Session Equipment Utilized During Treatment: Gait belt Activity Tolerance: Patient limited by fatigue Patient left: in chair;with call bell/phone within reach     Time: 7510-2585 PT Time Calculation (min):  34 min  Charges:  $Gait Training: 8-22 mins $Therapeutic Exercise: 8-22 mins                    G Codes:      Jolyn Lent 12/07/2013, 4:27 PM  Jolyn Lent, PT, DPT Acute Rehabilitation Services Pager: [phone]

## 2013-11-10 NOTE — Progress Notes (Signed)
[place] TEAM 1 - Stepdown/ICU TEAM Progress Note  Zachary Vargas MPN:[id number] DOB: 1928/04/05 DOA: 11/08/2013 PCP: Wyatt Haste, MD  Admit HPI / Brief Narrative: 80 y.o. M w/ a hx of recent R total knee replacement who was undergoing rehab at a SNF following his May 10th discharge.  His post operative course was complicated by the development of acute blood loss anemia.  He presented to the ED with altered mental status. Apparently at the SNF the patient was diagnosed with pneumonia and started on Avelox.  On arrival to the emergency room, patient was noted to be hypotensive and hypothermic. Chest x-ray was suggestive of chronic changes but could not rule out pneumonia.  Urine analysis was negative for UTI.  HPI/Subjective: Pt is alert and conversant, though he seems mildly confused.  Denies cp, sob, n/v, or abdom pain.    Assessment/Plan:  Encephalopathy acute  Suspected toxic metabolic encephalopathy from worsening renal failure, and underlying sepsis - CT head negative - clinically improving steadily   Sepsis  chest x-ray/CT chest negative for pneumonia - UA not consistent with UTI - suspect that cellulitis in bilateral lower extremities was the culprit - however does have chronic skin changes making exam difficult - empiric Vanco/Zosyn to continue for now - as continues to improve will attempt to narrow abx spectrum   Acute-on-chronic renal failure  Likely secondary to sepsis, and use of lasix + lisinopril - steadily improving with volume resuscitation and avoidance of potential nephrotoxins   Metabolic Acidosis  secondary to renal failure - lactic acid normal - resolving w/ improvement in renal fxn  Anemia, unspecified  Suspect anemia secondary to recent blood loss (operative) - transfused 2 units of PRBC on 5/30 - Hgb stable post transfusion - no obvious GI loss - continue iron supplementation.   Recent Total Right Knee Replacement  appreciate Ortho input, no signs of  knee infection, Unna compressive wrap applied on 5/30   Chronic gouty arthropathy with tophi Continue Uloric, hold Colchicine for now - if an acute flare occurs start prednisone   Depression  Cautiously continue with Celexa and nortriptyline  Hypothyroidism  Continue levothyroxine   Type II or unspecified type diabetes mellitus without mention of complication, not stated as uncontrolled  Oral hypoglycemic agents on hold - CBGs controlled  Code Status: FULL Family Communication: no family present at time of exam Disposition Plan: SDU  Consultants: none  Procedures: none  Antibiotics: Zosyn 5/30 >> Vanc 5/30 >>  DVT prophylaxis: SQ heparin  Objective: Blood pressure 128/74, pulse 87, temperature 98 F (36.7 C), temperature source Oral, resp. rate 25, height 6\' 3"  (1.905 m), weight 122.1 kg (269 lb 2.9 oz), SpO2 100.00%.  Intake/Output Summary (Last 24 hours) at 11/10/13 1341 Last data filed at 11/10/13 1200  Gross per 24 hour  Intake   2070 ml  Output   2400 ml  Net   -330 ml   Exam: General: No acute respiratory distress Lungs: mild bibasilar crackles - no wheeze  Cardiovascular: Regular rate and rhythm without murmur gallop or rub - distant HS  Abdomen: obese, nontender, nondistended, soft, bowel sounds positive, no rebound, no ascites, no appreciable mass Extremities: No significant cyanosis, or clubbing;  Chronic appearing edema bilateral lower extremities w/ UNNA boot type dressings to B LE   Data Reviewed: Basic Metabolic Panel:  Recent Labs Lab 11/08/13 0520 11/08/13 1700 11/09/13 0350 11/10/13 0412  NA 135*  --  135* 141  K 5.2  --  5.0 4.4  CL 101  --  107 109  CO2 16*  --  13* 19  GLUCOSE 81  --  81 129*  BUN 85*  --  71* 46*  CREATININE 3.88* 3.11* 2.63* 1.79*  CALCIUM 9.3  --  8.9 9.1   Liver Function Tests:  Recent Labs Lab 11/08/13 0520  AST 20  ALT 10  ALKPHOS 113  BILITOT 0.3  PROT 6.2  ALBUMIN 2.8*   CBC:  Recent  Labs Lab 11/08/13 0520 11/08/13 1700 11/09/13 0350 11/10/13 0412  WBC 9.6 6.3 5.4 5.2  NEUTROABS 5.4  --   --   --   HGB 7.2* 7.4* 8.2* 8.6*  HCT 21.3* 22.4* 24.5* 25.6*  MCV 95.9 95.3 93.2 92.8  PLT 257 244 217 241   CBG:  Recent Labs Lab 11/09/13 1110 11/09/13 1617 11/09/13 2151 11/10/13 0832 11/10/13 1252  GLUCAP 168* 109* 126* 123* 125*    Recent Results (from the past 240 hour(s))  CULTURE, BLOOD (ROUTINE X 2)     Status: None   Collection Time    11/08/13  5:12 AM      Result Value Ref Range Status   Specimen Description BLOOD LEFT ARM   Final   Special Requests BOTTLES DRAWN AEROBIC AND ANAEROBIC 5CC   Final   Culture  Setup Time     Final   Value: 11/08/2013 11:23     Performed at [facility]   Culture     Final   Value:        BLOOD CULTURE RECEIVED NO GROWTH TO DATE CULTURE WILL BE HELD FOR 5 DAYS BEFORE ISSUING A FINAL NEGATIVE REPORT     Performed at [facility]   Report Status PENDING   Incomplete  CULTURE, BLOOD (ROUTINE X 2)     Status: None   Collection Time    11/08/13  5:20 AM      Result Value Ref Range Status   Specimen Description BLOOD RIGHT ARM   Final   Special Requests BOTTLES DRAWN AEROBIC AND ANAEROBIC 5CC   Final   Culture  Setup Time     Final   Value: 11/08/2013 11:23     Performed at [facility]   Culture     Final   Value:        BLOOD CULTURE RECEIVED NO GROWTH TO DATE CULTURE WILL BE HELD FOR 5 DAYS BEFORE ISSUING A FINAL NEGATIVE REPORT     Performed at [facility]   Report Status PENDING   Incomplete  URINE CULTURE     Status: None   Collection Time    11/08/13  5:25 AM      Result Value Ref Range Status   Specimen Description URINE, CATHETERIZED   Final   Special Requests NONE   Final   Culture  Setup Time     Final   Value: 11/08/2013 11:24     Performed at [facility] Count     Final   Value: 2,000 COLONIES/ML     Performed at [facility]   Culture      Final   Value: INSIGNIFICANT GROWTH     Performed at [facility]   Report Status 11/09/2013 FINAL   Final  MRSA PCR SCREENING     Status: None   Collection Time    11/08/13  4:49 PM      Result Value Ref Range Status   MRSA by  PCR NEGATIVE  NEGATIVE Final   Comment:            The GeneXpert MRSA Assay (FDA     approved for NASAL specimens     only), is one component of a     comprehensive MRSA colonization     surveillance program. It is not     intended to diagnose MRSA     infection nor to guide or     monitor treatment for     MRSA infections.     Studies:  Recent x-ray studies have been reviewed in detail by the Attending Physician  Scheduled Meds:  Scheduled Meds: . aspirin EC  81 mg Oral Daily  . cholecalciferol  1,000 Units Oral Daily  . dorzolamide-timolol  1 drop Both Eyes BID  . febuxostat  40 mg Oral Daily  . feeding supplement (GLUCERNA SHAKE)  237 mL Oral TID BM  . ferrous gluconate  325 mg Oral Q breakfast  . heparin  5,000 Units Subcutaneous 3 times per day  . insulin aspart  0-9 Units Subcutaneous TID WC  . ipratropium-albuterol  3 mL Nebulization Q6H  . latanoprost  1 drop Right Eye QHS  . levothyroxine  50 mcg Oral QAC breakfast  . multivitamin with minerals  1 tablet Oral Daily  . nortriptyline  50 mg Oral QHS  . omega-3 acid ethyl esters  1 g Oral Daily  . pantoprazole  40 mg Oral Daily  . PARoxetine  40 mg Oral BH-q7a  . piperacillin-tazobactam (ZOSYN)  IV  3.375 g Intravenous 3 times per day  . saccharomyces boulardii  250 mg Oral Daily  . sodium chloride  3 mL Intravenous Q12H  . vancomycin  1,500 mg Intravenous Q48H    Time spent on care of this patient: 35 mins   Cherene Altes , MD   [facility] Office  [phone] Pager - Text Page per Amion as per below:  On-Call/Text Page:      [email or web address]      password [email or web address]  If 7PM-7AM, please contact night-coverage [email or web address] Password [email or web address] 11/10/2013, 1:41 PM   LOS:  2 days

## 2013-11-10 NOTE — Progress Notes (Signed)
CSW notified patient's wife Zachary Vargas with bed offers. She has chosen [facility]. Wife was emphatic that she is the decision maker- not their son [name] and she is the one person to be contacted with any medical issues, decisions etc.  CSW spoke to [place] at [place]- she will contact patient's wife who is currently in [place] for admission information.  CSW will need to follow up with MD re: tentative d/c date.  [name]. [place], [place]

## 2013-11-11 DIAGNOSIS — F3289 Other specified depressive episodes: Secondary | ICD-10-CM

## 2013-11-11 DIAGNOSIS — F329 Major depressive disorder, single episode, unspecified: Secondary | ICD-10-CM

## 2013-11-11 DIAGNOSIS — Z96659 Presence of unspecified artificial knee joint: Secondary | ICD-10-CM

## 2013-11-11 DIAGNOSIS — E039 Hypothyroidism, unspecified: Secondary | ICD-10-CM

## 2013-11-11 DIAGNOSIS — D649 Anemia, unspecified: Secondary | ICD-10-CM

## 2013-11-11 DIAGNOSIS — G609 Hereditary and idiopathic neuropathy, unspecified: Secondary | ICD-10-CM

## 2013-11-11 DIAGNOSIS — M129 Arthropathy, unspecified: Secondary | ICD-10-CM

## 2013-11-11 DIAGNOSIS — M1A00X1 Idiopathic chronic gout, unspecified site, with tophus (tophi): Secondary | ICD-10-CM

## 2013-11-11 DIAGNOSIS — E119 Type 2 diabetes mellitus without complications: Secondary | ICD-10-CM

## 2013-11-11 LAB — GLUCOSE, CAPILLARY
GLUCOSE-CAPILLARY: 100 mg/dL — AB (ref 70–99)
GLUCOSE-CAPILLARY: 133 mg/dL — AB (ref 70–99)
Glucose-Capillary: 127 mg/dL — ABNORMAL HIGH (ref 70–99)
Glucose-Capillary: 108 mg/dL — ABNORMAL HIGH (ref 70–99)

## 2013-11-11 LAB — CBC
HEMATOCRIT: 27.8 % — AB (ref 39.0–52.0)
Hemoglobin: 9.3 g/dL — ABNORMAL LOW (ref 13.0–17.0)
MCH: 31 pg (ref 26.0–34.0)
MCHC: 33.5 g/dL (ref 30.0–36.0)
MCV: 92.7 fL (ref 78.0–100.0)
Platelets: 236 10*3/uL (ref 150–400)
RBC: 3 MIL/uL — ABNORMAL LOW (ref 4.22–5.81)
RDW: 15.5 % (ref 11.5–15.5)
WBC: 6.5 10*3/uL (ref 4.0–10.5)

## 2013-11-11 LAB — BASIC METABOLIC PANEL
BUN: 29 mg/dL — ABNORMAL HIGH (ref 6–23)
CHLORIDE: 107 meq/L (ref 96–112)
CO2: 24 meq/L (ref 19–32)
CREATININE: 1.31 mg/dL (ref 0.50–1.35)
Calcium: 9.1 mg/dL (ref 8.4–10.5)
GFR calc Af Amer: 56 mL/min — ABNORMAL LOW (ref >90)
GFR calc non Af Amer: 49 mL/min — ABNORMAL LOW (ref >90)
GLUCOSE: 123 mg/dL — AB (ref 70–99)
Potassium: 4.4 mEq/L (ref 3.7–5.3)
Sodium: 143 mEq/L (ref 137–147)

## 2013-11-11 LAB — TSH: TSH: 12.48 u[IU]/mL — AB (ref 0.350–4.500)

## 2013-11-11 MED ORDER — VANCOMYCIN HCL 10 G IV SOLR
1500.0000 mg | INTRAVENOUS | Status: DC
  Administered 2013-11-11 – 2013-11-12 (×2): 1500 mg via INTRAVENOUS
  Filled 2013-11-11 (×4): qty 1500

## 2013-11-11 MED ORDER — LISINOPRIL 10 MG PO TABS
10.0000 mg | ORAL_TABLET | Freq: Every day | ORAL | Status: DC
  Administered 2013-11-11 – 2013-11-13 (×3): 10 mg via ORAL
  Filled 2013-11-11 (×3): qty 1

## 2013-11-11 NOTE — Progress Notes (Signed)
ANTIBIOTIC CONSULT NOTE - FOLLOW UP  Pharmacy Consult for vancomycin, Zosyn Indication: rule out sepsis  Allergies  Allergen Reactions  . Morphine And Related Hives and Shortness Of Breath  . Statins Hives    Pt notes that "all" classes of cholesterol meds do the same   . Allopurinol Hives, Swelling and Rash    Patient Measurements: Height: 6\' 3"  (190.5 cm) Weight: 268 lb 15.4 oz (122 kg) IBW/kg (Calculated) : 84.5 Vital Signs: Temp: 98.6 F (37 C) (06/02 1300) Temp src: Oral (06/02 1300) BP: 123/67 mmHg (06/02 1157) Pulse Rate: 75 (06/02 1300) Intake/Output from previous day: 06/01 0701 - 06/02 0700 In: 2262.5 [P.O.:800; I.V.:1312.5; IV Piggyback:150] Out: 2150 [Urine:2150] Intake/Output from this shift: Total I/O In: 3 [I.V.:3] Out: 350 [Urine:350] Labs:  Recent Labs  11/09/13 0350 11/10/13 0412 11/11/13 0427  WBC 5.4 5.2 6.5  HGB 8.2* 8.6* 9.3*  PLT 217 241 236  CREATININE 2.63* 1.79* 1.31   Estimated Creatinine Clearance: 60.1 ml/min (by C-G formula based on Cr of 1.31).  Assessment: 85 YOM on day # 4 of vancomycin and Zosyn for r/o sepsis with presumed LE cellulitis as source. Renal function is improving with current CrCl ~ 67mL/min. UOP ~0.7 cc/kg/hr. WBC is within normal limits. Afebrile.   Vanc 5/30>> Zosyn 5/30>>  5/30 Bld x2 - ngtd 5/30 Urine - insignificant growth 5/30 MRSA screen neg  Goal of Therapy:  Vancomycin trough level 10-15 mcg/ml  Plan:  1. Change vancomycin to 1500 mg IV every 24 hours. 2. Continue Zosyn 3.375 g IV every 8 hours - each dose over 4 hours.  3. Follow-up renal function, culture data, and clinical response. 4. Follow-up duration of therapy.   Sloan Leiter, PharmD, BCPS Clinical Pharmacist [phone] 11/11/2013,2:59 PM

## 2013-11-11 NOTE — Progress Notes (Signed)
[place] TEAM 1 - Stepdown/ICU TEAM Progress Note  Zachary Vargas CZY:[id number] DOB: 1928/01/25 DOA: 11/08/2013 PCP: Wyatt Haste, MD  Admit HPI / Brief Narrative: 80 y.o. M w/ a hx of recent R total knee replacement who was undergoing rehab at a SNF following his May 10th discharge.  His post operative course was complicated by the development of acute blood loss anemia.  He presented to the ED with altered mental status. Apparently at the SNF the patient was diagnosed with pneumonia and started on Avelox.  On arrival to the emergency room, patient was noted to be hypotensive and hypothermic. Chest x-ray was suggestive of chronic changes but could not rule out pneumonia.  Urine analysis was negative for UTI.  HPI/Subjective: Pt awakens but seems drowsy and complains of being sleepy. Denies CP or SOB- no pain in legs  Assessment/Plan:  Encephalopathy acute  Suspected toxic metabolic encephalopathy from worsening renal failure, and underlying sepsis - CT head negative - clinically improving steadily   Sepsis  chest x-ray/CT chest negative for pneumonia - UA not consistent with UTI - suspect that cellulitis in bilateral lower extremities was the culprit - however does have chronic skin changes making exam difficult - empiric Vanco/Zosyn to continue for now - as continues to improve consider narrow abx spectrum   Acute-on-chronic renal failure  Likely secondary to sepsis, and use of lasix + lisinopril - steadily improving with volume resuscitation and avoidance of potential nephrotoxins -since BP up will resume ACE I but watch renal fnx closely-since upper airway congestion and BUN/cr at baseline will KVO fluids  Metabolic Acidosis  secondary to renal failure - lactic acid normal - resolving w/ improvement in renal fxn  HTN Resume ACE I cautiously  Anemia, unspecified  Suspect anemia secondary to recent blood loss (operative) - was transfused 2 units of PRBC on 5/30 - Hgb  remains - no obvious GI loss - continue iron supplementation.   Recent Total Right Knee Replacement  appreciate Ortho input, no signs of knee infection, Unna compressive wrap applied on 5/30   Chronic gouty arthropathy with tophi Continue Uloric, hold Colchicine for now - if an acute flare occurs would start prednisone given ARF  ? Chronic LE edema ? Rationale for Lasix pre admit -no old ECHO to determine if has h/o CHF-possibly using in setting of CKD  Depression  Cautiously continue with Celexa and nortriptyline  Hypothyroidism  Continue levothyroxine, obtain TSH   Type II or unspecified type diabetes mellitus without mention of complication, not stated as uncontrolled  Oral hypoglycemic agents on hold - CBGs remain controlled  Code Status: FULL Family Communication: no family present at time of exam-SW note reviewed and wife FAYE says to only contact her re: medical decisions, etc. since she is the decision maker and not the son [name] Disposition Plan: SDU due to lethargy- dc to SNF  Consultants: none  Procedures: none  Antibiotics: Zosyn 5/30 >> Vanc 5/30 >>  DVT prophylaxis: SQ heparin  Objective: Blood pressure 140/60, pulse 80, temperature 97.7 F (36.5 C), temperature source Oral, resp. rate 14, height 6\' 3"  (1.905 m), weight 268 lb 15.4 oz (122 kg), SpO2 97.00%.  Intake/Output Summary (Last 24 hours) at 11/11/13 0956 Last data filed at 11/11/13 0932  Gross per 24 hour  Intake 2065.5 ml  Output   1800 ml  Net  265.5 ml   Exam: General: No acute respiratory distress-poor appetite and intake Lungs: mostly clear with upper airway congestion - no wheeze -  RA Cardiovascular: Regular rate and rhythm without murmur gallop or rub - distant HS  Abdomen: obese, nontender, nondistended, soft, bowel sounds positive, no rebound, no ascites, no appreciable mass Extremities: No significant cyanosis, or clubbing;  Chronic appearing edema bilateral lower extremities w/  UNNA boot type dressings to B LE -non tender to palpation  Data Reviewed: Basic Metabolic Panel:  Recent Labs Lab 11/08/13 0520 11/08/13 1700 11/09/13 0350 11/10/13 0412 11/11/13 0427  NA 135*  --  135* 141 143  K 5.2  --  5.0 4.4 4.4  CL 101  --  107 109 107  CO2 16*  --  13* 19 24  GLUCOSE 81  --  81 129* 123*  BUN 85*  --  71* 46* 29*  CREATININE 3.88* 3.11* 2.63* 1.79* 1.31  CALCIUM 9.3  --  8.9 9.1 9.1   Liver Function Tests:  Recent Labs Lab 11/08/13 0520  AST 20  ALT 10  ALKPHOS 113  BILITOT 0.3  PROT 6.2  ALBUMIN 2.8*   CBC:  Recent Labs Lab 11/08/13 0520 11/08/13 1700 11/09/13 0350 11/10/13 0412 11/11/13 0427  WBC 9.6 6.3 5.4 5.2 6.5  NEUTROABS 5.4  --   --   --   --   HGB 7.2* 7.4* 8.2* 8.6* 9.3*  HCT 21.3* 22.4* 24.5* 25.6* 27.8*  MCV 95.9 95.3 93.2 92.8 92.7  PLT 257 244 217 241 236   CBG:  Recent Labs Lab 11/10/13 0832 11/10/13 1252 11/10/13 1727 11/10/13 2121 11/11/13 0825  GLUCAP 123* 125* 109* 95 127*    Recent Results (from the past 240 hour(s))  CULTURE, BLOOD (ROUTINE X 2)     Status: None   Collection Time    11/08/13  5:12 AM      Result Value Ref Range Status   Specimen Description BLOOD LEFT ARM   Final   Special Requests BOTTLES DRAWN AEROBIC AND ANAEROBIC 5CC   Final   Culture  Setup Time     Final   Value: 11/08/2013 11:23     Performed at [facility]   Culture     Final   Value:        BLOOD CULTURE RECEIVED NO GROWTH TO DATE CULTURE WILL BE HELD FOR 5 DAYS BEFORE ISSUING A FINAL NEGATIVE REPORT     Performed at [facility]   Report Status PENDING   Incomplete  CULTURE, BLOOD (ROUTINE X 2)     Status: None   Collection Time    11/08/13  5:20 AM      Result Value Ref Range Status   Specimen Description BLOOD RIGHT ARM   Final   Special Requests BOTTLES DRAWN AEROBIC AND ANAEROBIC 5CC   Final   Culture  Setup Time     Final   Value: 11/08/2013 11:23     Performed at [facility]    Culture     Final   Value:        BLOOD CULTURE RECEIVED NO GROWTH TO DATE CULTURE WILL BE HELD FOR 5 DAYS BEFORE ISSUING A FINAL NEGATIVE REPORT     Performed at [facility]   Report Status PENDING   Incomplete  URINE CULTURE     Status: None   Collection Time    11/08/13  5:25 AM      Result Value Ref Range Status   Specimen Description URINE, CATHETERIZED   Final   Special Requests NONE   Final   Culture  Setup Time     Final   Value: 11/08/2013 11:24     Performed at [place]     Final   Value: 2,000 COLONIES/ML     Performed at [facility]   Culture     Final   Value: INSIGNIFICANT GROWTH     Performed at [facility]   Report Status 11/09/2013 FINAL   Final  MRSA PCR SCREENING     Status: None   Collection Time    11/08/13  4:49 PM      Result Value Ref Range Status   MRSA by PCR NEGATIVE  NEGATIVE Final   Comment:            The GeneXpert MRSA Assay (FDA     approved for NASAL specimens     only), is one component of a     comprehensive MRSA colonization     surveillance program. It is not     intended to diagnose MRSA     infection nor to guide or     monitor treatment for     MRSA infections.     Studies:  Recent x-ray studies have been reviewed in detail by the Attending Physician  Scheduled Meds:  Scheduled Meds: . aspirin EC  81 mg Oral Daily  . cholecalciferol  1,000 Units Oral Daily  . dorzolamide-timolol  1 drop Both Eyes BID  . febuxostat  40 mg Oral Daily  . feeding supplement (GLUCERNA SHAKE)  237 mL Oral TID BM  . ferrous gluconate  325 mg Oral Q breakfast  . heparin  5,000 Units Subcutaneous 3 times per day  . insulin aspart  0-9 Units Subcutaneous TID WC  . latanoprost  1 drop Right Eye QHS  . levothyroxine  50 mcg Oral QAC breakfast  . lisinopril  10 mg Oral Daily  . multivitamin with minerals  1 tablet Oral Daily  . nortriptyline  50 mg Oral QHS  . omega-3 acid ethyl esters  1 g Oral  Daily  . pantoprazole  40 mg Oral Daily  . PARoxetine  40 mg Oral BH-q7a  . piperacillin-tazobactam (ZOSYN)  IV  3.375 g Intravenous 3 times per day  . saccharomyces boulardii  250 mg Oral Daily  . sodium chloride  3 mL Intravenous Q12H  . vancomycin  1,500 mg Intravenous Q48H    Time spent on care of this patient: 35 mins   Samella Parr , ANP   [facility] Office  [phone] Pager - Text Page per Shea Evans as per below:  On-Call/Text Page:      [email or web address]      password [email or web address]  If 7PM-7AM, please contact night-coverage [email or web address] Password [email or web address] 11/11/2013, 9:56 AM   LOS: 3 days    Examined patient with ANP Bryson Ha discussed assessment and plan and agree with plan. Discuss plan with patient and answered all questions Per patient care greater than 35 minutes

## 2013-11-12 LAB — CBC
HCT: 25.2 % — ABNORMAL LOW (ref 39.0–52.0)
Hemoglobin: 8.4 g/dL — ABNORMAL LOW (ref 13.0–17.0)
MCH: 30.9 pg (ref 26.0–34.0)
MCHC: 33.3 g/dL (ref 30.0–36.0)
MCV: 92.6 fL (ref 78.0–100.0)
PLATELETS: 236 10*3/uL (ref 150–400)
RBC: 2.72 MIL/uL — ABNORMAL LOW (ref 4.22–5.81)
RDW: 15.2 % (ref 11.5–15.5)
WBC: 6.3 10*3/uL (ref 4.0–10.5)

## 2013-11-12 LAB — BASIC METABOLIC PANEL
BUN: 21 mg/dL (ref 6–23)
CO2: 24 meq/L (ref 19–32)
Calcium: 8.9 mg/dL (ref 8.4–10.5)
Chloride: 106 mEq/L (ref 96–112)
Creatinine, Ser: 1.22 mg/dL (ref 0.50–1.35)
GFR calc non Af Amer: 53 mL/min — ABNORMAL LOW (ref >90)
GFR, EST AFRICAN AMERICAN: 61 mL/min — AB (ref >90)
Glucose, Bld: 104 mg/dL — ABNORMAL HIGH (ref 70–99)
POTASSIUM: 3.9 meq/L (ref 3.7–5.3)
SODIUM: 141 meq/L (ref 137–147)

## 2013-11-12 LAB — GLUCOSE, CAPILLARY
GLUCOSE-CAPILLARY: 127 mg/dL — AB (ref 70–99)
Glucose-Capillary: 116 mg/dL — ABNORMAL HIGH (ref 70–99)
Glucose-Capillary: 91 mg/dL (ref 70–99)
Glucose-Capillary: 122 mg/dL — ABNORMAL HIGH (ref 70–99)

## 2013-11-12 LAB — T4, FREE: Free T4: 0.96 ng/dL (ref 0.80–1.80)

## 2013-11-12 NOTE — Care Management Note (Signed)
    Page 1 of 1   11/13/2013     1:49:39 PM CARE MANAGEMENT NOTE 11/13/2013  Patient:  Zachary Vargas, Zachary Vargas   Account Number:  [id number]  Date Initiated:  11/12/2013  Documentation initiated by:  Tomi Bamberger  Subjective/Objective Assessment:   dx sepsis  admit- from [facility] SNF per admission note.     Action/Plan:   pt eval- rec snf   Anticipated DC Date:  11/13/2013   Anticipated DC Plan:  SKILLED NURSING FACILITY  In-house referral  Clinical Social Worker      DC Planning Services  CM consult      Choice offered to / List presented to:             Status of service:  Completed, signed off Medicare Important Message given?  YES (If response is "NO", the following Medicare IM given date fields will be blank) Date Medicare IM given:  11/13/2013 Date Additional Medicare IM given:    Discharge Disposition:  [place]  Per UR Regulation:  Reviewed for med. necessity/level of care/duration of stay  If discussed at [place] of Stay Meetings, dates discussed:    Comments:  11/13/13 [place], BSN [phone] patient for dc to [facility] SNF today. CSW following.  11/12/13 [place], BSN [phone] transfer from 2c sdu, per MD note patient for dc in next 24-48 hrs.  CSW aware.

## 2013-11-12 NOTE — Progress Notes (Signed)
Orthopedic Tech Progress Note Patient Details:  Zachary Vargas 1928/06/02 [id number]  Ortho Devices Type of Ortho Device: Louretta Parma boot Ortho Device/Splint Location: lle Ortho Device/Splint Interventions: Application   Hildred Priest 11/12/2013, 5:49 PM

## 2013-11-12 NOTE — Progress Notes (Signed)
[place] TEAM 1 - Stepdown/ICU TEAM Progress Note  Zachary Vargas EXB:[id number] DOB: December 14, 1927 DOA: 11/08/2013 PCP: Wyatt Haste, MD  Admit HPI / Brief Narrative: 80 y.o. M w/ a hx of recent R total knee replacement who was undergoing rehab at a SNF following his May 10th discharge.  His post operative course was complicated by the development of acute blood loss anemia.  He presented to the ED with altered mental status. Apparently at the SNF the patient was diagnosed with pneumonia and started on Avelox.  On arrival to the emergency room, patient was noted to be hypotensive and hypothermic. Chest x-ray was suggestive of chronic changes but could not rule out pneumonia.  Urine analysis was negative for UTI.  HPI/Subjective: Pt awake and conversant.  Very mildly confused but much improved.  Denies cp, sob, n/v, or abdom pain.    Assessment/Plan:  Encephalopathy acute  Suspected toxic metabolic encephalopathy from worsening renal failure, and underlying sepsis - CT head negative - resolving - appears to have a component of dementia/ST memory deficits  Sepsis  chest x-ray/CT chest negative for pneumonia - UA not consistent with UTI - suspect that cellulitis in bilateral lower extremities was the culprit - however does have chronic skin changes  - cont empiric Vanco/Zosyn - consider narrow anbx's 6/4 if continues to improve   Acute-on-chronic renal failure  Likely secondary to sepsis, and use of lasix + lisinopril - steadily improving with volume resuscitation and avoidance of potential nephrotoxins - cont ACE I (resumed 6/2) but watch renal fnx closely - BUN/Scr at baseline  Metabolic Acidosis  secondary to renal failure - lactic acid normal - resolved  HTN cont ACE I cautiously  Anemia, unspecified  Suspect anemia secondary to recent blood loss (operative) - was transfused 2 units of PRBC on 5/30 - Hgb remains stable - no obvious GI loss - continue iron supplementation.    Recent Total Right Knee Replacement  appreciate Ortho input, no signs of knee infection, Unna compressive wrap applied on 5/30 -return to SNF for rehab at dc  Chronic gouty arthropathy with tophi Continue Uloric, hold Colchicine for now - if an acute flare occurs would start prednisone given ARF  ? Chronic LE edema ?rationale for Lasix pre admit - no old ECHO to determine if has h/o CHF-possibly using in setting of CKD  Depression  Cautiously continue with Celexa and nortriptyline  Hypothyroidism  Continue levothyroxine TSH =12 so ck Free T4  Type II or unspecified type diabetes mellitus without mention of complication, not stated as uncontrolled  Oral hypoglycemic agents on hold - CBGs remain controlled  Code Status: FULL Family Communication: no family present at time of exam-SW note reviewed 6/2 and wife Letta Median says to only contact her re: medical decisions, etc. since she is the decision maker and not the son [name] Disposition Plan: Transfer to floor- ultimately dc to SNF when medically stable- hopefully in next 48 hrs  Consultants: none  Procedures: none  Antibiotics: Zosyn 5/30 >> Vanc 5/30 >>  DVT prophylaxis: SQ heparin  Objective: Blood pressure 142/66, pulse 86, temperature 98.4 F (36.9 C), temperature source Oral, resp. rate 18, height 6\' 3"  (1.905 m), weight 268 lb 15.4 oz (122 kg), SpO2 100.00%.  Intake/Output Summary (Last 24 hours) at 11/12/13 1207 Last data filed at 11/12/13 1000  Gross per 24 hour  Intake 1116.67 ml  Output   1425 ml  Net -308.33 ml   Exam: General: No acute respiratory distress Lungs: clear  to auscultation - no wheeze - RA Cardiovascular: Regular rate and rhythm without murmur gallop or rub  Abdomen: obese, nontender, nondistended, soft, bowel sounds positive, no rebound, no ascites, no appreciable mass Extremities: No significant cyanosis, or clubbing;  Chronic appearing edema bilateral lower extremities w/ UNNA boot type  dressings to B LE   Data Reviewed: Basic Metabolic Panel:  Recent Labs Lab 11/08/13 0520 11/08/13 1700 11/09/13 0350 11/10/13 0412 11/11/13 0427 11/12/13 0230  NA 135*  --  135* 141 143 141  K 5.2  --  5.0 4.4 4.4 3.9  CL 101  --  107 109 107 106  CO2 16*  --  13* 19 24 24   GLUCOSE 81  --  81 129* 123* 104*  BUN 85*  --  71* 46* 29* 21  CREATININE 3.88* 3.11* 2.63* 1.79* 1.31 1.22  CALCIUM 9.3  --  8.9 9.1 9.1 8.9   Liver Function Tests:  Recent Labs Lab 11/08/13 0520  AST 20  ALT 10  ALKPHOS 113  BILITOT 0.3  PROT 6.2  ALBUMIN 2.8*   CBC:  Recent Labs Lab 11/08/13 0520 11/08/13 1700 11/09/13 0350 11/10/13 0412 11/11/13 0427 11/12/13 0230  WBC 9.6 6.3 5.4 5.2 6.5 6.3  NEUTROABS 5.4  --   --   --   --   --   HGB 7.2* 7.4* 8.2* 8.6* 9.3* 8.4*  HCT 21.3* 22.4* 24.5* 25.6* 27.8* 25.2*  MCV 95.9 95.3 93.2 92.8 92.7 92.6  PLT 257 244 217 241 236 236   CBG:  Recent Labs Lab 11/11/13 0825 11/11/13 1301 11/11/13 1700 11/11/13 2100 11/12/13 0745  GLUCAP 127* 100* 108* 133* 116*    Recent Results (from the past 240 hour(s))  CULTURE, BLOOD (ROUTINE X 2)     Status: None   Collection Time    11/08/13  5:12 AM      Result Value Ref Range Status   Specimen Description BLOOD LEFT ARM   Final   Special Requests BOTTLES DRAWN AEROBIC AND ANAEROBIC 5CC   Final   Culture  Setup Time     Final   Value: 11/08/2013 11:23     Performed at [facility]   Culture     Final   Value:        BLOOD CULTURE RECEIVED NO GROWTH TO DATE CULTURE WILL BE HELD FOR 5 DAYS BEFORE ISSUING A FINAL NEGATIVE REPORT     Performed at [facility]   Report Status PENDING   Incomplete  CULTURE, BLOOD (ROUTINE X 2)     Status: None   Collection Time    11/08/13  5:20 AM      Result Value Ref Range Status   Specimen Description BLOOD RIGHT ARM   Final   Special Requests BOTTLES DRAWN AEROBIC AND ANAEROBIC 5CC   Final   Culture  Setup Time     Final   Value:  11/08/2013 11:23     Performed at [facility]   Culture     Final   Value:        BLOOD CULTURE RECEIVED NO GROWTH TO DATE CULTURE WILL BE HELD FOR 5 DAYS BEFORE ISSUING A FINAL NEGATIVE REPORT     Performed at [facility]   Report Status PENDING   Incomplete  URINE CULTURE     Status: None   Collection Time    11/08/13  5:25 AM      Result Value Ref Range Status  Specimen Description URINE, CATHETERIZED   Final   Special Requests NONE   Final   Culture  Setup Time     Final   Value: 11/08/2013 11:24     Performed at [place]     Final   Value: 2,000 COLONIES/ML     Performed at [facility]   Culture     Final   Value: INSIGNIFICANT GROWTH     Performed at [facility]   Report Status 11/09/2013 FINAL   Final  MRSA PCR SCREENING     Status: None   Collection Time    11/08/13  4:49 PM      Result Value Ref Range Status   MRSA by PCR NEGATIVE  NEGATIVE Final   Comment:            The GeneXpert MRSA Assay (FDA     approved for NASAL specimens     only), is one component of a     comprehensive MRSA colonization     surveillance program. It is not     intended to diagnose MRSA     infection nor to guide or     monitor treatment for     MRSA infections.     Studies:  Recent x-ray studies have been reviewed in detail by the Attending Physician  Scheduled Meds:  Scheduled Meds: . aspirin EC  81 mg Oral Daily  . cholecalciferol  1,000 Units Oral Daily  . dorzolamide-timolol  1 drop Both Eyes BID  . febuxostat  40 mg Oral Daily  . feeding supplement (GLUCERNA SHAKE)  237 mL Oral TID BM  . ferrous gluconate  325 mg Oral Q breakfast  . heparin  5,000 Units Subcutaneous 3 times per day  . insulin aspart  0-9 Units Subcutaneous TID WC  . latanoprost  1 drop Right Eye QHS  . levothyroxine  50 mcg Oral QAC breakfast  . lisinopril  10 mg Oral Daily  . multivitamin with minerals  1 tablet Oral Daily  .  nortriptyline  50 mg Oral QHS  . omega-3 acid ethyl esters  1 g Oral Daily  . pantoprazole  40 mg Oral Daily  . PARoxetine  40 mg Oral BH-q7a  . piperacillin-tazobactam (ZOSYN)  IV  3.375 g Intravenous 3 times per day  . saccharomyces boulardii  250 mg Oral Daily  . sodium chloride  3 mL Intravenous Q12H  . vancomycin  1,500 mg Intravenous Q24H    Time spent on care of this patient: 35 mins   Samella Parr , ANP   [facility] Office  [phone] Pager - Text Page per Shea Evans as per below:  On-Call/Text Page:      [email or web address]      password [email or web address]  If 7PM-7AM, please contact night-coverage [email or web address] Password [email or web address] 11/12/2013, 12:07 PM   LOS: 4 days   I have personally examined this patient and reviewed the entire database. I have reviewed the above note, made any necessary editorial changes, and agree with its content.  Cherene Altes, MD [facility]

## 2013-11-12 NOTE — Progress Notes (Signed)
Physical Therapy Treatment Patient Details Name: Zachary Vargas MRN: [id number] DOB: 10-21-1927 Today's Date: 11/12/2013    History of Present Illness 80 y/o male with PMH of GERD,  recent right total knee replacement undergoing rehab at SNF, recently diagnosed with PNA, admitted with acute suspected toxic metabolic encephalopathy from worsening renal failure, underlying sepsis.     PT Comments    Pt tolerated session well today with vc for breathing techniques during exercises (pt tends to hold his breath). No dyspnea with OOB activity with SaO2 97% on RA. Session limited by need to transfer pt to his new room.  Follow Up Recommendations  SNF     Equipment Recommendations  Rolling walker with 5" wheels;3in1 (PT)    Recommendations for Other Services       Precautions / Restrictions Precautions Precautions: Fall;Knee Precaution Comments: h/o multiple falls at home Restrictions Weight Bearing Restrictions: Yes RLE Weight Bearing: Weight bearing as tolerated    Mobility  Bed Mobility Overal bed mobility: Needs Assistance Bed Mobility: Supine to Sit     Supine to sit: Supervision;HOB elevated Sit to supine: Supervision   General bed mobility comments: incr effort and time, however pt able to perform with assist of rail, HOB elevated  Transfers Overall transfer level: Needs assistance Equipment used: Rolling walker (2 wheeled) Transfers: Sit to/from [facility] Sit to Stand: Mod assist;From elevated surface Stand pivot transfers: Min assist;From elevated surface       General transfer comment: pt insisted on not using RW for bed to [facility], however required vc for hand placement (pt tried to hold onto rolling bedside table); on return to bed (due to pt about to transfer to a new room) pt agreed to use RW and also needed vc for sequeincing  Ambulation/Gait Ambulation/Gait assistance: Min guard Ambulation Distance (Feet): 4 Feet Assistive device: Rolling  walker (2 wheeled) Gait Pattern/deviations: Step-to pattern;Decreased stride length Gait velocity: Decreased   General Gait Details: Limited by need to return to bed for transfer to his new room by nursing.   Stairs            Wheelchair Mobility    Modified Rankin (Stroke Patients Only)       Balance     Sitting balance-Leahy Scale: Fair       Standing balance-Leahy Scale: Poor                      Cognition Arousal/Alertness: Awake/alert Behavior During Therapy: WFL for tasks assessed/performed Overall Cognitive Status: Within Functional Limits for tasks assessed                      Exercises Total Joint Exercises Ankle Circles/Pumps: 10 reps Quad Sets: 15 reps Short Arc Quad: 10 reps Heel Slides: Strengthening;AAROM;Right;10 reps (assisted flexion; resisted extension) Straight Leg Raises: 10 reps    General Comments        Pertinent Vitals/Pain See above; denied pain    Home Living                      Prior Function            PT Goals (current goals can now be found in the care plan section) Acute Rehab PT Goals Patient Stated Goal: Wants to get better PT Goal Formulation: With patient Time For Goal Achievement: 11/23/13 Potential to Achieve Goals: Fair Progress towards PT goals: Progressing toward goals    Frequency  Min  3X/week    PT Plan Current plan remains appropriate    Co-evaluation             End of Session Equipment Utilized During Treatment: Gait belt Activity Tolerance: Patient tolerated treatment well Patient left: with call bell/phone within reach;in bed     Time: 1131-1159 PT Time Calculation (min): 28 min  Charges:  $Gait Training: 8-22 mins $Therapeutic Exercise: 8-22 mins                    G Codes:      Zachary Vargas Zachary Vargas 15-Nov-2013, 12:10 PM Pager [phone]

## 2013-11-12 NOTE — Progress Notes (Signed)
Pt transferred to 5W. Unit CSW provided a handoff. CSW called pt's wife to inform her of room change to 5W19. This CSW signing off.   Ky Barban, MSW, [place] Clinical Social Worker

## 2013-11-12 NOTE — Progress Notes (Signed)
KINGSLEY FARACE [id number]  Transfer Data: 11/12/2013 1:10 PM  Attending Provider: Cherene Altes, MD  PPI:RJJOACZ,YSAY CHARLES, MD  Code Status: Full  Zachary Vargas is a 80 y.o. male patient transferred from 2c  -No acute distress noted.  -No complaints of shortness of breath.  -No complaints of chest pain. ?  IV Fluids: IV in place, occlusive dsg intact without redness, IV cath forearm right, condition patent and no redness  none.  Allergies: Morphine and related; Statins; and Allopurinol  Past Medical History:  has a past medical history of Glaucoma; Dyslipidemia; Gout; BPH (benign prostatic hyperplasia); History of peptic ulcer disease; Peptic ulcer disease; Diverticulosis; Lumbar spinal stenosis; Carotid atherosclerosis; Peripheral arterial disease; Epigastric pain; Arthritis; Myeloma; Headache(784.0); Constipation; Urinary frequency; Anxiety; Insomnia; Anemia; Osteomyelitis; Hypertension; Peripheral edema; Pneumonia; Peripheral neuropathy; Joint pain; Joint swelling; Chronic back pain; Chronic kidney disease; Hypothyroidism; GERD (gastroesophageal reflux disease); Depression; Gout; Diabetes mellitus; Cataract; and Substance abuse.  Past Surgical History:  has past surgical history that includes Abdominal aortic aneurysm repair (03/1998); Abdominal hernia repair (2000); renal stone surgery (1999); nerve root block; Colonoscopy (07/22/2004); Eye surgery; Amputation (Left, 11/29/2012); Amputation (Left, 01/03/2013); Tonsillectomy; Vasectomy; Amputation (Left, 03/19/2013); Esophagogastroduodenoscopy; Total knee arthroplasty (Right, 10/15/2013); and Hernia repair (N/A, 2000).  Social History:  reports that he has quit smoking. He has never used smokeless tobacco. He reports that he drinks alcohol. He reports that he does not use illicit drugs.   Patient/Family orientated to room. Information packet given to patient/family. Admission inpatient armband information verified with patient/family to include  name and date of birth and placed on patient arm. Side rails up x 2, fall assessment and education completed with patient/family. Patient/family able to verbalize understanding of risk associated with falls and verbalized understanding to call for assistance before getting out of bed. Call light within reach. Patient/family able to voice and demonstrate understanding of unit orientation instructions.  Will continue to evaluate and treat per MD orders.

## 2013-11-12 NOTE — Progress Notes (Signed)
CSW provided support to pt, who states he is ready to get out of the hospital and was informed by the MD that he will transfer off this unit to med-surg and is ready for discharge today. Pt's wife called CSW and explained she will not get to town to do paperwork until 4:30pm--CSW encouraged wife to call admissions with [facility] and talk with her about arranging a time to meet this afternoon/coordinating with facility to either complete paperwork over the phone and complete remainder in person. CSW following and will update pt's new unit CSW if he transfers and will continue to coordinate with his wife.   Ky Barban, MSW, [facility] Clinical Social Worker [phone]

## 2013-11-13 LAB — CBC
HEMATOCRIT: 28.5 % — AB (ref 39.0–52.0)
HEMOGLOBIN: 9.3 g/dL — AB (ref 13.0–17.0)
MCH: 30.5 pg (ref 26.0–34.0)
MCHC: 32.6 g/dL (ref 30.0–36.0)
MCV: 93.4 fL (ref 78.0–100.0)
Platelets: 234 10*3/uL (ref 150–400)
RBC: 3.05 MIL/uL — ABNORMAL LOW (ref 4.22–5.81)
RDW: 15.2 % (ref 11.5–15.5)
WBC: 6.6 10*3/uL (ref 4.0–10.5)

## 2013-11-13 LAB — BASIC METABOLIC PANEL
BUN: 16 mg/dL (ref 6–23)
CO2: 23 mEq/L (ref 19–32)
CREATININE: 1.26 mg/dL (ref 0.50–1.35)
Calcium: 9.1 mg/dL (ref 8.4–10.5)
Chloride: 109 mEq/L (ref 96–112)
GFR calc Af Amer: 59 mL/min — ABNORMAL LOW (ref >90)
GFR, EST NON AFRICAN AMERICAN: 51 mL/min — AB (ref >90)
GLUCOSE: 118 mg/dL — AB (ref 70–99)
Potassium: 4 mEq/L (ref 3.7–5.3)
Sodium: 143 mEq/L (ref 137–147)

## 2013-11-13 LAB — GLUCOSE, CAPILLARY
Glucose-Capillary: 121 mg/dL — ABNORMAL HIGH (ref 70–99)
Glucose-Capillary: 150 mg/dL — ABNORMAL HIGH (ref 70–99)

## 2013-11-13 MED ORDER — FUROSEMIDE 40 MG PO TABS: 20.0000 mg | ORAL_TABLET | Freq: Every day | ORAL | Status: DC

## 2013-11-13 MED ORDER — GLUCERNA SHAKE PO LIQD: 237.0000 mL | Freq: Three times a day (TID) | ORAL | Status: DC

## 2013-11-13 MED ORDER — ZOLPIDEM TARTRATE 5 MG PO TABS: 5.0000 mg | ORAL_TABLET | Freq: Every evening | ORAL | Status: DC | PRN

## 2013-11-13 MED ORDER — DOXYCYCLINE HYCLATE 100 MG PO TABS: 100.0000 mg | ORAL_TABLET | Freq: Two times a day (BID) | ORAL | Status: DC

## 2013-11-13 MED ORDER — HYDROCODONE-ACETAMINOPHEN 5-325 MG PO TABS
1.0000 | ORAL_TABLET | Freq: Four times a day (QID) | ORAL | Status: DC | PRN
Start: 2013-11-13 — End: 2013-12-01

## 2013-11-13 NOTE — Progress Notes (Signed)
LATE ENTRY  Clinical Social Work Department BRIEF PSYCHOSOCIAL ASSESSMENT 11/13/2013  Patient:  Zachary Vargas, STRENGTH     Account Number:  [id number]     Admit date:  11/08/2013  Clinical Social Worker:  Freeman Caldron  Date/Time:  11/13/2013 03:10 PM  Referred by:  Physician  Date Referred:  11/12/2013 Referred for  SNF Placement   Other Referral:   Interview type:  Patient Other interview type:    PSYCHOSOCIAL DATA Living Status:  FACILITY Admitted from facility:  [place] Level of care:  [place] Primary support name:  Matyas Baisley Primary support relationship to patient:  SPOUSE Degree of support available:   Fair--pt states wife lives in [place] and he feels like his family relationships have been tumultuous recently.    CURRENT CONCERNS Current Concerns  Post-Acute Placement   Other Concerns:    SOCIAL WORK ASSESSMENT / PLAN CSW introduced self and explained role in discharge. Pt states his wife lives in [place] but is coming to [place] to help with discharge planning. Pt was at [facility] but states his wife does not want him to return and would like to explore other placement options. Wife selected [facility] because she can visit pt at this facility. CSW provided emotional support to pt, who states he feels overwhelmed by loss of physical functioning and separation from wife. Wife filling out paperwork at [facility] and plan is for dc to this facility.   Assessment/plan status:  Psychosocial Support/Ongoing Assessment of Needs Other assessment/ plan:   Information/referral to community resources:   SNF    PATIENT'S/FAMILY'S RESPONSE TO PLAN OF CARE: Good--pt engaged in conversation with CSW and understands CSW role. Pt thanked CSW for assistance.       Ky Barban, MSW, [facility] Clinical Social Worker [phone]

## 2013-11-13 NOTE — Progress Notes (Signed)
Patient was discharged to a nursing home by MD order; discharged instructions  review and sent to facility  with care notes and prescriptions; IV DIC; skin intact; facility [facility]) was called and report was given to the nurse; patient will be transported to nursing home via EMS. Marland Kitchen

## 2013-11-13 NOTE — Discharge Summary (Signed)
PATIENT DETAILS Name: Zachary Vargas Age: 80 y.o. Sex: male Date of Birth: Jul 07, 1927 MRN: [id number]. Admit Date: 11/08/2013 Admitting Physician: Jonetta Osgood, MD DTO:[email or web address],KDXI CHARLES, MD  Recommendations for Outpatient Follow-up:  1. Recheck CBC and BMET in one week. 2. Needs to follow up with Dr Sharol Given in 1 week  PRIMARY DISCHARGE DIAGNOSIS:  Principal Problem:   Encephalopathy acute Active Problems:   Type II or unspecified type diabetes mellitus without mention of complication, not stated as uncontrolled   Depression   Hypothyroidism   Anemia, unspecified   Chronic gouty arthropathy with tophus (tophi)   Total knee replacement status   Sepsis   Acute-on-chronic renal failure      PAST MEDICAL HISTORY: Past Medical History  Diagnosis Date  . Glaucoma     uses Eye Drops daily  . Dyslipidemia     statin intolerant  . Gout     takes Colchicine and Uloric daily  . BPH (benign prostatic hyperplasia)     [facility]  . History of peptic ulcer disease   . Peptic ulcer disease   . Diverticulosis   . Lumbar spinal stenosis     Most recent MRI 12/ 2011; [facility] - Dr. Nelva Bush  . Carotid atherosclerosis     Dopplers 11/09 showing 50-69% stenosis  . Peripheral arterial disease      health care consult 1/08  . Epigastric pain     Hospitalization 7/11 [place]  . Arthritis     Followed by [facility]  . Myeloma     IgA related; Dr. Marin Olp - 2009  . Headache(784.0)     rarely  . Constipation     but doesn't take any meds  . Urinary frequency   . Anxiety     takes Paxil daily  . Insomnia     takes Ambien nightly   . Anemia     of chronic disease;takes Ferrous Gluconate daily  . Osteomyelitis   . Hypertension     takes Lisinopril daily  . Peripheral edema     takes Furosemide daily  . Pneumonia     hx of-about 39yrs ago  . Peripheral neuropathy   . Joint pain   . Joint swelling   . Chronic back pain     spinal stenosis  .  Chronic kidney disease     not on dialysis  . Hypothyroidism     takes Synthroid daily  . GERD (gastroesophageal reflux disease)     takes Protonix daily  . Depression   . Gout   . Diabetes mellitus     takes glipizide daily, Type 2  . Cataract   . Substance abuse     alcohol    DISCHARGE MEDICATIONS:   Medication List    STOP taking these medications       hydrOXYzine 25 MG tablet  Commonly known as:  ATARAX/VISTARIL     moxifloxacin 400 MG tablet  Commonly known as:  AVELOX      TAKE these medications       acetaminophen 500 MG tablet  Commonly known as:  TYLENOL  Take 1 tablet (500 mg total) by mouth every 6 (six) hours as needed for mild pain.     aspirin EC 81 MG tablet  Take 1 tablet (81 mg total) by mouth daily.     cholecalciferol 1000 UNITS tablet  Commonly known as:  VITAMIN D  Take 1,000 Units by mouth daily.     COLCRYS 0.6  MG tablet  Generic drug:  colchicine  Take 0.6 mg by mouth daily.     dorzolamide-timolol 22.3-6.8 MG/ML ophthalmic solution  Commonly known as:  COSOPT  Place 1 drop into both eyes 2 (two) times daily.     doxycycline 100 MG tablet  Commonly known as:  VIBRA-TABS  Take 1 tablet (100 mg total) by mouth 2 (two) times daily. x5days, starting on 11/13/13     febuxostat 40 MG tablet  Commonly known as:  ULORIC  Take 40 mg by mouth daily.     feeding supplement (GLUCERNA SHAKE) Liqd  Take 237 mLs by mouth 3 (three) times daily between meals.     ferrous gluconate 325 MG tablet  Commonly known as:  FERGON  Take 325 mg by mouth daily with breakfast.     Fish Oil 1000 MG Caps  Take 1,000 mg by mouth daily.     furosemide 40 MG tablet  Commonly known as:  LASIX  Take 0.5 tablets (20 mg total) by mouth daily.     glipiZIDE 10 MG tablet  Commonly known as:  GLUCOTROL  Take 5 mg by mouth daily before breakfast.     HYDROcodone-acetaminophen 5-325 MG per tablet  Commonly known as:  NORCO/VICODIN  Take 1 tablet by mouth  every 6 (six) hours as needed for moderate pain.     latanoprost 0.005 % ophthalmic solution  Commonly known as:  XALATAN  Place 1 drop into the right eye at bedtime.     levothyroxine 50 MCG tablet  Commonly known as:  SYNTHROID, LEVOTHROID  Take 50 mcg by mouth daily before breakfast.     lisinopril 10 MG tablet  Commonly known as:  PRINIVIL,ZESTRIL  Take 1 tablet (10 mg total) by mouth daily.     multivitamin with minerals tablet  Take 1 tablet by mouth daily.     nortriptyline 50 MG capsule  Commonly known as:  PAMELOR  Take 1 capsule (50 mg total) by mouth at bedtime.     pantoprazole 20 MG tablet  Commonly known as:  PROTONIX  Take 1 tablet (20 mg total) by mouth daily.     PARoxetine 40 MG tablet  Commonly known as:  PAXIL  Take 40 mg by mouth every morning.     saccharomyces boulardii 250 MG capsule  Commonly known as:  FLORASTOR  Take 250 mg by mouth daily.     zolpidem 5 MG tablet  Commonly known as:  AMBIEN  Take 1 tablet (5 mg total) by mouth at bedtime as needed for sleep.        ALLERGIES:   Allergies  Allergen Reactions  . Morphine And Related Hives and Shortness Of Breath  . Statins Hives    Pt notes that "all" classes of cholesterol meds do the same   . Allopurinol Hives, Swelling and Rash    BRIEF HPI:  See H&P, Labs, Consult and Test reports for all details in brief,80 y.o. M w/ a hx of recent R total knee replacement who was undergoing rehab at a SNF following his May 10th discharge. His post operative course was complicated by the development of acute blood loss anemia. He presented to the ED with altered mental status. Apparently at the SNF the patient was diagnosed with pneumonia and started on Avelox. On arrival to the emergency room, patient was noted to be hypotensive and hypothermic. Chest x-ray was suggestive of chronic changes but could not rule out pneumonia. Urine analysis was negative  for UTI.  CONSULTATIONS:   general  surgery  PERTINENT RADIOLOGIC STUDIES: Ct Head Wo Contrast  11/08/2013   CLINICAL DATA:  Altered mental status  EXAM: CT HEAD WITHOUT CONTRAST  TECHNIQUE: Contiguous axial images were obtained from the base of the skull through the vertex without intravenous contrast.  COMPARISON:  None.  FINDINGS: The bony calvarium is intact. No gross soft tissue abnormality is noted. Atrophic changes and chronic white matter ischemic change is identified. No findings to suggest acute hemorrhage, acute infarction or space-occupying mass lesion are noted.  IMPRESSION: Chronic changes without acute abnormality.   Electronically Signed   By: Inez Catalina M.D.   On: 11/08/2013 07:54   Ct Chest Wo Contrast  11/08/2013   CLINICAL DATA:  Cough and hypotension  EXAM: CT CHEST WITHOUT CONTRAST  TECHNIQUE: Multidetector CT imaging of the chest was performed following the standard protocol without IV contrast.  COMPARISON:  11/08/2013, 11/29/2012, 04/29/2012 and 10/15/2009  FINDINGS: The lungs are again well aerated bilaterally. Diffuse fibrotic changes are again seen with some associated calcifications most consistent with prior granulomatous disease. No focal confluent infiltrate is identified. The overall appearance is similar to that seen on prior CT examinations. No sizable effusion is seen.  The hilar and mediastinal structures show no significant lymphadenopathy. Heavy coronary calcifications are noted. Aortic calcifications without aneurysmal dilatation are seen. The visualized upper abdomen is within normal limits. The osseous structures show degenerative change of the lumbar spine as well as prior right rib fractures. .  IMPRESSION: Chronic changes in both lungs most consistent with prior granulomatous disease. The overall appearance is stable from previous CTs dating back to 2011.  No acute infiltrate is seen.  Chronic changes without acute abnormality.   Electronically Signed   By: Inez Catalina M.D.   On: 11/08/2013 08:02    Dg Chest Port 1 View  11/08/2013   CLINICAL DATA:  Cough, altered mental status, hypotension  EXAM: PORTABLE CHEST - 1 VIEW  COMPARISON:  Prior radiograph from 11/29/2012  FINDINGS: Cardiomegaly is stable as compared to prior study. Atherosclerotic calcifications present within the aortic arch.  Lungs are mildly hypoinflated with fibrotic lung changes again seen. Superimposed irregular interstitial opacities are seen within the right mid and lower lung, suspicious for possible superimposed infection. Additional increased interstitial opacity seen within the left lung base as well. No pulmonary edema or definite pleural effusion. No pneumothorax.  No acute osseous abnormality. Prominent degenerative changes noted at the right shoulder.  IMPRESSION: 1. Chronic fibrotic lung changes with increased interstitial opacities within the right mid lung and bilateral lung bases. While findings may reflect progressive fibrosis, possible superimposed infection could be considered in the correct clinical setting. 2. Stable cardiomegaly.   Electronically Signed   By: Jeannine Boga M.D.   On: 11/08/2013 05:48     PERTINENT LAB RESULTS: CBC:  Recent Labs  11/12/13 0230 11/13/13 0509  WBC 6.3 6.6  HGB 8.4* 9.3*  HCT 25.2* 28.5*  PLT 236 234   CMET CMP     Component Value Date/Time   NA 143 11/13/2013 0509   K 4.0 11/13/2013 0509   CL 109 11/13/2013 0509   CO2 23 11/13/2013 0509   GLUCOSE 118* 11/13/2013 0509   BUN 16 11/13/2013 0509   CREATININE 1.26 11/13/2013 0509   CREATININE 1.86* 06/23/2013 1141   CALCIUM 9.1 11/13/2013 0509   PROT 6.2 11/08/2013 0520   ALBUMIN 2.8* 11/08/2013 0520   AST 20 11/08/2013 0520  ALT 10 11/08/2013 0520   ALKPHOS 113 11/08/2013 0520   BILITOT 0.3 11/08/2013 0520   GFRNONAA 51* 11/13/2013 0509   GFRAA 59* 11/13/2013 0509    GFR Estimated Creatinine Clearance: 62.4 ml/min (by C-G formula based on Cr of 1.26). No results found for this basename: LIPASE, AMYLASE,  in the last 72  hours No results found for this basename: CKTOTAL, CKMB, CKMBINDEX, TROPONINI,  in the last 72 hours No components found with this basename: POCBNP,  No results found for this basename: DDIMER,  in the last 72 hours No results found for this basename: HGBA1C,  in the last 72 hours No results found for this basename: CHOL, HDL, LDLCALC, TRIG, CHOLHDL, LDLDIRECT,  in the last 72 hours  Recent Labs  11/11/13 1932  TSH 12.480*   No results found for this basename: VITAMINB12, FOLATE, FERRITIN, TIBC, IRON, RETICCTPCT,  in the last 72 hours Coags: No results found for this basename: PT, INR,  in the last 72 hours Microbiology: Recent Results (from the past 240 hour(s))  CULTURE, BLOOD (ROUTINE X 2)     Status: None   Collection Time    11/08/13  5:12 AM      Result Value Ref Range Status   Specimen Description BLOOD LEFT ARM   Final   Special Requests BOTTLES DRAWN AEROBIC AND ANAEROBIC 5CC   Final   Culture  Setup Time     Final   Value: 11/08/2013 11:23     Performed at [facility]   Culture     Final   Value:        BLOOD CULTURE RECEIVED NO GROWTH TO DATE CULTURE WILL BE HELD FOR 5 DAYS BEFORE ISSUING A FINAL NEGATIVE REPORT     Performed at [facility]   Report Status PENDING   Incomplete  CULTURE, BLOOD (ROUTINE X 2)     Status: None   Collection Time    11/08/13  5:20 AM      Result Value Ref Range Status   Specimen Description BLOOD RIGHT ARM   Final   Special Requests BOTTLES DRAWN AEROBIC AND ANAEROBIC 5CC   Final   Culture  Setup Time     Final   Value: 11/08/2013 11:23     Performed at [facility]   Culture     Final   Value:        BLOOD CULTURE RECEIVED NO GROWTH TO DATE CULTURE WILL BE HELD FOR 5 DAYS BEFORE ISSUING A FINAL NEGATIVE REPORT     Performed at [facility]   Report Status PENDING   Incomplete  URINE CULTURE     Status: None   Collection Time    11/08/13  5:25 AM      Result Value Ref Range Status   Specimen  Description URINE, CATHETERIZED   Final   Special Requests NONE   Final   Culture  Setup Time     Final   Value: 11/08/2013 11:24     Performed at [place]     Final   Value: 2,000 COLONIES/ML     Performed at [facility]   Culture     Final   Value: INSIGNIFICANT GROWTH     Performed at [facility]   Report Status 11/09/2013 FINAL   Final  MRSA PCR SCREENING     Status: None   Collection Time    11/08/13  4:49 PM  Result Value Ref Range Status   MRSA by PCR NEGATIVE  NEGATIVE Final   Comment:            The GeneXpert MRSA Assay (FDA     approved for NASAL specimens     only), is one component of a     comprehensive MRSA colonization     surveillance program. It is not     intended to diagnose MRSA     infection nor to guide or     monitor treatment for     MRSA infections.     BRIEF HOSPITAL COURSE:   Encephalopathy acute  Suspected toxic metabolic encephalopathy from worsening renal failure, and underlying sepsis - CT head negative - resolved - appears to have a component of dementia/ST memory deficits   Sepsis  chest x-ray/CT chest negative for pneumonia - UA not consistent with UTI - suspect that cellulitis in bilateral lower extremities was the culprit - however does have chronic skin changes - started on empiric Vanco/Zosyn - blood cultures negative,clinically improved, will change antibiotic to Doxycycline for 5 more days.  Acute-on-chronic renal failure Stage 3 Likely secondary to sepsis, and use of lasix + lisinopril - significantly improvied with volume resuscitation and avoidance of potential nephrotoxins - cont ACE I (resumed 6/2) but watch renal fnx closely - BUN/Scr at baseline   Metabolic Acidosis  secondary to renal failure - lactic acid normal - resolved   HTN  cont ACE I cautiously   Anemia, unspecified  Suspect anemia secondary to recent blood loss (operative) - was transfused 2 units of PRBC on 5/30 -  Hgb remains stable - no obvious GI loss - continue iron supplementation.   Recent Total Right Knee Replacement  appreciate Ortho input, no signs of knee infection, Unna compressive wrap applied on 5/30 -return to SNF for rehab at dc. Needs follow up with Dr Sharol Given in 1 week.  Chronic gouty arthropathy with tophi  Continue Uloric, held Colchicine on admission,  - if an acute flare occurs would start prednisone given CKD  ? Chronic LE edema  ?rationale for Lasix pre admit - no old ECHO to determine if has h/o CHF-possibly using in setting of CKD- since just started on ACEI on 6/2-reassess at SNF whether needs to be restarted on Lasix  Depression  Cautiously continue with Celexa and nortriptyline   Hypothyroidism  Continue levothyroxine  TSH =12 , Free T4 within normal limits  Type II or unspecified type diabetes mellitus without mention of complication, not stated as uncontrolled  Oral hypoglycemic agents held - CBGs remained controlled with SSI-resume Glipizide on discharge.   TODAY-DAY OF DISCHARGE:  Subjective:   Jamual Bass today has no headache,no chest abdominal pain,no new weakness tingling or numbness, feels much better wants to go home today.   Objective:   Blood pressure 146/72, pulse 82, temperature 97.9 F (36.6 C), temperature source Oral, resp. rate 15, height 6\' 3"  (1.905 m), weight 121.4 kg (267 lb 10.2 oz), SpO2 94.00%.  Intake/Output Summary (Last 24 hours) at 11/13/13 0956 Last data filed at 11/13/13 0500  Gross per 24 hour  Intake    210 ml  Output    850 ml  Net   -640 ml   Filed Weights   11/11/13 0404 11/12/13 0358 11/13/13 0447  Weight: 122 kg (268 lb 15.4 oz) 122 kg (268 lb 15.4 oz) 121.4 kg (267 lb 10.2 oz)    Exam Awake Alert, Oriented *3, No new F.N deficits, Normal  affect [place].AT,PERRAL Supple Neck,No JVD, No cervical lymphadenopathy appriciated.  Symmetrical Chest wall movement, Good air movement bilaterally, CTAB RRR,No Gallops,Rubs or new  Murmurs, No Parasternal Heave +ve B.Sounds, Abd Soft, Non tender, No organomegaly appriciated, No rebound -guarding or rigidity. No Cyanosis, Clubbing or edema, No new Rash or bruise  DISCHARGE CONDITION: Stable  DISPOSITION: SNF  DISCHARGE INSTRUCTIONS:    Activity:  As tolerated with Full fall precautions use walker/cane & assistance as needed  Diet recommendation: Diabetic Diet Heart Healthy diet       Discharge Instructions   Call MD for:  redness, tenderness, or signs of infection (pain, swelling, redness, odor or green/yellow discharge around incision site)    Complete by:  As directed      Diet - low sodium heart healthy    Complete by:  As directed      Increase activity slowly    Complete by:  As directed            Follow-up Information   Follow up with DUDA,MARCUS V, MD In 1 week.   Specialty:  Orthopedic Surgery   Contact information:   [place] [address] [phone]       Follow up with Wyatt Haste, MD. Schedule an appointment as soon as possible for a visit in 2 weeks. (after discharge from SNF)    Specialty:  Family Medicine   Contact information:   [place] [address] [phone]         Total Time spent on discharge equals 45 minutes.  Signed: Jonetta Osgood 11/13/2013 9:56 AM  **Disclaimer: This note may have been dictated with voice recognition software. Similar sounding words can inadvertently be transcribed and this note may contain transcription errors which may not have been corrected upon publication of note.**

## 2013-11-13 NOTE — Clinical Social Work Note (Signed)
Clinical Social Work Department CLINICAL SOCIAL WORK PLACEMENT NOTE 11/13/2013  Patient:  DEIGO, ALONSO  Account Number:  [id number] Admit date:  11/08/2013  Clinical Social Worker:  Lovey Newcomer  Date/time:  11/13/2013 03:08 PM  Clinical Social Work is seeking post-discharge placement for this patient at the following level of care:   SKILLED NURSING   (*CSW will update this form in Epic as items are completed)   11/13/2013  Patient/family provided with [place] Department of Clinical Social Work's list of facilities offering this level of care within the geographic area requested by the patient (or if unable, by the patient's family).  11/13/2013  Patient/family informed of their freedom to choose among providers that offer the needed level of care, that participate in Medicare, Medicaid or managed care program needed by the patient, have an available bed and are willing to accept the patient.  11/13/2013  Patient/family informed of [facility]' ownership interest in [facility], as well as of the fact that they are under no obligation to receive care at this facility.  PASARR submitted to EDS on 11/13/2013 PASARR number received from EDS on 11/13/2013  FL2 transmitted to all facilities in geographic area requested by pt/family on  11/13/2013 FL2 transmitted to all facilities within larger geographic area on 11/13/2013  Patient informed that his/her managed care company has contracts with or will negotiate with  certain facilities, including the following:     Patient/family informed of bed offers received:  11/13/2013 Patient chooses bed at [facility]ician recommends and patient chooses bed at    Patient to be transferred to [facility] on  11/13/2013 Patient to be transferred to facility by Ambulance  The following physician request were entered in Epic:   Additional Comments: Per MD  patient ready to DC to [facility]. Rn, patient, wife, and facility aware of DC. RN given number for report. DC packet on chart. Ambulance transport requested for patient. CSW signing off at this time.     Liz Beach MSW, [place], [place], [phone]

## 2013-11-14 LAB — CULTURE, BLOOD (ROUTINE X 2)
CULTURE: NO GROWTH
Culture: NO GROWTH

## 2013-11-18 ENCOUNTER — Telehealth: Payer: Self-pay | Admitting: Family Medicine

## 2013-11-29 ENCOUNTER — Inpatient Hospital Stay (HOSPITAL_COMMUNITY)
Admission: EM | Admit: 2013-11-29 | Discharge: 2013-12-01 | DRG: 378 | Disposition: A | Discharge status: Skilled Nursing Facility | Payer: Medicare Other | Attending: Internal Medicine | Admitting: Internal Medicine

## 2013-11-29 ENCOUNTER — Encounter (HOSPITAL_COMMUNITY): Payer: Self-pay | Admitting: Emergency Medicine

## 2013-11-29 DIAGNOSIS — Z794 Long term (current) use of insulin: Secondary | ICD-10-CM

## 2013-11-29 DIAGNOSIS — Z7982 Long term (current) use of aspirin: Secondary | ICD-10-CM

## 2013-11-29 DIAGNOSIS — E785 Hyperlipidemia, unspecified: Secondary | ICD-10-CM | POA: Diagnosis present

## 2013-11-29 DIAGNOSIS — K648 Other hemorrhoids: Secondary | ICD-10-CM | POA: Diagnosis present

## 2013-11-29 DIAGNOSIS — K626 Ulcer of anus and rectum: Secondary | ICD-10-CM | POA: Diagnosis present

## 2013-11-29 DIAGNOSIS — N189 Chronic kidney disease, unspecified: Secondary | ICD-10-CM

## 2013-11-29 DIAGNOSIS — F32A Depression, unspecified: Secondary | ICD-10-CM

## 2013-11-29 DIAGNOSIS — Z96659 Presence of unspecified artificial knee joint: Secondary | ICD-10-CM

## 2013-11-29 DIAGNOSIS — F3289 Other specified depressive episodes: Secondary | ICD-10-CM

## 2013-11-29 DIAGNOSIS — K644 Residual hemorrhoidal skin tags: Secondary | ICD-10-CM | POA: Diagnosis present

## 2013-11-29 DIAGNOSIS — M109 Gout, unspecified: Secondary | ICD-10-CM | POA: Diagnosis present

## 2013-11-29 DIAGNOSIS — Z79899 Other long term (current) drug therapy: Secondary | ICD-10-CM

## 2013-11-29 DIAGNOSIS — G8929 Other chronic pain: Secondary | ICD-10-CM | POA: Diagnosis present

## 2013-11-29 DIAGNOSIS — M129 Arthropathy, unspecified: Secondary | ICD-10-CM | POA: Diagnosis present

## 2013-11-29 DIAGNOSIS — D649 Anemia, unspecified: Secondary | ICD-10-CM | POA: Diagnosis present

## 2013-11-29 DIAGNOSIS — G629 Polyneuropathy, unspecified: Secondary | ICD-10-CM | POA: Diagnosis present

## 2013-11-29 DIAGNOSIS — I739 Peripheral vascular disease, unspecified: Secondary | ICD-10-CM | POA: Diagnosis present

## 2013-11-29 DIAGNOSIS — E1129 Type 2 diabetes mellitus with other diabetic kidney complication: Secondary | ICD-10-CM | POA: Diagnosis present

## 2013-11-29 DIAGNOSIS — K922 Gastrointestinal hemorrhage, unspecified: Secondary | ICD-10-CM | POA: Diagnosis present

## 2013-11-29 DIAGNOSIS — D62 Acute posthemorrhagic anemia: Secondary | ICD-10-CM

## 2013-11-29 DIAGNOSIS — Z833 Family history of diabetes mellitus: Secondary | ICD-10-CM

## 2013-11-29 DIAGNOSIS — F329 Major depressive disorder, single episode, unspecified: Secondary | ICD-10-CM | POA: Diagnosis present

## 2013-11-29 DIAGNOSIS — I129 Hypertensive chronic kidney disease with stage 1 through stage 4 chronic kidney disease, or unspecified chronic kidney disease: Secondary | ICD-10-CM | POA: Diagnosis present

## 2013-11-29 DIAGNOSIS — D500 Iron deficiency anemia secondary to blood loss (chronic): Secondary | ICD-10-CM | POA: Diagnosis present

## 2013-11-29 DIAGNOSIS — G609 Hereditary and idiopathic neuropathy, unspecified: Secondary | ICD-10-CM | POA: Diagnosis present

## 2013-11-29 DIAGNOSIS — K219 Gastro-esophageal reflux disease without esophagitis: Secondary | ICD-10-CM | POA: Diagnosis present

## 2013-11-29 DIAGNOSIS — M549 Dorsalgia, unspecified: Secondary | ICD-10-CM | POA: Diagnosis present

## 2013-11-29 DIAGNOSIS — K921 Melena: Principal | ICD-10-CM

## 2013-11-29 DIAGNOSIS — Z801 Family history of malignant neoplasm of trachea, bronchus and lung: Secondary | ICD-10-CM

## 2013-11-29 DIAGNOSIS — E039 Hypothyroidism, unspecified: Secondary | ICD-10-CM

## 2013-11-29 DIAGNOSIS — E119 Type 2 diabetes mellitus without complications: Secondary | ICD-10-CM | POA: Diagnosis present

## 2013-11-29 DIAGNOSIS — Z87891 Personal history of nicotine dependence: Secondary | ICD-10-CM

## 2013-11-29 LAB — CBC WITH DIFFERENTIAL/PLATELET
BASOS ABS: 0 10*3/uL (ref 0.0–0.1)
Basophils Absolute: 0 10*3/uL (ref 0.0–0.1)
Basophils Relative: 0 % (ref 0–1)
Basophils Relative: 0 % (ref 0–1)
EOS ABS: 1 10*3/uL — AB (ref 0.0–0.7)
EOS ABS: 1.1 10*3/uL — AB (ref 0.0–0.7)
Eosinophils Relative: 14 % — ABNORMAL HIGH (ref 0–5)
Eosinophils Relative: 16 % — ABNORMAL HIGH (ref 0–5)
HCT: 20.4 % — ABNORMAL LOW (ref 39.0–52.0)
HCT: 22.9 % — ABNORMAL LOW (ref 39.0–52.0)
HEMOGLOBIN: 7.3 g/dL — AB (ref 13.0–17.0)
Hemoglobin: 6.6 g/dL — CL (ref 13.0–17.0)
LYMPHS ABS: 1.9 10*3/uL (ref 0.7–4.0)
LYMPHS PCT: 26 % (ref 12–46)
Lymphocytes Relative: 26 % (ref 12–46)
Lymphs Abs: 1.9 10*3/uL (ref 0.7–4.0)
MCH: 30 pg (ref 26.0–34.0)
MCH: 29.6 pg (ref 26.0–34.0)
MCHC: 32.4 g/dL (ref 30.0–36.0)
MCHC: 31.9 g/dL (ref 30.0–36.0)
MCV: 92.7 fL (ref 78.0–100.0)
MCV: 92.7 fL (ref 78.0–100.0)
MONO ABS: 1.1 10*3/uL — AB (ref 0.1–1.0)
Monocytes Absolute: 1.2 10*3/uL — ABNORMAL HIGH (ref 0.1–1.0)
Monocytes Relative: 17 % — ABNORMAL HIGH (ref 3–12)
Monocytes Relative: 16 % — ABNORMAL HIGH (ref 3–12)
NEUTROS PCT: 42 % — AB (ref 43–77)
Neutro Abs: 3.1 10*3/uL (ref 1.7–7.7)
Neutro Abs: 3 10*3/uL (ref 1.7–7.7)
Neutrophils Relative %: 42 % — ABNORMAL LOW (ref 43–77)
PLATELETS: 312 10*3/uL (ref 150–400)
Platelets: 322 10*3/uL (ref 150–400)
RBC: 2.2 MIL/uL — ABNORMAL LOW (ref 4.22–5.81)
RBC: 2.47 MIL/uL — ABNORMAL LOW (ref 4.22–5.81)
RDW: 15 % (ref 11.5–15.5)
RDW: 14.8 % (ref 11.5–15.5)
WBC: 7.3 10*3/uL (ref 4.0–10.5)
WBC: 7.2 10*3/uL (ref 4.0–10.5)

## 2013-11-29 LAB — COMPREHENSIVE METABOLIC PANEL
ALT: 9 U/L (ref 0–53)
AST: 19 U/L (ref 0–37)
Albumin: 2.5 g/dL — ABNORMAL LOW (ref 3.5–5.2)
Alkaline Phosphatase: 108 U/L (ref 39–117)
BUN: 49 mg/dL — AB (ref 6–23)
CALCIUM: 9.1 mg/dL (ref 8.4–10.5)
CO2: 24 meq/L (ref 19–32)
Chloride: 104 mEq/L (ref 96–112)
Creatinine, Ser: 1.74 mg/dL — ABNORMAL HIGH (ref 0.50–1.35)
GFR, EST AFRICAN AMERICAN: 40 mL/min — AB (ref >90)
GFR, EST NON AFRICAN AMERICAN: 35 mL/min — AB (ref >90)
GLUCOSE: 128 mg/dL — AB (ref 70–99)
Potassium: 4 mEq/L (ref 3.7–5.3)
Sodium: 142 mEq/L (ref 137–147)
TOTAL PROTEIN: 5.8 g/dL — AB (ref 6.0–8.3)
Total Bilirubin: 0.3 mg/dL (ref 0.3–1.2)

## 2013-11-29 LAB — BASIC METABOLIC PANEL
BUN: 53 mg/dL — ABNORMAL HIGH (ref 6–23)
CALCIUM: 8.8 mg/dL (ref 8.4–10.5)
CO2: 24 mEq/L (ref 19–32)
Chloride: 99 mEq/L (ref 96–112)
Creatinine, Ser: 1.72 mg/dL — ABNORMAL HIGH (ref 0.50–1.35)
GFR calc Af Amer: 41 mL/min — ABNORMAL LOW (ref >90)
GFR, EST NON AFRICAN AMERICAN: 35 mL/min — AB (ref >90)
GLUCOSE: 113 mg/dL — AB (ref 70–99)
Potassium: 3.8 mEq/L (ref 3.7–5.3)
Sodium: 137 mEq/L (ref 137–147)

## 2013-11-29 LAB — PROTIME-INR
INR: 1.09 (ref 0.00–1.49)
PROTHROMBIN TIME: 13.9 s (ref 11.6–15.2)

## 2013-11-29 LAB — GLUCOSE, CAPILLARY
GLUCOSE-CAPILLARY: 122 mg/dL — AB (ref 70–99)
Glucose-Capillary: 110 mg/dL — ABNORMAL HIGH (ref 70–99)
Glucose-Capillary: 169 mg/dL — ABNORMAL HIGH (ref 70–99)

## 2013-11-29 LAB — ABO/RH: ABO/RH(D): O POS

## 2013-11-29 LAB — RETICULOCYTES
RBC.: 2.47 MIL/uL — ABNORMAL LOW (ref 4.22–5.81)
RETIC CT PCT: 3.3 % — AB (ref 0.4–3.1)
Retic Count, Absolute: 81.5 10*3/uL (ref 19.0–186.0)

## 2013-11-29 LAB — PREPARE RBC (CROSSMATCH)

## 2013-11-29 LAB — CBC
HEMATOCRIT: 25.4 % — AB (ref 39.0–52.0)
Hemoglobin: 8.3 g/dL — ABNORMAL LOW (ref 13.0–17.0)
MCH: 30.2 pg (ref 26.0–34.0)
MCHC: 32.7 g/dL (ref 30.0–36.0)
MCV: 92.4 fL (ref 78.0–100.0)
PLATELETS: 327 10*3/uL (ref 150–400)
RBC: 2.75 MIL/uL — ABNORMAL LOW (ref 4.22–5.81)
RDW: 14.7 % (ref 11.5–15.5)
WBC: 6.2 10*3/uL (ref 4.0–10.5)

## 2013-11-29 LAB — TSH: TSH: 8.2 u[IU]/mL — ABNORMAL HIGH (ref 0.350–4.500)

## 2013-11-29 LAB — I-STAT CG4 LACTIC ACID, ED: Lactic Acid, Venous: 0.56 mmol/L (ref 0.5–2.2)

## 2013-11-29 LAB — POC OCCULT BLOOD, ED: Fecal Occult Bld: POSITIVE — AB

## 2013-11-29 LAB — APTT: aPTT: 38 seconds — ABNORMAL HIGH (ref 24–37)

## 2013-11-29 MED ORDER — PAROXETINE HCL 20 MG PO TABS
40.0000 mg | ORAL_TABLET | Freq: Every day | ORAL | Status: DC
  Administered 2013-11-29 – 2013-12-01 (×3): 40 mg via ORAL
  Filled 2013-11-29 (×4): qty 2

## 2013-11-29 MED ORDER — LORATADINE 10 MG PO TABS
10.0000 mg | ORAL_TABLET | Freq: Every day | ORAL | Status: DC
  Administered 2013-11-29 – 2013-12-01 (×3): 10 mg via ORAL
  Filled 2013-11-29 (×4): qty 1

## 2013-11-29 MED ORDER — DORZOLAMIDE HCL-TIMOLOL MAL 2-0.5 % OP SOLN
1.0000 [drp] | Freq: Two times a day (BID) | OPHTHALMIC | Status: DC
  Administered 2013-11-29 – 2013-12-01 (×5): 1 [drp] via OPHTHALMIC
  Filled 2013-11-29: qty 10

## 2013-11-29 MED ORDER — SODIUM CHLORIDE 0.9 % IV SOLN
80.0000 mg | Freq: Once | INTRAVENOUS | Status: AC
  Administered 2013-11-29: 80 mg via INTRAVENOUS
  Filled 2013-11-29: qty 80

## 2013-11-29 MED ORDER — HYDROCODONE-ACETAMINOPHEN 5-325 MG PO TABS
1.0000 | ORAL_TABLET | Freq: Four times a day (QID) | ORAL | Status: DC | PRN
  Administered 2013-11-29 – 2013-11-30 (×4): 1 via ORAL
  Filled 2013-11-29 (×4): qty 1

## 2013-11-29 MED ORDER — NORTRIPTYLINE HCL 25 MG PO CAPS
50.0000 mg | ORAL_CAPSULE | Freq: Every day | ORAL | Status: DC
  Administered 2013-11-29 – 2013-11-30 (×2): 50 mg via ORAL
  Filled 2013-11-29 (×3): qty 2

## 2013-11-29 MED ORDER — FERROUS GLUCONATE 324 (38 FE) MG PO TABS
325.0000 mg | ORAL_TABLET | Freq: Every day | ORAL | Status: DC
  Administered 2013-11-29 – 2013-12-01 (×3): 325 mg via ORAL
  Filled 2013-11-29 (×4): qty 1

## 2013-11-29 MED ORDER — HYDRALAZINE HCL 50 MG PO TABS
50.0000 mg | ORAL_TABLET | Freq: Three times a day (TID) | ORAL | Status: DC
  Administered 2013-11-29 – 2013-12-01 (×8): 50 mg via ORAL
  Filled 2013-11-29 (×11): qty 1

## 2013-11-29 MED ORDER — LABETALOL HCL 5 MG/ML IV SOLN
5.0000 mg | INTRAVENOUS | Status: DC | PRN
  Filled 2013-11-29: qty 4

## 2013-11-29 MED ORDER — FUROSEMIDE 20 MG PO TABS
20.0000 mg | ORAL_TABLET | Freq: Every day | ORAL | Status: DC
  Administered 2013-11-29 – 2013-12-01 (×3): 20 mg via ORAL
  Filled 2013-11-29 (×3): qty 1

## 2013-11-29 MED ORDER — ZOLPIDEM TARTRATE 5 MG PO TABS
5.0000 mg | ORAL_TABLET | Freq: Every evening | ORAL | Status: DC | PRN
  Administered 2013-11-29 – 2013-11-30 (×2): 5 mg via ORAL
  Filled 2013-11-29 (×2): qty 1

## 2013-11-29 MED ORDER — INSULIN ASPART 100 UNIT/ML ~~LOC~~ SOLN: 0.0000 [IU] | Freq: Every day | SUBCUTANEOUS | Status: DC

## 2013-11-29 MED ORDER — ASPIRIN EC 81 MG PO TBEC: 81.0000 mg | DELAYED_RELEASE_TABLET | Freq: Every day | ORAL | Status: DC

## 2013-11-29 MED ORDER — LATANOPROST 0.005 % OP SOLN
1.0000 [drp] | Freq: Every day | OPHTHALMIC | Status: DC
  Administered 2013-11-29 – 2013-11-30 (×2): 1 [drp] via OPHTHALMIC
  Filled 2013-11-29: qty 2.5

## 2013-11-29 MED ORDER — SODIUM CHLORIDE 0.9 % IJ SOLN
3.0000 mL | Freq: Two times a day (BID) | INTRAMUSCULAR | Status: DC
  Administered 2013-11-29 (×3): 3 mL via INTRAVENOUS
  Administered 2013-11-30: 10:00:00 via INTRAVENOUS
  Administered 2013-11-30: 3 mL via INTRAVENOUS

## 2013-11-29 MED ORDER — LEVOTHYROXINE SODIUM 75 MCG PO TABS
75.0000 ug | ORAL_TABLET | Freq: Every day | ORAL | Status: DC
  Administered 2013-11-29 – 2013-12-01 (×3): 75 ug via ORAL
  Filled 2013-11-29 (×6): qty 1

## 2013-11-29 MED ORDER — ONDANSETRON HCL 4 MG PO TABS: 4.0000 mg | ORAL_TABLET | Freq: Four times a day (QID) | ORAL | Status: DC | PRN

## 2013-11-29 MED ORDER — INSULIN ASPART 100 UNIT/ML ~~LOC~~ SOLN
0.0000 [IU] | Freq: Three times a day (TID) | SUBCUTANEOUS | Status: DC
  Administered 2013-11-29: 2 [IU] via SUBCUTANEOUS
  Administered 2013-11-29: 1 [IU] via SUBCUTANEOUS
  Administered 2013-11-30: 2 [IU] via SUBCUTANEOUS

## 2013-11-29 MED ORDER — SACCHAROMYCES BOULARDII 250 MG PO CAPS
250.0000 mg | ORAL_CAPSULE | Freq: Every day | ORAL | Status: DC
  Administered 2013-11-29 – 2013-12-01 (×3): 250 mg via ORAL
  Filled 2013-11-29 (×3): qty 1

## 2013-11-29 MED ORDER — FEBUXOSTAT 40 MG PO TABS
40.0000 mg | ORAL_TABLET | Freq: Every day | ORAL | Status: DC
  Administered 2013-11-29 – 2013-12-01 (×3): 40 mg via ORAL
  Filled 2013-11-29 (×3): qty 1

## 2013-11-29 MED ORDER — ONDANSETRON HCL 4 MG/2ML IJ SOLN
4.0000 mg | Freq: Four times a day (QID) | INTRAMUSCULAR | Status: DC | PRN
  Administered 2013-11-30: 4 mg via INTRAVENOUS
  Filled 2013-11-29: qty 2

## 2013-11-29 MED ORDER — PANTOPRAZOLE SODIUM 40 MG IV SOLR
40.0000 mg | Freq: Two times a day (BID) | INTRAVENOUS | Status: DC
  Administered 2013-11-29 – 2013-12-01 (×6): 40 mg via INTRAVENOUS
  Filled 2013-11-29 (×7): qty 40

## 2013-11-29 NOTE — H&P (Signed)
[facility] History and Physical  Patient: Zachary Vargas  GHW:[id number]  DOB: 08-05-1927  DOS: the patient was seen and examined on 11/29/2013 PCP: Wyatt Haste, MD  Chief Complaint: Anemia  HPI: Zachary Vargas is a 80 y.o. male with Past medical history of peptic ulcer disease, peripheral vascular disease, hypertension, diabetes, recent total knee arthroplasty, hypothyroidism. Patient presented with complaints of blood in the bowels. Patient was sent here from the nursing home with the complaint. After the episode of blood in the bowels they check his hemoglobin and it was 6.5 and therefore he was sent here. Patient denies any bleeding seen anywhere. Patient appears to be poor historian. Denies any complaint of chest pain, shortness of breath, cough, fever, chills, abdominal pain, acid reflux, nausea, vomiting, fall. He has some generalized weakness over last few days. He was on doxycycline until lately. He continues to be on aspirin 81 mg. Not on anticoagulation at present.  The patient is coming from nursing home. And at his baseline independent for most of his ADL.  Review of Systems: as mentioned in the history of present illness.  A Comprehensive review of the other systems is negative.  Past Medical History  Diagnosis Date  . Glaucoma     uses Eye Drops daily  . Dyslipidemia     statin intolerant  . Gout     takes Colchicine and Uloric daily  . BPH (benign prostatic hyperplasia)     [facility]  . History of peptic ulcer disease   . Peptic ulcer disease   . Diverticulosis   . Lumbar spinal stenosis     Most recent MRI 12/ 2011; [facility] - Dr. Nelva Bush  . Carotid atherosclerosis     Dopplers 11/09 showing 50-69% stenosis  . Peripheral arterial disease     [facility] health care consult 1/08  . Epigastric pain     Hospitalization 7/11 [facility]  . Arthritis     Followed by [facility]  . Myeloma     IgA related; Dr. Marin Olp - 2009  .  Headache(784.0)     rarely  . Constipation     but doesn't take any meds  . Urinary frequency   . Anxiety     takes Paxil daily  . Insomnia     takes Ambien nightly   . Anemia     of chronic disease;takes Ferrous Gluconate daily  . Osteomyelitis   . Hypertension     takes Lisinopril daily  . Peripheral edema     takes Furosemide daily  . Pneumonia     hx of-about 34yrs ago  . Peripheral neuropathy   . Joint pain   . Joint swelling   . Chronic back pain     spinal stenosis  . Chronic kidney disease     not on dialysis  . Hypothyroidism     takes Synthroid daily  . GERD (gastroesophageal reflux disease)     takes Protonix daily  . Depression   . Gout   . Diabetes mellitus     takes glipizide daily, Type 2  . Cataract   . Substance abuse     alcohol   Past Surgical History  Procedure Laterality Date  . Abdominal aortic aneurysm repair  03/1998  . Abdominal hernia repair  2000  . Renal stone surgery  1999  . Nerve root block      Multiple for lumbar spine  . Colonoscopy  07/22/2004    due repeat 2011; Dr.  Teena Irani, [facility]  . Eye surgery      bil cataract surgery  . Amputation Left 11/29/2012    Procedure: Left Foot, Great Toe Amputation at MTP Joint;  Surgeon: Newt Minion, MD;  Location: [facility];  Service: Orthopedics;  Laterality: Left;  LEFT  Foot, Great Toe Amputation at MTP Joint  . Amputation Left 01/03/2013    Procedure: Left foot 2nd toe amputation at metatarsophalangeal;  Surgeon: Newt Minion, MD;  Location: [facility];  Service: Orthopedics;  Laterality: Left;  . Tonsillectomy    . Vasectomy    . Amputation Left 03/19/2013    Procedure: AMPUTATION DIGIT;  Surgeon: Newt Minion, MD;  Location: [facility];  Service: Orthopedics;  Laterality: Left;  Left foot Amputation 3, 4, 5th Toes at MTP joint  . Esophagogastroduodenoscopy    . Total knee arthroplasty Right 10/15/2013    Procedure: TOTAL KNEE ARTHROPLASTY;  Surgeon: Newt Minion, MD;  Location: [facility];   Service: Orthopedics;  Laterality: Right;  Right Total Knee Arthroplasty  . Hernia repair N/A 2000    abdominal   Social History:  reports that he has quit smoking. He has never used smokeless tobacco. He reports that he drinks alcohol. He reports that he does not use illicit drugs.  Allergies  Allergen Reactions  . Morphine And Related Hives and Shortness Of Breath  . Statins Hives    Pt notes that "all" classes of cholesterol meds do the same   . Allopurinol Hives, Swelling and Rash    Family History  Problem Relation Age of Onset  . Diabetes Father   . Glaucoma Father   . Cancer Brother     1 died of lung cancer, 1 of colon, 1 of different cancer  . Glaucoma Daughter   . Glaucoma Son     Prior to Admission medications   Medication Sig Start Date End Date Taking? Authorizing Provider  Amino Acids-Protein Hydrolys (FEEDING SUPPLEMENT, PRO-STAT SUGAR FREE 64,) LIQD Take 30 mLs by mouth 3 (three) times daily with meals.   Yes Historical Provider, MD  aspirin EC 81 MG tablet Take 1 tablet (81 mg total) by mouth daily. 10/17/13  Yes Newt Minion, MD  cholecalciferol (VITAMIN D) 1000 UNITS tablet Take 1,000 Units by mouth daily.   Yes Historical Provider, MD  colchicine 0.6 MG tablet Take 0.6 mg by mouth daily.   Yes Historical Provider, MD  docusate sodium (COLACE) 100 MG capsule Take 100 mg by mouth 2 (two) times daily.   Yes Historical Provider, MD  dorzolamide-timolol (COSOPT) 22.3-6.8 MG/ML ophthalmic solution Place 1 drop into both eyes 2 (two) times daily.    Yes Historical Provider, MD  febuxostat (ULORIC) 40 MG tablet Take 40 mg by mouth daily.   Yes Historical Provider, MD  ferrous gluconate (FERGON) 325 MG tablet Take 325 mg by mouth daily with breakfast.     Yes Historical Provider, MD  furosemide (LASIX) 40 MG tablet Take 0.5 tablets (20 mg total) by mouth daily. 11/13/13  Yes Shanker Kristeen Mans, MD  glipiZIDE (GLUCOTROL) 10 MG tablet Take 5 mg by mouth daily before  breakfast.   Yes Historical Provider, MD  hydrALAZINE (APRESOLINE) 50 MG tablet Take 50 mg by mouth 3 (three) times daily.   Yes Historical Provider, MD  HYDROcodone-acetaminophen (NORCO/VICODIN) 5-325 MG per tablet Take 1 tablet by mouth every 6 (six) hours as needed for moderate pain. 11/13/13  Yes Shanker Kristeen Mans, MD  insulin aspart (  NOVOLOG) 100 UNIT/ML injection Inject 2-11 Units into the skin 3 (three) times daily before meals. Per sliding scale   Yes Historical Provider, MD  latanoprost (XALATAN) 0.005 % ophthalmic solution Place 1 drop into the right eye at bedtime.     Yes Historical Provider, MD  levothyroxine (SYNTHROID, LEVOTHROID) 75 MCG tablet Take 75 mcg by mouth daily before breakfast.   Yes Historical Provider, MD  lisinopril (PRINIVIL,ZESTRIL) 10 MG tablet Take 30 mg by mouth daily.   Yes Historical Provider, MD  loratadine (CLARITIN) 10 MG tablet Take 10 mg by mouth daily.   Yes Historical Provider, MD  Multiple Vitamins-Minerals (MULTIVITAMIN WITH MINERALS) tablet Take 1 tablet by mouth daily.   Yes Historical Provider, MD  nortriptyline (PAMELOR) 50 MG capsule Take 1 capsule (50 mg total) by mouth at bedtime. 09/02/13  Yes Denita Lung, MD  Omega-3 Fatty Acids (FISH OIL) 1000 MG CAPS Take 1,000 mg by mouth daily.   Yes Historical Provider, MD  pantoprazole (PROTONIX) 20 MG tablet Take 1 tablet (20 mg total) by mouth daily. 05/07/13  Yes Blanchard Kelch, MD  PARoxetine (PAXIL) 40 MG tablet Take 40 mg by mouth every morning.   Yes Historical Provider, MD  saccharomyces boulardii (FLORASTOR) 250 MG capsule Take 250 mg by mouth daily.   Yes Historical Provider, MD  zolpidem (AMBIEN) 5 MG tablet Take 1 tablet (5 mg total) by mouth at bedtime as needed for sleep. 11/13/13  Yes Shanker Kristeen Mans, MD  acetaminophen (TYLENOL) 500 MG tablet Take 1 tablet (500 mg total) by mouth every 6 (six) hours as needed for mild pain. 10/17/13   Newt Minion, MD    Physical Exam: Filed Vitals:    11/29/13 0330 11/29/13 0400 11/29/13 0410 11/29/13 0500  BP: 155/48 148/56  150/47  Pulse: 85 83  86  Temp:   97.8 F (36.6 C) 97.3 F (36.3 C)  TempSrc:   Oral Oral  Resp: 17 17  21   Height:    6\' 3"  (1.905 m)  Weight:    115.8 kg (255 lb 4.7 oz)  SpO2: 88% 92%  92%    General: Alert, Awake and Oriented to Time, Place and Person. Appear in mild distress Eyes: PERRL ENT: Oral Mucosa clear moist. Neck: no JVD Cardiovascular: S1 and S2 Present, aortic systolic Murmur, Peripheral Pulses Present Respiratory: Bilateral Air entry equal and Decreased, Clear to Auscultation,  no Crackles,o wheezes Abdomen: Bowel Sound Present, Soft and Non tender Skin: no Rash Extremities:   bilateralPedal edema, no calf tenderness Neurologic: Grossly no focal neuro deficit. Labs on Admission:  CBC:  Recent Labs Lab 11/29/13 0129 11/29/13 0356  WBC 7.3 7.2  NEUTROABS 3.1 3.0  HGB 6.6* 7.3*  HCT 20.4* 22.9*  MCV 92.7 92.7  PLT 322 312    CMP     Component Value Date/Time   NA 142 11/29/2013 0356   K 4.0 11/29/2013 0356   CL 104 11/29/2013 0356   CO2 24 11/29/2013 0356   GLUCOSE 128* 11/29/2013 0356   BUN 49* 11/29/2013 0356   CREATININE 1.74* 11/29/2013 0356   CREATININE 1.86* 06/23/2013 1141   CALCIUM 9.1 11/29/2013 0356   PROT 5.8* 11/29/2013 0356   ALBUMIN 2.5* 11/29/2013 0356   AST 19 11/29/2013 0356   ALT 9 11/29/2013 0356   ALKPHOS 108 11/29/2013 0356   BILITOT 0.3 11/29/2013 0356   GFRNONAA 35* 11/29/2013 0356   GFRAA 40* 11/29/2013 0356   Assessment/Plan Principal Problem:  GI bleed Active Problems:   Type II or unspecified type diabetes mellitus without mention of complication, not stated as uncontrolled   Depression   Hypothyroidism   Chronic kidney disease, unspecified   Anemia, unspecified   Peripheral neuropathy   Total knee replacement status   1. GI bleed  patient presenting with complaints of anemia. He had blood in his bowels as per ED physician in the nursing home it  is unclear whether this was melena or bright red blood per rectum. Patient at present does not have any diarrhea here in the hospital nor has any vomiting or any bleeding anywhere. he appears hemodynamically stable. no abdominal tenderness on exam. He has remote history of upper GI bleeding as well as a diverticular bleeding. Present she will be kept n.p.o. except medications code well to IV Protonix every 12 hours, blood transmission is already getting. Follow H&H . Further workup for acute blood loss, he has recent history of total knee arthroplasty although not on anticoagulation at present.  2.Diabetes mellitus  Sliding scale   3.Hypertension  Continue hydralazine, Lasix  4.Mood disorder Continue antidepressants  Consults: Gastroenterology  DVT Prophylaxis: mechanical compression device Nutrition: n.p.o. except medications   Code Status:  full  Disposition: Admitted to inpatient in step-down unit.  Author: Berle Mull, MD [facility] Pager: [phone] 11/29/2013, 5:34 AM    If 7PM-7AM, please contact night-coverage [email or web address] Password [email or web address]

## 2013-11-29 NOTE — Progress Notes (Signed)
Patient seen and examined this morning. Somewhat of a poor historian but per EDP his FOBT grossly positive. Recent hospitalization with anemia. Discussed with Dr. Lenard Simmer who will see in consult.  Costin M. Cruzita Lederer, MD [facility] [phone]

## 2013-11-29 NOTE — ED Notes (Signed)
Pt arrives from [facility] facility via ems who reports pt had labs drawn at 11/28/13, dr sent pt here for low hgb "6.5" per labs

## 2013-11-29 NOTE — ED Notes (Signed)
Bed: XN23 Expected date:  Expected time:  Means of arrival:  Comments: EMS from NH needs blood transfusion

## 2013-11-29 NOTE — ED Provider Notes (Signed)
CSN: [id number]     Arrival date & time 11/29/13  0029 History   First MD Initiated Contact with Patient 11/29/13 0055     Chief Complaint  Patient presents with  . Abnormal Lab     (Consider location/radiation/quality/duration/timing/severity/associated sxs/prior Treatment) HPI Comments: 80 year old male with history of renal deficiency, depression, hypothyroidism, general weakness, neuropathy, peptic ulcer disease, myeloma IgA related per records, anemia, reflux, diabetes, prostate hypertrophy and current Foley presents from rehabilitation facility for abnormal lab. Patient had routine blood work done showing anemia hemoglobin 6.5. Patient denies recent bleeding however says he did have bleeding in the past but unsure the cause. Patient denies blood thinners however her medical records shows on aspirin. Patient had a recent surgery left knee replacement April 16 and is in rehabilitation for general weakness. Patient denies fevers chills vomiting or respiratory symptoms.  The history is provided by the patient.    Past Medical History  Diagnosis Date  . Glaucoma     uses Eye Drops daily  . Dyslipidemia     statin intolerant  . Gout     takes Colchicine and Uloric daily  . BPH (benign prostatic hyperplasia)     [facility]  . History of peptic ulcer disease   . Peptic ulcer disease   . Diverticulosis   . Lumbar spinal stenosis     Most recent MRI 12/ 2011; [facility] - Dr. Nelva Bush  . Carotid atherosclerosis     Dopplers 11/09 showing 50-69% stenosis  . Peripheral arterial disease     [place] [place] [facility] consult 1/08  . Epigastric pain     Hospitalization 7/11 [place]  . Arthritis     Followed by [facility]  . Myeloma     IgA related; Dr. Marin Olp - 2009  . Headache(784.0)     rarely  . Constipation     but doesn't take any meds  . Urinary frequency   . Anxiety     takes Paxil daily  . Insomnia     takes Ambien nightly   . Anemia     of chronic disease;takes  Ferrous Gluconate daily  . Osteomyelitis   . Hypertension     takes Lisinopril daily  . Peripheral edema     takes Furosemide daily  . Pneumonia     hx of-about 16yrs ago  . Peripheral neuropathy   . Joint pain   . Joint swelling   . Chronic back pain     spinal stenosis  . Chronic kidney disease     not on dialysis  . Hypothyroidism     takes Synthroid daily  . GERD (gastroesophageal reflux disease)     takes Protonix daily  . Depression   . Gout   . Diabetes mellitus     takes glipizide daily, Type 2  . Cataract   . Substance abuse     alcohol   Past Surgical History  Procedure Laterality Date  . Abdominal aortic aneurysm repair  03/1998  . Abdominal hernia repair  2000  . Renal stone surgery  1999  . Nerve root block      Multiple for lumbar spine  . Colonoscopy  07/22/2004    due repeat 2011; Dr. Teena Irani, [facility]  . Eye surgery      bil cataract surgery  . Amputation Left 11/29/2012    Procedure: Left Foot, Great Toe Amputation at MTP Joint;  Surgeon: Newt Minion, MD;  Location: [place];  Service: Orthopedics;  Laterality: Left;  LEFT  Foot, Great Toe Amputation at MTP Joint  . Amputation Left 01/03/2013    Procedure: Left foot 2nd toe amputation at metatarsophalangeal;  Surgeon: Newt Minion, MD;  Location: [place];  Service: Orthopedics;  Laterality: Left;  . Tonsillectomy    . Vasectomy    . Amputation Left 03/19/2013    Procedure: AMPUTATION DIGIT;  Surgeon: Newt Minion, MD;  Location: [place];  Service: Orthopedics;  Laterality: Left;  Left foot Amputation 3, 4, 5th Toes at MTP joint  . Esophagogastroduodenoscopy    . Total knee arthroplasty Right 10/15/2013    Procedure: TOTAL KNEE ARTHROPLASTY;  Surgeon: Newt Minion, MD;  Location: [place];  Service: Orthopedics;  Laterality: Right;  Right Total Knee Arthroplasty  . Hernia repair N/A 2000    abdominal   Family History  Problem Relation Age of Onset  . Diabetes Father   . Glaucoma Father   .  Cancer Brother     1 died of lung cancer, 1 of colon, 1 of different cancer  . Glaucoma Daughter   . Glaucoma Son    History  Substance Use Topics  . Smoking status: Former Research scientist (life sciences)  . Smokeless tobacco: Never Used     Comment: quit smoking 80yrs ago  . Alcohol Use: Yes     Comment: 4 oz drink daily    Review of Systems  Constitutional: Negative for fever and chills.  HENT: Negative for congestion.   Eyes: Negative for visual disturbance.  Respiratory: Negative for shortness of breath.   Cardiovascular: Negative for chest pain.  Gastrointestinal: Negative for vomiting and abdominal pain.  Genitourinary: Negative for dysuria and flank pain.  Musculoskeletal: Negative for back pain, neck pain and neck stiffness.  Skin: Negative for rash.  Neurological: Positive for weakness (Gen.). Negative for light-headedness and headaches.      Allergies  Morphine and related; Statins; and Allopurinol  Home Medications   Prior to Admission medications   Medication Sig Start Date End Date Taking? Authorizing Provider  acetaminophen (TYLENOL) 500 MG tablet Take 1 tablet (500 mg total) by mouth every 6 (six) hours as needed for mild pain. 10/17/13   Newt Minion, MD  aspirin EC 81 MG tablet Take 1 tablet (81 mg total) by mouth daily. 10/17/13   Newt Minion, MD  cholecalciferol (VITAMIN D) 1000 UNITS tablet Take 1,000 Units by mouth daily.    Historical Provider, MD  dorzolamide-timolol (COSOPT) 22.3-6.8 MG/ML ophthalmic solution Place 1 drop into both eyes 2 (two) times daily.     Historical Provider, MD  doxycycline (VIBRA-TABS) 100 MG tablet Take 1 tablet (100 mg total) by mouth 2 (two) times daily. O1IDCV, starting on 11/13/13 11/13/13   Jonetta Osgood, MD  febuxostat (ULORIC) 40 MG tablet Take 40 mg by mouth daily.    Historical Provider, MD  feeding supplement, GLUCERNA SHAKE, (GLUCERNA SHAKE) LIQD Take 237 mLs by mouth 3 (three) times daily between meals. 11/13/13   Shanker Kristeen Mans, MD   ferrous gluconate (FERGON) 325 MG tablet Take 325 mg by mouth daily with breakfast.      Historical Provider, MD  furosemide (LASIX) 40 MG tablet Take 0.5 tablets (20 mg total) by mouth daily. 11/13/13   Shanker Kristeen Mans, MD  glipiZIDE (GLUCOTROL) 10 MG tablet Take 5 mg by mouth daily before breakfast.    Historical Provider, MD  HYDROcodone-acetaminophen (NORCO/VICODIN) 5-325 MG per tablet Take 1 tablet by mouth every 6 (six) hours  as needed for moderate pain. 11/13/13   Shanker Kristeen Mans, MD  latanoprost (XALATAN) 0.005 % ophthalmic solution Place 1 drop into the right eye at bedtime.      Historical Provider, MD  levothyroxine (SYNTHROID, LEVOTHROID) 50 MCG tablet Take 50 mcg by mouth daily before breakfast.    Historical Provider, MD  lisinopril (PRINIVIL,ZESTRIL) 10 MG tablet Take 1 tablet (10 mg total) by mouth daily. 06/23/13   Denita Lung, MD  Multiple Vitamins-Minerals (MULTIVITAMIN WITH MINERALS) tablet Take 1 tablet by mouth daily.    Historical Provider, MD  nortriptyline (PAMELOR) 50 MG capsule Take 1 capsule (50 mg total) by mouth at bedtime. 09/02/13   Denita Lung, MD  Omega-3 Fatty Acids (FISH OIL) 1000 MG CAPS Take 1,000 mg by mouth daily.    Historical Provider, MD  pantoprazole (PROTONIX) 20 MG tablet Take 1 tablet (20 mg total) by mouth daily. 05/07/13   Blanchard Kelch, MD  PARoxetine (PAXIL) 40 MG tablet Take 40 mg by mouth every morning.    Historical Provider, MD  saccharomyces boulardii (FLORASTOR) 250 MG capsule Take 250 mg by mouth daily.    Historical Provider, MD  zolpidem (AMBIEN) 5 MG tablet Take 1 tablet (5 mg total) by mouth at bedtime as needed for sleep. 11/13/13   Shanker Kristeen Mans, MD   BP 136/57  Pulse 86  Temp(Src) 98.3 F (36.8 C) (Oral)  Resp 20  Ht 6\' 3"  (1.905 m)  Wt 260 lb (117.935 kg)  BMI 32.50 kg/m2  SpO2 97% Physical Exam  Nursing note and vitals reviewed. Constitutional: He is oriented to person, place, and time. He appears well-developed  and well-nourished.  HENT:  Head: Normocephalic and atraumatic.  Eyes: Right eye exhibits no discharge. Left eye exhibits no discharge.  Pale conj  Neck: Normal range of motion. Neck supple. No tracheal deviation present.  Cardiovascular: Normal rate and regular rhythm.   Pulmonary/Chest: Effort normal and breath sounds normal.  Abdominal: Soft. He exhibits no distension. There is no tenderness. There is no guarding.  Genitourinary:  Lighter gross blood rectum, no hemorhoids  Musculoskeletal: He exhibits edema (2+ bilateral lower extremities).  Neurological: He is alert and oriented to person, place, and time. GCS eye subscore is 4. GCS verbal subscore is 5. GCS motor subscore is 6.  Patient generally weak however moves all extremities equal bilateral. Pupils equal bilateral. At times I feel he does have mild confusion or perhaps she just does not know his medical history well as he denies most medical history questions however his medical record says otherwise. He is alert and oriented to place time date or name and recent events and surgery.  Skin: Skin is warm. Rash noted.  Patient has chronic skin changes in the legs with small areas of previous scarring and superficial ulcerations most note notably left calf area approximately 4 cm diameter with no surrounding erythema or induration.  Psychiatric: He has a normal mood and affect.    ED Course  Procedures (including critical care time) CRITICAL CARE Performed by: Mariea Clonts   Total critical care time: 30 min  Critical care time was exclusive of separately billable procedures and treating other patients.  Critical care was necessary to treat or prevent imminent or life-threatening deterioration.  Critical care was time spent personally by me on the following activities: development of treatment plan with patient and/or surrogate as well as nursing, discussions with consultants, evaluation of patient's response to treatment,  examination of  patient, obtaining history from patient or surrogate, ordering and performing treatments and interventions, ordering and review of laboratory studies, ordering and review of radiographic studies, pulse oximetry and re-evaluation of patient's condition.  Labs Review Labs Reviewed  BASIC METABOLIC PANEL - Abnormal; Notable for the following:    Glucose, Bld 113 (*)    BUN 53 (*)    Creatinine, Ser 1.72 (*)    GFR calc non Af [name] 35 (*)    GFR calc Af [name] 41 (*)    All other components within normal limits  CBC WITH DIFFERENTIAL - Abnormal; Notable for the following:    RBC 2.20 (*)    Hemoglobin 6.6 (*)    HCT 20.4 (*)    Neutrophils Relative % 42 (*)    Monocytes Relative 17 (*)    Monocytes Absolute 1.2 (*)    Eosinophils Relative 14 (*)    Eosinophils Absolute 1.0 (*)    All other components within normal limits  APTT - Abnormal; Notable for the following:    aPTT 38 (*)    All other components within normal limits  TSH - Abnormal; Notable for the following:    TSH 8.200 (*)    All other components within normal limits  CBC WITH DIFFERENTIAL - Abnormal; Notable for the following:    RBC 2.47 (*)    Hemoglobin 7.3 (*)    HCT 22.9 (*)    Neutrophils Relative % 42 (*)    Monocytes Relative 16 (*)    Monocytes Absolute 1.1 (*)    Eosinophils Relative 16 (*)    Eosinophils Absolute 1.1 (*)    All other components within normal limits  COMPREHENSIVE METABOLIC PANEL - Abnormal; Notable for the following:    Glucose, Bld 128 (*)    BUN 49 (*)    Creatinine, Ser 1.74 (*)    Total Protein 5.8 (*)    Albumin 2.5 (*)    GFR calc non Af [name] 35 (*)    GFR calc Af [name] 40 (*)    All other components within normal limits  RETICULOCYTES - Abnormal; Notable for the following:    Retic Ct Pct 3.3 (*)    RBC. 2.47 (*)    All other components within normal limits  GLUCOSE, CAPILLARY - Abnormal; Notable for the following:    Glucose-Capillary 122 (*)    All other  components within normal limits  CBC - Abnormal; Notable for the following:    RBC 2.75 (*)    Hemoglobin 8.3 (*)    HCT 25.4 (*)    All other components within normal limits  GLUCOSE, CAPILLARY - Abnormal; Notable for the following:    Glucose-Capillary 110 (*)    All other components within normal limits  GLUCOSE, CAPILLARY - Abnormal; Notable for the following:    Glucose-Capillary 169 (*)    All other components within normal limits  CBC - Abnormal; Notable for the following:    RBC 2.73 (*)    Hemoglobin 8.1 (*)    HCT 25.4 (*)    All other components within normal limits  BASIC METABOLIC PANEL - Abnormal; Notable for the following:    Glucose, Bld 110 (*)    BUN 30 (*)    GFR calc non Af [name] 50 (*)    GFR calc Af [name] 59 (*)    All other components within normal limits  GLUCOSE, CAPILLARY - Abnormal; Notable for the following:    Glucose-Capillary 100 (*)  All other components within normal limits  GLUCOSE, CAPILLARY - Abnormal; Notable for the following:    Glucose-Capillary 164 (*)    All other components within normal limits  GLUCOSE, CAPILLARY - Abnormal; Notable for the following:    Glucose-Capillary 118 (*)    All other components within normal limits  CBC - Abnormal; Notable for the following:    RBC 2.87 (*)    Hemoglobin 8.6 (*)    HCT 27.1 (*)    All other components within normal limits  BASIC METABOLIC PANEL - Abnormal; Notable for the following:    Glucose, Bld 107 (*)    GFR calc non Af [name] 52 (*)    GFR calc Af [name] 61 (*)    All other components within normal limits  GLUCOSE, CAPILLARY - Abnormal; Notable for the following:    Glucose-Capillary 104 (*)    All other components within normal limits  POC OCCULT BLOOD, ED - Abnormal; Notable for the following:    Fecal Occult Bld POSITIVE (*)    All other components within normal limits  PROTIME-INR  GLUCOSE, CAPILLARY  GLUCOSE, CAPILLARY  GLUCOSE, CAPILLARY  GLUCOSE, CAPILLARY  I-STAT CG4  LACTIC ACID, ED  TYPE AND SCREEN  PREPARE RBC (CROSSMATCH)  ABO/RH    Imaging Review No results found.   EKG Interpretation None      MDM   Final diagnoses:  Gastrointestinal hemorrhage with melena  Acute posthemorrhagic anemia  Chronic kidney disease, unspecified  Depression  Hypothyroidism, unspecified hypothyroidism type  Status post total knee replacement, unspecified laterality   Patient with general weakness currently in rehabilitation for strengthening from knee replacement presents with worsening anemia. Hemoccult done and grossly positive. Discussed results and benefits of receiving blood and I ordered 1 unit to be given in ED.  Patient agrees with plan and planning admission to the hospital. Urinalysis pending. Rechecks showed no significant bleeding, spoke with hospitalist for admission.  Patient has general weakness difficult to discern if this is from his comorbidities and recent surgery versus urine infection/blood loss anemia versus other.  The patients results and plan were reviewed and discussed.   Any x-rays performed were personally reviewed by myself.   Differential diagnosis were considered with the presenting HPI.  Medications - No data to display     Filed Vitals:   11/29/13 0039  BP: 136/57  Pulse: 86  Temp: 98.3 F (36.8 C)  TempSrc: Oral  Resp: 20  Height: 6\' 3"  (1.905 m)  Weight: 260 lb (117.935 kg)  SpO2: 97%    Admission/ observation were discussed with the admitting physician, patient and/or family and they are comfortable with the plan.     Mariea Clonts, MD 12/02/13 [phone]

## 2013-11-29 NOTE — Consult Note (Signed)
[facility] Consult Note  Referring Manuelita Moxon: No ref. Josefita Weissmann found Primary Care Physician:  Wyatt Haste, MD Primary Gastroenterologist:  Dr.  Laurel Dimmer Complaint: Anemia, blood in stool HPI: Zachary Vargas is an 80 y.o. white male  resident at the rehabilitation facility who apparently was noticed by his attendance to have some blood in his stool in the check hemoglobin was 6.5 and he was sent here. Since then he has had one stool that was described as green without any bright red maroon or black color to it by the nurse. He denies any abdominal pain nausea or vomiting. He has been ordered for blood transfusion. He is known to have had a tubular adenoma and sigmoid diverticulosis on colonoscopy in 2006. He declined followup colonoscopy in 2011 due to his age.  Past Medical History  Diagnosis Date  . Glaucoma     uses Eye Drops daily  . Dyslipidemia     statin intolerant  . Gout     takes Colchicine and Uloric daily  . BPH (benign prostatic hyperplasia)     [facility]  . History of peptic ulcer disease   . Peptic ulcer disease   . Diverticulosis   . Lumbar spinal stenosis     Most recent MRI 12/ 2011; [facility] - Dr. Nelva Bush  . Carotid atherosclerosis     Dopplers 11/09 showing 50-69% stenosis  . Peripheral arterial disease     [place] [facility] care consult 1/08  . Epigastric pain     Hospitalization 7/11 [place]  . Arthritis     Followed by [facility]  . Myeloma     IgA related; Dr. Marin Olp - 2009  . Headache(784.0)     rarely  . Constipation     but doesn't take any meds  . Urinary frequency   . Anxiety     takes Paxil daily  . Insomnia     takes Ambien nightly   . Anemia     of chronic disease;takes Ferrous Gluconate daily  . Osteomyelitis   . Hypertension     takes Lisinopril daily  . Peripheral edema     takes Furosemide daily  . Pneumonia     hx of-about 106yr ago  . Peripheral neuropathy   . Joint pain   . Joint swelling   .  Chronic back pain     spinal stenosis  . Chronic kidney disease     not on dialysis  . Hypothyroidism     takes Synthroid daily  . GERD (gastroesophageal reflux disease)     takes Protonix daily  . Depression   . Gout   . Diabetes mellitus     takes glipizide daily, Type 2  . Cataract   . Substance abuse     alcohol    Past Surgical History  Procedure Laterality Date  . Abdominal aortic aneurysm repair  03/1998  . Abdominal hernia repair  2000  . Renal stone surgery  1999  . Nerve root block      Multiple for lumbar spine  . Colonoscopy  07/22/2004    [facility]  . Eye surgery      bil cataract surgery  . Amputation Left 11/29/2012    Procedure: Left Foot, Great Toe Amputation at MTP Joint;  Surgeon: MNewt Minion MD;  [place] [facility]  Service: Orthopedics;  Laterality: Left;  LEFT  Foot, Great Toe Amputation at MTP Joint  . Amputation Left 01/03/2013  Procedure: Left foot 2nd toe amputation at metatarsophalangeal;  Surgeon: Newt Minion, MD;  Location: [place];  Service: Orthopedics;  Laterality: Left;  . Tonsillectomy    . Vasectomy    . Amputation Left 03/19/2013    Procedure: AMPUTATION DIGIT;  Surgeon: Newt Minion, MD;  Location: [place];  Service: Orthopedics;  Laterality: Left;  Left foot Amputation 3, 4, 5th Toes at MTP joint  . Esophagogastroduodenoscopy    . Total knee arthroplasty Right 10/15/2013    Procedure: TOTAL KNEE ARTHROPLASTY;  Surgeon: Newt Minion, MD;  Location: [place];  Service: Orthopedics;  Laterality: Right;  Right Total Knee Arthroplasty  . Hernia repair N/A 2000    abdominal    Medications Prior to Admission  Medication Sig Dispense Refill  . Amino Acids-Protein Hydrolys (FEEDING SUPPLEMENT, PRO-STAT SUGAR FREE 64,) LIQD Take 30 mLs by mouth 3 (three) times daily with meals.      Marland Kitchen aspirin EC 81 MG tablet Take 1 tablet (81 mg total) by mouth daily.  30 tablet  1  . cholecalciferol (VITAMIN D) 1000 UNITS  tablet Take 1,000 Units by mouth daily.      . colchicine 0.6 MG tablet Take 0.6 mg by mouth daily.      Marland Kitchen docusate sodium (COLACE) 100 MG capsule Take 100 mg by mouth 2 (two) times daily.      . dorzolamide-timolol (COSOPT) 22.3-6.8 MG/ML ophthalmic solution Place 1 drop into both eyes 2 (two) times daily.       . febuxostat (ULORIC) 40 MG tablet Take 40 mg by mouth daily.      . ferrous gluconate (FERGON) 325 MG tablet Take 325 mg by mouth daily with breakfast.        . furosemide (LASIX) 40 MG tablet Take 0.5 tablets (20 mg total) by mouth daily.  90 tablet  3  . glipiZIDE (GLUCOTROL) 10 MG tablet Take 5 mg by mouth daily before breakfast.      . hydrALAZINE (APRESOLINE) 50 MG tablet Take 50 mg by mouth 3 (three) times daily.      Marland Kitchen HYDROcodone-acetaminophen (NORCO/VICODIN) 5-325 MG per tablet Take 1 tablet by mouth every 6 (six) hours as needed for moderate pain.  30 tablet  0  . insulin aspart (NOVOLOG) 100 UNIT/ML injection Inject 2-11 Units into the skin 3 (three) times daily before meals. Per sliding scale      . latanoprost (XALATAN) 0.005 % ophthalmic solution Place 1 drop into the right eye at bedtime.        Marland Kitchen levothyroxine (SYNTHROID, LEVOTHROID) 75 MCG tablet Take 75 mcg by mouth daily before breakfast.      . lisinopril (PRINIVIL,ZESTRIL) 10 MG tablet Take 30 mg by mouth daily.      Marland Kitchen loratadine (CLARITIN) 10 MG tablet Take 10 mg by mouth daily.      . Multiple Vitamins-Minerals (MULTIVITAMIN WITH MINERALS) tablet Take 1 tablet by mouth daily.      . nortriptyline (PAMELOR) 50 MG capsule Take 1 capsule (50 mg total) by mouth at bedtime.  90 capsule  3  . Omega-3 Fatty Acids (FISH OIL) 1000 MG CAPS Take 1,000 mg by mouth daily.      . pantoprazole (PROTONIX) 20 MG tablet Take 1 tablet (20 mg total) by mouth daily.  30 tablet  0  . PARoxetine (PAXIL) 40 MG tablet Take 40 mg by mouth every morning.      . saccharomyces boulardii (FLORASTOR) 250 MG capsule Take 250  mg by mouth daily.       Marland Kitchen zolpidem (AMBIEN) 5 MG tablet Take 1 tablet (5 mg total) by mouth at bedtime as needed for sleep.  20 tablet  1  . acetaminophen (TYLENOL) 500 MG tablet Take 1 tablet (500 mg total) by mouth every 6 (six) hours as needed for mild pain.  30 tablet  0    Allergies:  Allergies  Allergen Reactions  . Morphine And Related Hives and Shortness Of Breath  . Statins Hives    Pt notes that "all" classes of cholesterol meds do the same   . Allopurinol Hives, Swelling and Rash    Family History  Problem Relation Age of Onset  . Diabetes Father   . Glaucoma Father   . Cancer Brother     1 died of lung cancer, 1 of colon, 1 of different cancer  . Glaucoma Daughter   . Glaucoma Son     Social History:  reports that he has quit smoking. He has never used smokeless tobacco. He reports that he drinks alcohol. He reports that he does not use illicit drugs.  Review of Systems: negative except as above   Blood pressure 149/57, pulse 80, temperature 97.6 F (36.4 C), temperature source Oral, resp. rate 16, height 6' 3"  (1.905 m), weight 115.8 kg (255 lb 4.7 oz), SpO2 96.00%. Head: Normocephalic, without obvious abnormality, atraumatic Neck: no adenopathy, no carotid bruit, no JVD, supple, symmetrical, trachea midline and thyroid not enlarged, symmetric, no tenderness/mass/nodules Resp: clear to auscultation bilaterally Cardio: regular rate and rhythm, S1, S2 normal, no murmur, click, rub or gallop GI: Abdomen soft nondistended with normoactive bowel sounds. No hepatomegaly masses or guarding. Extremities: extremities normal, atraumatic, no cyanosis or edema  Results for orders placed during the hospital encounter of 11/29/13 (from the past 48 hour(s))  BASIC METABOLIC PANEL     Status: Abnormal   Collection Time    11/29/13  1:21 AM      Result Value Ref Range   Sodium 137  137 - 147 mEq/L   Potassium 3.8  3.7 - 5.3 mEq/L   Chloride 99  96 - 112 mEq/L   CO2 24  19 - 32 mEq/L   Glucose,  Bld 113 (*) 70 - 99 mg/dL   BUN 53 (*) 6 - 23 mg/dL   Creatinine, Ser 1.72 (*) 0.50 - 1.35 mg/dL   Calcium 8.8  8.4 - 10.5 mg/dL   GFR calc non Af [name] 35 (*) >90 mL/min   GFR calc Af [name] 41 (*) >90 mL/min   Comment: (NOTE)     The eGFR has been calculated using the CKD EPI equation.     This calculation has not been validated in all clinical situations.     eGFR's persistently <90 mL/min signify possible Chronic Kidney     Disease.  ABO/RH     Status: None   Collection Time    11/29/13  1:21 AM      Result Value Ref Range   ABO/RH(D) O POS    TYPE AND SCREEN     Status: None   Collection Time    11/29/13  1:25 AM      Result Value Ref Range   ABO/RH(D) O POS     Antibody Screen NEG     Sample Expiration 12/02/2013     Unit Number [id number]     Blood Component Type RED CELLS,LR     Unit division 00  Status of Unit ALLOCATED     Transfusion Status OK TO TRANSFUSE     Crossmatch Result Compatible     Unit Number [id number]     Blood Component Type RED CELLS,LR     Unit division 00     Status of Unit ISSUED     Transfusion Status OK TO TRANSFUSE     Crossmatch Result Compatible    CBC WITH DIFFERENTIAL     Status: Abnormal   Collection Time    11/29/13  1:29 AM      Result Value Ref Range   WBC 7.3  4.0 - 10.5 K/uL   RBC 2.20 (*) 4.22 - 5.81 MIL/uL   Hemoglobin 6.6 (*) 13.0 - 17.0 g/dL   Comment: REPEATED TO VERIFY     CRITICAL RESULT CALLED TO, READ BACK BY AND VERIFIED WITH:     ASHELL RN AT 0222 ON [id number] BY DLONG   HCT 20.4 (*) 39.0 - 52.0 %   MCV 92.7  78.0 - 100.0 fL   MCH 30.0  26.0 - 34.0 pg   MCHC 32.4  30.0 - 36.0 g/dL   RDW 15.0  11.5 - 15.5 %   Platelets 322  150 - 400 K/uL   Neutrophils Relative % 42 (*) 43 - 77 %   Neutro Abs 3.1  1.7 - 7.7 K/uL   Lymphocytes Relative 26  12 - 46 %   Lymphs Abs 1.9  0.7 - 4.0 K/uL   Monocytes Relative 17 (*) 3 - 12 %   Monocytes Absolute 1.2 (*) 0.1 - 1.0 K/uL   Eosinophils Relative 14 (*) 0 - 5 %    Eosinophils Absolute 1.0 (*) 0.0 - 0.7 K/uL   Basophils Relative 0  0 - 1 %   Basophils Absolute 0.0  0.0 - 0.1 K/uL  POC OCCULT BLOOD, ED     Status: Abnormal   Collection Time    11/29/13  1:34 AM      Result Value Ref Range   Fecal Occult Bld POSITIVE (*) NEGATIVE  I-STAT CG4 LACTIC ACID, ED     Status: None   Collection Time    11/29/13  1:39 AM      Result Value Ref Range   Lactic Acid, Venous 0.56  0.5 - 2.2 mmol/L  PREPARE RBC (CROSSMATCH)     Status: None   Collection Time    11/29/13  2:00 AM      Result Value Ref Range   Order Confirmation ORDER PROCESSED BY BLOOD BANK    APTT     Status: Abnormal   Collection Time    11/29/13  3:56 AM      Result Value Ref Range   aPTT 38 (*) 24 - 37 seconds   Comment:            IF BASELINE aPTT IS ELEVATED,     SUGGEST PATIENT RISK ASSESSMENT     BE USED TO DETERMINE APPROPRIATE     ANTICOAGULANT THERAPY.  PROTIME-INR     Status: None   Collection Time    11/29/13  3:56 AM      Result Value Ref Range   Prothrombin Time 13.9  11.6 - 15.2 seconds   INR 1.09  0.00 - 1.49  TSH     Status: Abnormal   Collection Time    11/29/13  3:56 AM      Result Value Ref Range   TSH 8.200 (*) 0.350 - 4.500 uIU/mL  Comment: Performed at [facility]  CBC WITH DIFFERENTIAL     Status: Abnormal   Collection Time    11/29/13  3:56 AM      Result Value Ref Range   WBC 7.2  4.0 - 10.5 K/uL   RBC 2.47 (*) 4.22 - 5.81 MIL/uL   Hemoglobin 7.3 (*) 13.0 - 17.0 g/dL   HCT 22.9 (*) 39.0 - 52.0 %   MCV 92.7  78.0 - 100.0 fL   MCH 29.6  26.0 - 34.0 pg   MCHC 31.9  30.0 - 36.0 g/dL   RDW 14.8  11.5 - 15.5 %   Platelets 312  150 - 400 K/uL   Neutrophils Relative % 42 (*) 43 - 77 %   Neutro Abs 3.0  1.7 - 7.7 K/uL   Lymphocytes Relative 26  12 - 46 %   Lymphs Abs 1.9  0.7 - 4.0 K/uL   Monocytes Relative 16 (*) 3 - 12 %   Monocytes Absolute 1.1 (*) 0.1 - 1.0 K/uL   Eosinophils Relative 16 (*) 0 - 5 %   Eosinophils Absolute 1.1 (*) 0.0 -  0.7 K/uL   Basophils Relative 0  0 - 1 %   Basophils Absolute 0.0  0.0 - 0.1 K/uL  COMPREHENSIVE METABOLIC PANEL     Status: Abnormal   Collection Time    11/29/13  3:56 AM      Result Value Ref Range   Sodium 142  137 - 147 mEq/L   Potassium 4.0  3.7 - 5.3 mEq/L   Chloride 104  96 - 112 mEq/L   CO2 24  19 - 32 mEq/L   Glucose, Bld 128 (*) 70 - 99 mg/dL   BUN 49 (*) 6 - 23 mg/dL   Creatinine, Ser 1.74 (*) 0.50 - 1.35 mg/dL   Calcium 9.1  8.4 - 10.5 mg/dL   Total Protein 5.8 (*) 6.0 - 8.3 g/dL   Albumin 2.5 (*) 3.5 - 5.2 g/dL   AST 19  0 - 37 U/L   ALT 9  0 - 53 U/L   Alkaline Phosphatase 108  39 - 117 U/L   Total Bilirubin 0.3  0.3 - 1.2 mg/dL   GFR calc non Af [name] 35 (*) >90 mL/min   GFR calc Af [name] 40 (*) >90 mL/min   Comment: (NOTE)     The eGFR has been calculated using the CKD EPI equation.     This calculation has not been validated in all clinical situations.     eGFR's persistently <90 mL/min signify possible Chronic Kidney     Disease.  RETICULOCYTES     Status: Abnormal   Collection Time    11/29/13  3:56 AM      Result Value Ref Range   Retic Ct Pct 3.3 (*) 0.4 - 3.1 %   RBC. 2.47 (*) 4.22 - 5.81 MIL/uL   Retic Count, Manual 81.5  19.0 - 186.0 K/uL  GLUCOSE, CAPILLARY     Status: Abnormal   Collection Time    11/29/13  7:39 AM      Result Value Ref Range   Glucose-Capillary 122 (*) 70 - 99 mg/dL   Comment 1 Documented in Chart     Comment 2 Notify RN     No results found.  Assessment: Reported GI bleeding with anemia unclear from description of bowel movements whether upper or lower, nondestabilizing at present. Plan:  Treat empirically with proton pump inhibitor. Transfuse as needed  and monitor stools and hemoglobin. We'll probably pursue at least EGD and possibly colonoscopy if he will allow during admission. He appears stable at present. We'll tentatively plan combined procedure on Monday. HAYES,JOHN C 11/29/2013, 1:28 PM

## 2013-11-30 LAB — CBC
HCT: 25.4 % — ABNORMAL LOW (ref 39.0–52.0)
Hemoglobin: 8.1 g/dL — ABNORMAL LOW (ref 13.0–17.0)
MCH: 29.7 pg (ref 26.0–34.0)
MCHC: 31.9 g/dL (ref 30.0–36.0)
MCV: 93 fL (ref 78.0–100.0)
PLATELETS: 342 10*3/uL (ref 150–400)
RBC: 2.73 MIL/uL — AB (ref 4.22–5.81)
RDW: 14.9 % (ref 11.5–15.5)
WBC: 7 10*3/uL (ref 4.0–10.5)

## 2013-11-30 LAB — BASIC METABOLIC PANEL
BUN: 30 mg/dL — ABNORMAL HIGH (ref 6–23)
CHLORIDE: 102 meq/L (ref 96–112)
CO2: 26 meq/L (ref 19–32)
Calcium: 9.2 mg/dL (ref 8.4–10.5)
Creatinine, Ser: 1.27 mg/dL (ref 0.50–1.35)
GFR calc Af Amer: 59 mL/min — ABNORMAL LOW (ref >90)
GFR calc non Af Amer: 50 mL/min — ABNORMAL LOW (ref >90)
Glucose, Bld: 110 mg/dL — ABNORMAL HIGH (ref 70–99)
POTASSIUM: 3.8 meq/L (ref 3.7–5.3)
Sodium: 141 mEq/L (ref 137–147)

## 2013-11-30 LAB — GLUCOSE, CAPILLARY
GLUCOSE-CAPILLARY: 100 mg/dL — AB (ref 70–99)
GLUCOSE-CAPILLARY: 118 mg/dL — AB (ref 70–99)
GLUCOSE-CAPILLARY: 81 mg/dL (ref 70–99)
Glucose-Capillary: 164 mg/dL — ABNORMAL HIGH (ref 70–99)

## 2013-11-30 MED ORDER — BISACODYL 5 MG PO TBEC
10.0000 mg | DELAYED_RELEASE_TABLET | Freq: Once | ORAL | Status: AC
  Administered 2013-11-30: 10 mg via ORAL
  Filled 2013-11-30: qty 2

## 2013-11-30 MED ORDER — ALBUTEROL SULFATE (2.5 MG/3ML) 0.083% IN NEBU: 3.0000 mL | INHALATION_SOLUTION | Freq: Four times a day (QID) | RESPIRATORY_TRACT | Status: DC | PRN

## 2013-11-30 MED ORDER — SALINE SPRAY 0.65 % NA SOLN
1.0000 | NASAL | Status: DC | PRN
Start: 2013-11-30 — End: 2013-12-01
  Filled 2013-11-30: qty 44

## 2013-11-30 MED ORDER — SODIUM CHLORIDE 0.9 % IV SOLN
INTRAVENOUS | Status: DC
  Administered 2013-11-30: 14:00:00 via INTRAVENOUS

## 2013-11-30 MED ORDER — SODIUM CHLORIDE 0.9 % IV SOLN: INTRAVENOUS | Status: DC

## 2013-11-30 MED ORDER — PEG 3350-KCL-NA BICARB-NACL 420 G PO SOLR
4000.0000 mL | Freq: Once | ORAL | Status: AC
  Administered 2013-11-30: 4000 mL via ORAL

## 2013-11-30 NOTE — Progress Notes (Signed)
PROGRESS NOTE  Zachary Vargas KGM:[id number] DOB: 01/17/1928 DOA: 11/29/2013 PCP: Wyatt Haste, MD  HPI: Zachary Vargas is a 80 y.o. male with Past medical history of peptic ulcer disease, peripheral vascular disease, hypertension, diabetes, recent total knee arthroplasty, hypothyroidism. Patient presented with complaints of blood in the bowels. Patient was sent here from the nursing home with the complaint. After the episode of blood in the bowels they check his hemoglobin and it was 6.5and therefore he was sent here. Patient denies any bleeding seen anywhere. Patient appears to be poor historian. Denies any complaint of chest pain, shortness of breath, cough, fever, chills, abdominal pain, acid reflux, nausea, vomiting, fall.  He has some generalized weakness over last few days.  He was on doxycycline until lately. He continues to be on aspirin 81 mg. Not on anticoagulation at present.  The patient is coming from nursing home. And at his baseline independent for most of his ADL.  Assessment/Plan: GI bleed - clinically bleeding has stopped - Hb stable, stable to transfer to med surg floor - GI consulted, possible EGD/CSY tomorrow.   Anemia - due to #1, s/p 1U pRBC, no further needs for transfusion - continue to monitor CBC  DM - SSI  HTN - continue current regimen. Hold ACEI and probably hold on d/c as well given renal function  Hypothyroidism - TSH 8.2, will need dose adjustment on discharge and outpatient TSH repeat in 4 weeks.   Diet: clears Fluids: none DVT Prophylaxis: SCD  Code Status: Full Family Communication: d/w patient  Disposition Plan: inpatient  Consultants:  Gastroenterology  Procedures:  none   Antibiotics - none   HPI/Subjective: Feeling well, no complaints, no abdominal pain, nausea or vomiting.   Objective: Filed Vitals:   11/30/13 0200 11/30/13 0400 11/30/13 0600 11/30/13 0800  BP: 132/57 148/57 149/61 154/61  Pulse: 80 82 81 78    Temp:  98 F (36.7 C)    TempSrc:  Oral    Resp: 15 15 24 13   Height:      Weight:  115.2 kg (253 lb 15.5 oz)    SpO2: 95% 94% 94% 95%    Intake/Output Summary (Last 24 hours) at 11/30/13 0833 Last data filed at 11/30/13 0800  Gross per 24 hour  Intake    363 ml  Output   2400 ml  Net  -2037 ml   Filed Weights   11/29/13 0039 11/29/13 0500 11/30/13 0400  Weight: 117.935 kg (260 lb) 115.8 kg (255 lb 4.7 oz) 115.2 kg (253 lb 15.5 oz)    Exam:   General:  NAD  Cardiovascular: regular rate and rhythm, without MRG  Respiratory: good air movement, clear to auscultation throughout, no wheezing, ronchi or rales  Abdomen: soft, not tender to palpation, positive bowel sounds  MSK: no peripheral edema  Neuro: CN 2-12 grossly intact, MS 5/5 in all 4  Data Reviewed: Basic Metabolic Panel:  Recent Labs Lab 11/29/13 0121 11/29/13 0356 11/30/13 0338  NA 137 142 141  K 3.8 4.0 3.8  CL 99 104 102  CO2 24 24 26   GLUCOSE 113* 128* 110*  BUN 53* 49* 30*  CREATININE 1.72* 1.74* 1.27  CALCIUM 8.8 9.1 9.2   Liver Function Tests:  Recent Labs Lab 11/29/13 0356  AST 19  ALT 9  ALKPHOS 108  BILITOT 0.3  PROT 5.8*  ALBUMIN 2.5*   CBC:  Recent Labs Lab 11/29/13 0129 11/29/13 0356 11/29/13 1355 11/30/13 0338  WBC 7.3 7.2  6.2 7.0  NEUTROABS 3.1 3.0  --   --   HGB 6.6* 7.3* 8.3* 8.1*  HCT 20.4* 22.9* 25.4* 25.4*  MCV 92.7 92.7 92.4 93.0  PLT 322 312 327 342   CBG:  Recent Labs Lab 11/29/13 0739 11/29/13 1241 11/29/13 1630 11/29/13 2321  GLUCAP 122* 110* 169* 100*   Studies: No results found.  Scheduled Meds: . dorzolamide-timolol  1 drop Both Eyes BID  . febuxostat  40 mg Oral Daily  . ferrous gluconate  325 mg Oral QPC breakfast  . furosemide  20 mg Oral Daily  . hydrALAZINE  50 mg Oral TID  . insulin aspart  0-5 Units Subcutaneous QHS  . insulin aspart  0-9 Units Subcutaneous TID WC  . latanoprost  1 drop Right Eye QHS  . levothyroxine  75 mcg  Oral QAC breakfast  . loratadine  10 mg Oral Daily  . nortriptyline  50 mg Oral QHS  . pantoprazole (PROTONIX) IV  40 mg Intravenous Q12H  . PARoxetine  40 mg Oral Daily  . saccharomyces boulardii  250 mg Oral Daily  . sodium chloride  3 mL Intravenous Q12H   Continuous Infusions:   Principal Problem:   GI bleed Active Problems:   Type II or unspecified type diabetes mellitus without mention of complication, not stated as uncontrolled   Depression   Hypothyroidism   Chronic kidney disease, unspecified   Anemia, unspecified   Peripheral neuropathy   Total knee replacement status   Time spent: 25  This note has been created with Surveyor, quantity. Any transcriptional errors are unintentional.   Marzetta Board, MD [facility] Pager [phone]. If 7 PM - 7 AM, please contact night-coverage at [email or web address], password [facility] 11/30/2013, 8:33 AM  LOS: 1 day

## 2013-11-30 NOTE — Progress Notes (Signed)
Concur with previous assessment.

## 2013-11-30 NOTE — Progress Notes (Signed)
[facility] Progress Note  Subjective: No complaints. No evident bowel movements since yesterday  Objective: Vital signs in last 24 hours: Temp:  [97.8 F (36.6 C)-98.4 F (36.9 C)] 98.3 F (36.8 C) (06/21 0930) Pulse Rate:  [78-87] 87 (06/21 0930) Resp:  [13-24] 18 (06/21 0930) BP: (132-159)/(48-82) 143/61 mmHg (06/21 0930) SpO2:  [92 %-97 %] 97 % (06/21 0930) Weight:  [564.125 kg (251 lb 9.6 oz)-115.2 kg (253 lb 15.5 oz)] 114.125 kg (251 lb 9.6 oz) (06/21 0930) Weight change: -2.735 kg (-6 lb 0.5 oz)   PE: Abdomen soft nondistended  Lab Results: Results for orders placed during the hospital encounter of 11/29/13 (from the past 24 hour(s))  CBC     Status: Abnormal   Collection Time    11/29/13  1:55 PM      Result Value Ref Range   WBC 6.2  4.0 - 10.5 K/uL   RBC 2.75 (*) 4.22 - 5.81 MIL/uL   Hemoglobin 8.3 (*) 13.0 - 17.0 g/dL   HCT 25.4 (*) 39.0 - 52.0 %   MCV 92.4  78.0 - 100.0 fL   MCH 30.2  26.0 - 34.0 pg   MCHC 32.7  30.0 - 36.0 g/dL   RDW 14.7  11.5 - 15.5 %   Platelets 327  150 - 400 K/uL  GLUCOSE, CAPILLARY     Status: Abnormal   Collection Time    11/29/13  4:30 PM      Result Value Ref Range   Glucose-Capillary 169 (*) 70 - 99 mg/dL   Comment 1 Documented in Chart     Comment 2 Notify RN    GLUCOSE, CAPILLARY     Status: Abnormal   Collection Time    11/29/13 11:21 PM      Result Value Ref Range   Glucose-Capillary 100 (*) 70 - 99 mg/dL  CBC     Status: Abnormal   Collection Time    11/30/13  3:38 AM      Result Value Ref Range   WBC 7.0  4.0 - 10.5 K/uL   RBC 2.73 (*) 4.22 - 5.81 MIL/uL   Hemoglobin 8.1 (*) 13.0 - 17.0 g/dL   HCT 25.4 (*) 39.0 - 52.0 %   MCV 93.0  78.0 - 100.0 fL   MCH 29.7  26.0 - 34.0 pg   MCHC 31.9  30.0 - 36.0 g/dL   RDW 14.9  11.5 - 15.5 %   Platelets 342  150 - 400 K/uL  BASIC METABOLIC PANEL     Status: Abnormal   Collection Time    11/30/13  3:38 AM      Result Value Ref Range   Sodium 141  137 - 147  mEq/L   Potassium 3.8  3.7 - 5.3 mEq/L   Chloride 102  96 - 112 mEq/L   CO2 26  19 - 32 mEq/L   Glucose, Bld 110 (*) 70 - 99 mg/dL   BUN 30 (*) 6 - 23 mg/dL   Creatinine, Ser 1.27  0.50 - 1.35 mg/dL   Calcium 9.2  8.4 - 10.5 mg/dL   GFR calc non Af [name] 50 (*) >90 mL/min   GFR calc Af [name] 59 (*) >90 mL/min  GLUCOSE, CAPILLARY     Status: Abnormal   Collection Time    11/30/13  7:50 AM      Result Value Ref Range   Glucose-Capillary 118 (*) 70 - 99 mg/dL   Comment 1 Documented in Chart  Comment 2 Notify RN    GLUCOSE, CAPILLARY     Status: Abnormal   Collection Time    11/30/13 11:25 AM      Result Value Ref Range   Glucose-Capillary 164 (*) 70 - 99 mg/dL   Comment 1 Notify RN      Studies/Results: No results found.    Assessment: GI bleeding, unclear whether upper or lower source, last colonoscopy 2006.  Plan: We'll plan EGD and colonoscopy tomorrow after prep. Patient is agreeable. Risks rationale alternatives explained.    HAYES,JOHN C 11/30/2013, 1:03 PM

## 2013-11-30 NOTE — Progress Notes (Signed)
PT Cancellation Note  Patient Details Name: Zachary Vargas MRN: [id number] DOB: 1927-09-03   Cancelled Treatment:    Reason Eval/Treat Not Completed: Patient declined, no reason specified Pt declines therapy today about to have bowel prep.  Plans for EGD and colonoscopy tomorrow 6/22 so will likely be unable to see tomorrow as well.  Will check back Tuesday if pt still in hospital as he reports plan is for d/c back to SNF after procedures.  Geovanny Sartin,KATHrine E 11/30/2013, 2:26 PM Carmelia Bake, PT, DPT 11/30/2013 Pager: [phone]

## 2013-12-01 ENCOUNTER — Encounter (HOSPITAL_COMMUNITY): Payer: Self-pay | Admitting: *Deleted

## 2013-12-01 ENCOUNTER — Encounter (HOSPITAL_COMMUNITY)
Admission: EM | Disposition: A | Discharge status: Skilled Nursing Facility | Payer: Self-pay | Source: Home / Self Care | Attending: Internal Medicine

## 2013-12-01 HISTORY — PX: ESOPHAGOGASTRODUODENOSCOPY: SHX1529

## 2013-12-01 HISTORY — PX: COLONOSCOPY: SHX5424

## 2013-12-01 HISTORY — PX: ESOPHAGOGASTRODUODENOSCOPY: SHX5428

## 2013-12-01 LAB — BASIC METABOLIC PANEL
BUN: 23 mg/dL (ref 6–23)
CO2: 26 meq/L (ref 19–32)
Calcium: 9.1 mg/dL (ref 8.4–10.5)
Chloride: 103 mEq/L (ref 96–112)
Creatinine, Ser: 1.23 mg/dL (ref 0.50–1.35)
GFR calc Af Amer: 61 mL/min — ABNORMAL LOW (ref >90)
GFR, EST NON AFRICAN AMERICAN: 52 mL/min — AB (ref >90)
Glucose, Bld: 107 mg/dL — ABNORMAL HIGH (ref 70–99)
Potassium: 4.5 mEq/L (ref 3.7–5.3)
Sodium: 142 mEq/L (ref 137–147)

## 2013-12-01 LAB — GLUCOSE, CAPILLARY
GLUCOSE-CAPILLARY: 98 mg/dL (ref 70–99)
Glucose-Capillary: 104 mg/dL — ABNORMAL HIGH (ref 70–99)
Glucose-Capillary: 98 mg/dL (ref 70–99)
Glucose-Capillary: 89 mg/dL (ref 70–99)

## 2013-12-01 LAB — CBC
HCT: 27.1 % — ABNORMAL LOW (ref 39.0–52.0)
Hemoglobin: 8.6 g/dL — ABNORMAL LOW (ref 13.0–17.0)
MCH: 30 pg (ref 26.0–34.0)
MCHC: 31.7 g/dL (ref 30.0–36.0)
MCV: 94.4 fL (ref 78.0–100.0)
Platelets: 347 10*3/uL (ref 150–400)
RBC: 2.87 MIL/uL — AB (ref 4.22–5.81)
RDW: 14.8 % (ref 11.5–15.5)
WBC: 8.5 10*3/uL (ref 4.0–10.5)

## 2013-12-01 SURGERY — COLONOSCOPY
Anesthesia: Moderate Sedation

## 2013-12-01 MED ORDER — FENTANYL CITRATE 0.05 MG/ML IJ SOLN
INTRAMUSCULAR | Status: AC
  Filled 2013-12-01: qty 2

## 2013-12-01 MED ORDER — DIPHENHYDRAMINE HCL 50 MG/ML IJ SOLN
INTRAMUSCULAR | Status: AC
  Filled 2013-12-01: qty 1

## 2013-12-01 MED ORDER — PANTOPRAZOLE SODIUM 20 MG PO TBEC: 20.0000 mg | DELAYED_RELEASE_TABLET | Freq: Two times a day (BID) | ORAL | Status: AC

## 2013-12-01 MED ORDER — FENTANYL CITRATE 0.05 MG/ML IJ SOLN
INTRAMUSCULAR | Status: DC | PRN
  Administered 2013-12-01 (×6): 12.5 ug via INTRAVENOUS
  Administered 2013-12-01: 25 ug via INTRAVENOUS

## 2013-12-01 MED ORDER — BUTAMBEN-TETRACAINE-BENZOCAINE 2-2-14 % EX AERO
INHALATION_SPRAY | CUTANEOUS | Status: DC | PRN
  Administered 2013-12-01: 2 via TOPICAL

## 2013-12-01 MED ORDER — HYDROCODONE-ACETAMINOPHEN 5-325 MG PO TABS: 1.0000 | ORAL_TABLET | Freq: Four times a day (QID) | ORAL | Status: DC | PRN

## 2013-12-01 MED ORDER — MIDAZOLAM HCL 10 MG/2ML IJ SOLN
INTRAMUSCULAR | Status: DC | PRN
  Administered 2013-12-01 (×5): 1 mg via INTRAVENOUS
  Administered 2013-12-01: 2 mg via INTRAVENOUS
  Administered 2013-12-01: 1 mg via INTRAVENOUS

## 2013-12-01 MED ORDER — MIDAZOLAM HCL 10 MG/2ML IJ SOLN
INTRAMUSCULAR | Status: AC
  Filled 2013-12-01: qty 2

## 2013-12-01 NOTE — Op Note (Signed)
[facility] [place], [id number]   COLONOSCOPY PROCEDURE REPORT  PATIENT: Zachary Vargas, Zachary Vargas  MR#: [id number] BIRTHDATE: [birth year] , 30  yrs. old GENDER: Male ENDOSCOPIST: Clarene Essex, MD REFERRED BY: PROCEDURE DATE:  12/01/2013 PROCEDURE:   Colonoscopy with biopsy ASA CLASS:   Class III INDICATIONS:heme-positive stool. MEDICATIONS: Fentanyl 87.5 mcg IV and Versed 7 mg IV  DESCRIPTION OF PROCEDURE:   After the risks benefits and alternatives of the procedure were thoroughly explained, informed consent was obtained.  The Pentax Adult Colonscope [id number] endoscope was introduced through the anus and advanced to the terminal ileum which was intubated for a short distance , limited by No adverse events experienced.   The quality of the prep was fair. over a liter of fluid was washed and suctionedbut not all could be removed and there was still vegetable matter and debristhroughout the colon.  The instrument was then slowly withdrawn as the colon was fully examined.the findings are recorded below and the patient tolerated the procedure well there is no signs of active bleeding       FINDINGS:1 internal/external small hemorrhoids 2. Probable stercoral ulcer in the rectum status post biopsy 3. Few left diverticula 4 tiny proximal transverse polyp not biopsied 5. Questionable cecal AVM 6. Otherwise within normal limits to the terminal ileum with only a fair prep small lesions could have been missed  COMPLICATIONS: none  IMPRESSION:  above  RECOMMENDATIONS: await pathology proceed with an endoscopy with further workup and plans please see that dictation   _______________________________ eSigned:  Clarene Essex, MD 12/01/2013 1:03 PM   CC:  PATIENT NAME:  Zachary Vargas, Zachary Vargas MR#: [id number]

## 2013-12-01 NOTE — Op Note (Signed)
[place], [address]   ENDOSCOPY PROCEDURE REPORT  PATIENT: [name], [name]  MR#: [id number] BIRTHDATE: February 23, 1928 , 85  yrs. old GENDER: Male  ENDOSCOPIST: Clarene Essex, MD REFERRED BY:  PROCEDURE DATE:  12/01/2013 PROCEDURE:   EGD, diagnostic ASA CLASS:   Class III INDICATIONS:Anemia secondary to chronic blood loss.  nondiagnostic colonoscopy MEDICATIONS: Fentanyl 12.5 mcg IV and Versed 1mg  IV  TOPICAL ANESTHETIC:used  DESCRIPTION OF PROCEDURE:   After the risks benefits and alternatives of the procedure were thoroughly explained, informed consent was obtained.  The Pentax Gastroscope [id number]  endoscope was introduced through the mouth and advanced to the second portion of the duodenum , limited by Without limitations.   The instrument was slowly withdrawn as the mucosa was fully examined.the findings are recorded below and the patient tolerated the procedure well         FINDINGS:minimal gastritis otherwise normal EGD  COMPLICATIONS:none  ENDOSCOPIC IMPRESSION: above   RECOMMENDATIONS:care with aspirin and nonsteroidals and blood thinners consider A CT next just to rule out anything significant and treat constipation with daily MiraLax if needed and will advance diet and we are happy to see back when necessary   REPEAT EXAM: as needed   _______________________________ Clarene Essex, MD eSigned:  Clarene Essex, MD 12/01/2013 1:14 PM    CC:  PATIENT NAME:  Jonmarc, Bodkin MR#: [id number]

## 2013-12-01 NOTE — Progress Notes (Signed)
Notified Central Telemetry that telemetry will be discontinuing now per doctor order

## 2013-12-01 NOTE — Progress Notes (Signed)
Clinical Social Work Department BRIEF PSYCHOSOCIAL ASSESSMENT 12/01/2013  Patient:  Zachary Vargas,Zachary Vargas     Account Number:  [id number]     Admit date:  11/29/2013  Clinical Social Worker:  GERBER,HOLLY, LCSW  Date/Time:  12/01/2013 10:15 AM  Referred by:  Physician  Date Referred:  12/01/2013 Referred for  Psychosocial assessment   Other Referral:   Interview type:  Patient Other interview type:    PSYCHOSOCIAL DATA Living Status:  FACILITY Admitted from facility:  [facility] Level of care:  Skilled Nursing Facility Primary support name:  Cameron Primary support relationship to patient:  CHILD, ADULT Degree of support available:   Adequate    CURRENT CONCERNS Current Concerns  Post-Acute Placement   Other Concerns:    SOCIAL WORK ASSESSMENT / PLAN CSW received referral in order to complete psychosocial assessment. CSW reviewed chart and met with patient at bedside. CSW introduced myself and explained role.    Patient reports he went to [facility] for rehab after having a knee replacement. Patient states that he has been receiving good care and progressing well at SNF and wants to return at DC. Patient spoke with SNF and did not do a bed hold so was concerned that he could not return. CSW spoke with SNF who reports that since patient did not complete a bed hold then he would not have his private room when returning but they could accept to a semi-private room. CSW explained this information to patient who reports he is agreeable to semi-private room because he feels he is at the right SNF for rehab.    CSW completed FL2 and sent information to SNF. CSW will assist with transfer back to SNF once patient is medically stable.   Assessment/plan status:  Psychosocial Support/Ongoing Assessment of Needs Other assessment/ plan:   Information/referral to community resources:   Will return to SNF    PATIENT'S/FAMILY'S RESPONSE TO PLAN OF CARE: Patient alert  and oriented. Patient engaged during assessment but anxious re: DC plans. Patient reports that he is happy to learn that he can return to SNF and is not worried if he is in a private or semi-private room. Patient thanked CSW for assistance and reports he is ready to get procedures completed so that he can DC back to SNF to complete rehab.       Holly Gerber, LCSW [phone] 

## 2013-12-01 NOTE — Progress Notes (Addendum)
Patient assessment has not changed since this am. Patient is stable at discharge. Report called to [place] at [facility] [employer].

## 2013-12-01 NOTE — Progress Notes (Signed)
Clinical Social Work  CSW faxed DC summary to [facility] who is agreeable to accept patient today. CSW prepared DC packet with FL2 and hard scripts included. CSW informed patient of DC plans and patient reports he is very happy to be DC back to SNF today. CSW coordinated transportation via [facility] per patient request. [facility] Request #: [id number]. RN to call report to SNF.  CSW is signing off but available if needed.  [place], [place] [phone]

## 2013-12-01 NOTE — Discharge Summary (Addendum)
Physician Discharge Summary  Zachary Vargas QIH:[id number] DOB: [birth year] DOA: 11/29/2013  PCP: Wyatt Haste, MD  Admit date: 11/29/2013 Discharge date: 12/01/2013  Time spent: 35 minutes  Recommendations for Outpatient Follow-up:  1. Follow up with PCP in 1-2 weeks 2. Please recheck a CBC later this week 3. Please hold Aspirin until a next CBC is obtained and resume if stable 4. Please note that his PPI has been increased to twice daily 5. Please ensure that his Synthroid is administered by itself with water first thing in the morning and allow at least 30 minutes until eating breakfast. Please avoid administering Synthroid with PPI at the same time.  6. Recheck a TSH in 3 weeks  Discharge Diagnoses:  Principal Problem:   GI bleed Active Problems:   Type II or unspecified type diabetes mellitus without mention of complication, not stated as uncontrolled   Depression   Hypothyroidism   Chronic kidney disease, unspecified   Anemia, unspecified   Peripheral neuropathy   Total knee replacement status  Discharge Condition: stable  Diet recommendation: carb modified  Filed Weights   11/30/13 0400 11/30/13 0930 12/01/13 0520  Weight: 115.2 kg (253 lb 15.5 oz) 114.125 kg (251 lb 9.6 oz) 115.577 kg (254 lb 12.8 oz)   History of present illness:  Zachary Vargas is a 80 y.o. male with Past medical history of peptic ulcer disease, peripheral vascular disease, hypertension, diabetes, recent total knee arthroplasty, hypothyroidism. Patient presented with complaints of blood in the bowels. Patient was sent here from the nursing home with thecomplaint. After the episode of blood in the bowels they check his hemoglobin and it was 6.5 and therefore he was sent here. Patient denies any bleeding seen anywhere. Patient appears to be poor historian. Denies any complaint of chest pain, shortness of breath, cough, fever, chills, abdominal pain, acid reflux, nausea, vomiting, fall. He has  some generalized weakness over last few days. He was on doxycycline until lately. He continues to be on aspirin 81 mg. Not on anticoagulation at present. The patient is coming from nursing home. And at his baseline independent for most of his ADL.  Hospital Course:  GI bleed - clinically bleeding has stopped shortly after admission, GI has been consulted and underwent colonoscopy and upper endoscopy without a clear source of bleeding, see results below.  Anemia, acute - normocytic, due to #1, s/p 1U pRBC, no further needs for transfusion, continue to monitor CBC as an outpatient. Please restart his Aspirin later this week after rechecking a CBC on Thursday or Friday.  Hypothyroidism - TSH 8.2, please ensure correct administration of Synthroid as above and repeat TSH in 3 weeks.   Procedures:  Colonoscopy 1 internal/external small hemorrhoids 2. Probable stercoral ulcer in the rectum status post biopsy 3. Few left diverticula 4  tiny proximal transverse polyp not biopsied 5. Questionable cecal  AVM 6. Otherwise within normal limits to the terminal ileum with  only a fair prep small lesions could have been missed   EGD  minimal gastritis otherwise normal EGD  Consultations:  Gastroenterology   Discharge Exam: Filed Vitals:   12/01/13 1310 12/01/13 1317 12/01/13 1320 12/01/13 1441  BP: 102/37 113/34 113/34 133/53  Pulse:   83 80  Temp:    98.5 F (36.9 C)  TempSrc:    Oral  Resp: 14 16 19 18   Height:      Weight:      SpO2: 100% 100% 97% 98%    General: NAD  Cardiovascular: RRR Respiratory: CTA biL  Discharge Instructions    Medication List         acetaminophen 500 MG tablet  Commonly known as:  TYLENOL  Take 1 tablet (500 mg total) by mouth every 6 (six) hours as needed for mild pain.     aspirin EC 81 MG tablet  Take 1 tablet (81 mg total) by mouth daily.     cholecalciferol 1000 UNITS tablet  Commonly known as:  VITAMIN D  Take 1,000 Units by mouth daily.      colchicine 0.6 MG tablet  Take 0.6 mg by mouth daily.     docusate sodium 100 MG capsule  Commonly known as:  COLACE  Take 100 mg by mouth 2 (two) times daily.     dorzolamide-timolol 22.3-6.8 MG/ML ophthalmic solution  Commonly known as:  COSOPT  Place 1 drop into both eyes 2 (two) times daily.     febuxostat 40 MG tablet  Commonly known as:  ULORIC  Take 40 mg by mouth daily.     feeding supplement (PRO-STAT SUGAR FREE 64) Liqd  Take 30 mLs by mouth 3 (three) times daily with meals.     ferrous gluconate 325 MG tablet  Commonly known as:  FERGON  Take 325 mg by mouth daily with breakfast.     Fish Oil 1000 MG Caps  Take 1,000 mg by mouth daily.     furosemide 40 MG tablet  Commonly known as:  LASIX  Take 0.5 tablets (20 mg total) by mouth daily.     glipiZIDE 10 MG tablet  Commonly known as:  GLUCOTROL  Take 5 mg by mouth daily before breakfast.     hydrALAZINE 50 MG tablet  Commonly known as:  APRESOLINE  Take 50 mg by mouth 3 (three) times daily.     HYDROcodone-acetaminophen 5-325 MG per tablet  Commonly known as:  NORCO/VICODIN  Take 1 tablet by mouth every 6 (six) hours as needed for moderate pain.     insulin aspart 100 UNIT/ML injection  Commonly known as:  novoLOG  Inject 2-11 Units into the skin 3 (three) times daily before meals. Per sliding scale     latanoprost 0.005 % ophthalmic solution  Commonly known as:  XALATAN  Place 1 drop into the right eye at bedtime.     levothyroxine 75 MCG tablet  Commonly known as:  SYNTHROID, LEVOTHROID  Take 75 mcg by mouth daily before breakfast.     lisinopril 10 MG tablet  Commonly known as:  PRINIVIL,ZESTRIL  Take 30 mg by mouth daily.     loratadine 10 MG tablet  Commonly known as:  CLARITIN  Take 10 mg by mouth daily.     multivitamin with minerals tablet  Take 1 tablet by mouth daily.     nortriptyline 50 MG capsule  Commonly known as:  PAMELOR  Take 1 capsule (50 mg total) by mouth at bedtime.       pantoprazole 20 MG tablet  Commonly known as:  PROTONIX  Take 1 tablet (20 mg total) by mouth 2 (two) times daily before a meal.     PARoxetine 40 MG tablet  Commonly known as:  PAXIL  Take 40 mg by mouth every morning.     saccharomyces boulardii 250 MG capsule  Commonly known as:  FLORASTOR  Take 250 mg by mouth daily.     zolpidem 5 MG tablet  Commonly known as:  AMBIEN  Take 1 tablet (5 mg  total) by mouth at bedtime as needed for sleep.         The results of significant diagnostics from this hospitalization (including imaging, microbiology, ancillary and laboratory) are listed below for reference.    Significant Diagnostic Studies: Ct Head Wo Contrast  11/08/2013   CLINICAL DATA:  Altered mental status  EXAM: CT HEAD WITHOUT CONTRAST  TECHNIQUE: Contiguous axial images were obtained from the base of the skull through the vertex without intravenous contrast.  COMPARISON:  None.  FINDINGS: The bony calvarium is intact. No gross soft tissue abnormality is noted. Atrophic changes and chronic white matter ischemic change is identified. No findings to suggest acute hemorrhage, acute infarction or space-occupying mass lesion are noted.  IMPRESSION: Chronic changes without acute abnormality.   Electronically Signed   By: Inez Catalina M.D.   On: 11/08/2013 07:54   Ct Chest Wo Contrast  11/08/2013   CLINICAL DATA:  Cough and hypotension  EXAM: CT CHEST WITHOUT CONTRAST  TECHNIQUE: Multidetector CT imaging of the chest was performed following the standard protocol without IV contrast.  COMPARISON:  11/08/2013, 11/29/2012, 04/29/2012 and 10/15/2009  FINDINGS: The lungs are again well aerated bilaterally. Diffuse fibrotic changes are again seen with some associated calcifications most consistent with prior granulomatous disease. No focal confluent infiltrate is identified. The overall appearance is similar to that seen on prior CT examinations. No sizable effusion is seen.  The hilar and  mediastinal structures show no significant lymphadenopathy. Heavy coronary calcifications are noted. Aortic calcifications without aneurysmal dilatation are seen. The visualized upper abdomen is within normal limits. The osseous structures show degenerative change of the lumbar spine as well as prior right rib fractures. .  IMPRESSION: Chronic changes in both lungs most consistent with prior granulomatous disease. The overall appearance is stable from previous CTs dating back to 2011.  No acute infiltrate is seen.  Chronic changes without acute abnormality.   Electronically Signed   By: Inez Catalina M.D.   On: 11/08/2013 08:02   Dg Chest Port 1 View  11/08/2013   CLINICAL DATA:  Cough, altered mental status, hypotension  EXAM: PORTABLE CHEST - 1 VIEW  COMPARISON:  Prior radiograph from 11/29/2012  FINDINGS: Cardiomegaly is stable as compared to prior study. Atherosclerotic calcifications present within the aortic arch.  Lungs are mildly hypoinflated with fibrotic lung changes again seen. Superimposed irregular interstitial opacities are seen within the right mid and lower lung, suspicious for possible superimposed infection. Additional increased interstitial opacity seen within the left lung base as well. No pulmonary edema or definite pleural effusion. No pneumothorax.  No acute osseous abnormality. Prominent degenerative changes noted at the right shoulder.  IMPRESSION: 1. Chronic fibrotic lung changes with increased interstitial opacities within the right mid lung and bilateral lung bases. While findings may reflect progressive fibrosis, possible superimposed infection could be considered in the correct clinical setting. 2. Stable cardiomegaly.   Electronically Signed   By: Jeannine Boga M.D.   On: 11/08/2013 05:48   Labs: Basic Metabolic Panel:  Recent Labs Lab 11/29/13 0121 11/29/13 0356 11/30/13 0338 12/01/13 0425  NA 137 142 141 142  K 3.8 4.0 3.8 4.5  CL 99 104 102 103  CO2 24 24 26  26   GLUCOSE 113* 128* 110* 107*  BUN 53* 49* 30* 23  CREATININE 1.72* 1.74* 1.27 1.23  CALCIUM 8.8 9.1 9.2 9.1   Liver Function Tests:  Recent Labs Lab 11/29/13 0356  AST 19  ALT 9  ALKPHOS 108  BILITOT 0.3  PROT 5.8*  ALBUMIN 2.5*   CBC:  Recent Labs Lab 11/29/13 0129 11/29/13 0356 11/29/13 1355 11/30/13 0338 12/01/13 0425  WBC 7.3 7.2 6.2 7.0 8.5  NEUTROABS 3.1 3.0  --   --   --   HGB 6.6* 7.3* 8.3* 8.1* 8.6*  HCT 20.4* 22.9* 25.4* 25.4* 27.1*  MCV 92.7 92.7 92.4 93.0 94.4  PLT 322 312 327 342 347   CBG:  Recent Labs Lab 11/30/13 0750 11/30/13 1125 11/30/13 1618 12/01/13 0733 12/01/13 1124  GLUCAP 118* 164* 81 104* 98    Signed:  GHERGHE, COSTIN  [facility] 12/01/2013, 3:08 PM

## 2013-12-01 NOTE — Progress Notes (Signed)
Zachary Vargas 12:18 PM  Subjective: Patient without any GI complaints and no signs of bleeding and may not be clean enough however  Objective: Vital signs stable afebrile exam please see pre-assessment evaluation labs reviewed  Assessment: Subacute GI bleed  Plan: We rediscussed colonoscopy and endoscopy and will proceed today with further workup and plans pending those findings  [facility]

## 2013-12-02 ENCOUNTER — Encounter (HOSPITAL_COMMUNITY): Payer: Self-pay | Admitting: Gastroenterology

## 2013-12-02 NOTE — Telephone Encounter (Signed)
fyi

## 2013-12-03 LAB — TYPE AND SCREEN
ABO/RH(D): O POS
ANTIBODY SCREEN: NEGATIVE
UNIT DIVISION: 0
Unit division: 0

## 2013-12-05 NOTE — OR Nursing (Signed)
Addendum to scope page 

## 2013-12-16 ENCOUNTER — Telehealth: Payer: Self-pay | Admitting: Family Medicine

## 2013-12-16 NOTE — Telephone Encounter (Signed)
Pt called and stated he would like for you to call him. When questioned he stated it was concerning sleep medication that was not being honored and he was hoping that you could assist him with that. Please call pt at [phone]

## 2013-12-18 ENCOUNTER — Encounter: Payer: Self-pay | Admitting: Internal Medicine

## 2013-12-22 ENCOUNTER — Telehealth: Payer: Self-pay | Admitting: Internal Medicine

## 2013-12-22 NOTE — Telephone Encounter (Signed)
LEFT MESSAGE FOR PT TO CALL ME BACK

## 2013-12-22 NOTE — Telephone Encounter (Signed)
Pt states he is getting a reading under 90 every day before lunchtime and he wanted to know what he needed to do

## 2013-12-22 NOTE — Telephone Encounter (Signed)
Find out if he is still in the rehabilitation facility. If he is,they need to help monitor his blood sugar

## 2013-12-22 NOTE — Telephone Encounter (Signed)
Pt states that he is still in rehab for another 2 days and that they know about his low blood sugar but are not doing anything about it. They tell him its okay. i told him if it is still running low when he gets out to make an appt here.

## 2013-12-23 ENCOUNTER — Telehealth: Payer: Self-pay | Admitting: Family Medicine

## 2013-12-23 ENCOUNTER — Encounter: Payer: Self-pay | Admitting: Family Medicine

## 2013-12-23 NOTE — Telephone Encounter (Signed)
Go and set this up

## 2013-12-24 NOTE — Telephone Encounter (Signed)
FAXED REFERRAL TO [facility]

## 2013-12-25 ENCOUNTER — Telehealth: Payer: Self-pay | Admitting: Internal Medicine

## 2013-12-25 ENCOUNTER — Institutional Professional Consult (permissible substitution): Payer: Medicare Other | Admitting: Family Medicine

## 2013-12-25 NOTE — Telephone Encounter (Signed)
Okay to give a verbal order for A1c

## 2013-12-25 NOTE — Telephone Encounter (Signed)
LEFT ROSE A MESSAGE TO PLEASE DO A SAFETY ASSES. AND TRY TO MAKE THE HOME SAFER BECAUSE PT WILL NOT GO TO A NURSING FACILITY

## 2013-12-25 NOTE — Telephone Encounter (Signed)
[facility]N SENT A A MESSAGE BACK THEY WOULD BE GOING OUT SOON

## 2013-12-25 NOTE — Telephone Encounter (Signed)
Talked with Zachary Vargas and let her know it was okay to do a1c

## 2013-12-25 NOTE — Telephone Encounter (Signed)
See if home health can go by and do a safety assessment and make arrangements for making the place as safe as possible.
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
## 2013-12-26 ENCOUNTER — Telehealth: Payer: Self-pay | Admitting: Family Medicine

## 2013-12-26 NOTE — Telephone Encounter (Signed)
Zachary Vargas called from [facility] and said she was ask to go by the patients house to do safety evaluation. She said she went by his house and he is not interested in anyone coming by his house everyday like a nurse and he is not interested in going to a nursing facility either. She said that she went over his medication and she found that he has taken two different dose's of thyroid medication for the past 3 days. One dose was 112 mcg and the other was 100 mcg. One Rx came from Zachary Vargas the other came from the mail order pharmacy. He states he didn't know that it was the same medication. Zachary Vargas states that on his calender it shows that Zachary Phillips RN from [facility] is suppose to come out to his home today at about 430 pm. Zachary Vargas want to know if we should ask the home health nurse to draw some blood to recheck his levels to determine the correct dose of thyroid medication he should be on. Please, advise. CLS

## 2013-12-26 NOTE — Telephone Encounter (Signed)
Yes, he did take 2 different doses 112 mcg and 100 mcg total 212 mcg for 3 days. The lady never said that there was a bottle there with 75 mcg. CLS

## 2013-12-26 NOTE — Telephone Encounter (Signed)
From the message I assume he doubled up or took 2 different doses on the same day.   If that is the case, have him hold/not take the higher dose bottle for now.  Whatever the lower dose is, presumably 87mcg, have him take 1/2 tablet tomorrow morning, then resume 1 tablet daily in the morning of the lower dose medication.  If he is having palpitations, chest pains, headaches, then let me know.   If not, plan to recheck thyroid labs in 3weeks.

## 2013-12-26 NOTE — Telephone Encounter (Signed)
We show 75.  Ok, then take the 129mcg, 1/2 tablet tomorrow morning, then 1 tablet daily 155mcg.

## 2013-12-29 ENCOUNTER — Telehealth: Payer: Self-pay

## 2013-12-29 NOTE — Telephone Encounter (Signed)
I spoke to Zachary Vargas and gave him the instructions from [facility] in regards to the thyroid medication. CLS

## 2013-12-29 NOTE — Telephone Encounter (Signed)
[facility] HAS CALLED TWO TIMES TODAY I OKAYED PT FOR THE PATIENT EARLIER NOW THEY CALL TO SAY HIS B/P 78/45 AND WANTED TO KNOW IF TO SEND PATIENT TO HOSPITAL I TOLD THEM I COULD NOT GIVE THAT AUTHE. I ASK SHANE OUR PA ON WHAT TO DO HE SAID YES SEND HIM SO I INFORMED THE NURSE IT WAS OKAY TO SEND HIM

## 2013-12-29 NOTE — Telephone Encounter (Signed)
12/29/2013 °

## 2013-12-30 ENCOUNTER — Telehealth: Payer: Self-pay | Admitting: Family Medicine

## 2013-12-30 ENCOUNTER — Telehealth: Payer: Self-pay

## 2013-12-30 ENCOUNTER — Other Ambulatory Visit: Payer: Self-pay

## 2013-12-30 ENCOUNTER — Telehealth: Payer: Self-pay | Admitting: Medical

## 2013-12-30 NOTE — Telephone Encounter (Signed)
pls call and inquire
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
## 2013-12-30 NOTE — Telephone Encounter (Signed)
[place] LEFT MESSAGE THAT Zachary Vargas REFUSED TO GO TO ER AND SHE SAID IF HE CONTINUES TO BE NONE COMPLIANT THAT THEY WILL HAVE TO DROP HIM SHE HAS A SOCIAL WORKER COMING OUT TODAY

## 2013-12-30 NOTE — Telephone Encounter (Signed)
Pt called and asked that you call him back. Pt exact words "ask him to take a few minutes and please call me please"  Pt states he can not come in, he is just not physically able to. He would not expand on what he wanted. Pt stated he had a question to ask you.

## 2013-12-30 NOTE — Telephone Encounter (Signed)
Judith called and states please refill all his meds that need refilling especially the ezcema meds and the sleeps meds and any others that are running out.   He uses [facility].

## 2013-12-30 NOTE — Telephone Encounter (Signed)
I HAD GOTTEN A MESSAGE FROM DIANA MR.[place] CALLED WANTING REFILLS ON HIS [facility] AND AMBIEN [facility] SO I CALLED PT ASKED HOW HE WAS DOING HE SAID FINE THAT THE HOME HEALTH PEOPLE WHERE BLOWING THINGS OUT OF CONTEXT AND I SAID WOULD YOU MIND WAITING FOR DR.LALONDE TO GET BACK Monday SO I COULD SEE IF HE COULD GET THE AMBIEN HE SAID SURE NOT A PROBLEM AND I ALSO ASKED IF HE COULD GO THROUGH HIS MEDS AND LET ME KNOW EXACTLEY WHAT HE NEEDED SO I DIDN'T ORDER SOMETHING HE WANT NEED YET HE SAID HE WOULD AND HE WOULD CALL ME BACK AT THIS TIME I AM STILL WAITING ON HIS CALL BACK

## 2013-12-31 ENCOUNTER — Other Ambulatory Visit: Payer: Self-pay | Admitting: Family Medicine

## 2013-12-31 ENCOUNTER — Other Ambulatory Visit: Payer: Self-pay | Admitting: Medical

## 2013-12-31 NOTE — Telephone Encounter (Signed)
Are these okay to refill? 

## 2013-12-31 NOTE — Telephone Encounter (Signed)
Is this okay to refill? 

## 2014-01-01 ENCOUNTER — Telehealth: Payer: Self-pay | Admitting: Internal Medicine

## 2014-01-01 NOTE — Telephone Encounter (Signed)
Nancy walker RN called to give [place] an update that pt fell off the toliet at 9:00am yesterday morning but was able to get up himself. He also had a BM but did not call her to come get the specimen and take it to the lab to egt checked for c diff. She did leave a specimen container there to get when he has another BM. She is going to call [facility] to see about getting him assistance for a little while or trying to get him in for assisted living possibleand to find out how much it is and to see if he will be willing to do that. Just an [place]

## 2014-01-01 NOTE — Telephone Encounter (Signed)
With his worsening knee situation, I really think he should consider skilled nursing facility so he can have someone there to help, to be able to help him ambulate when needed, to make sure he is taking his medications, make sure he gets good hygiene, food, etc.

## 2014-01-02 ENCOUNTER — Telehealth: Payer: Self-pay | Admitting: Medical

## 2014-01-02 NOTE — Telephone Encounter (Signed)
Just ask him to have 30-45 min f/u with Dr. Redmond School

## 2014-01-02 NOTE — Telephone Encounter (Signed)
I looked over home health assessment to verify meds and the meds listed are certainly not aligned with recent hospital discharge instructions.   Please review the updated list with home health so they can instruct him accordingly.   He needs a hopsital f/u visit with Dr. Redmond School (46min)

## 2014-01-02 NOTE — Telephone Encounter (Signed)
Patient states that there is no way that he will go to a skilled nursing place. He said that is hell and he would rather sit there a twitle his thumbs. CLS

## 2014-01-05 ENCOUNTER — Telehealth: Payer: Self-pay | Admitting: Family Medicine

## 2014-01-05 ENCOUNTER — Other Ambulatory Visit: Payer: Self-pay | Admitting: Family Medicine

## 2014-01-05 MED ORDER — METRONIDAZOLE 500 MG PO TABS: 500.0000 mg | ORAL_TABLET | Freq: Three times a day (TID) | ORAL | Status: DC

## 2014-01-05 NOTE — Telephone Encounter (Signed)
There is not much we can do if he is noncompliant

## 2014-01-05 NOTE — Telephone Encounter (Signed)
Per Dorothea Ogle PAC, I called the patient and informed him that he was positive C-Diff and that I was going to call him out Flagyl 500 mg 3 x a day for 10 days. I sent his medication into [place]. No alcohol while on this medication. Patient understood my message in detail. CLS

## 2014-01-05 NOTE — Telephone Encounter (Signed)
DR.LALONDE WHAT DO YOU WANT ME TO DO PT HAS APPOINTMENT WITH YOU 01/12/14 HE IS NOW DISCHARGED FROM [facility] WAS REQUESTED TO GO OUT

## 2014-01-05 NOTE — Telephone Encounter (Signed)
Zachary Vargas with [facility] states they are discharging patient for non compliance.  [name] phone [phone]

## 2014-01-06 ENCOUNTER — Encounter (HOSPITAL_COMMUNITY): Payer: Self-pay | Admitting: Emergency Medicine

## 2014-01-06 ENCOUNTER — Emergency Department (HOSPITAL_COMMUNITY): Payer: Medicare Other

## 2014-01-06 ENCOUNTER — Emergency Department (HOSPITAL_COMMUNITY)
Admission: EM | Admit: 2014-01-06 | Discharge: 2014-01-06 | Disposition: A | Discharge status: Home / Self Care | Payer: Medicare Other | Attending: Emergency Medicine | Admitting: Emergency Medicine

## 2014-01-06 DIAGNOSIS — Z8701 Personal history of pneumonia (recurrent): Secondary | ICD-10-CM | POA: Diagnosis not present

## 2014-01-06 DIAGNOSIS — E119 Type 2 diabetes mellitus without complications: Secondary | ICD-10-CM | POA: Insufficient documentation

## 2014-01-06 DIAGNOSIS — F329 Major depressive disorder, single episode, unspecified: Secondary | ICD-10-CM | POA: Diagnosis not present

## 2014-01-06 DIAGNOSIS — Y9389 Activity, other specified: Secondary | ICD-10-CM | POA: Diagnosis not present

## 2014-01-06 DIAGNOSIS — F3289 Other specified depressive episodes: Secondary | ICD-10-CM | POA: Insufficient documentation

## 2014-01-06 DIAGNOSIS — D649 Anemia, unspecified: Secondary | ICD-10-CM | POA: Diagnosis not present

## 2014-01-06 DIAGNOSIS — S91311A Laceration without foreign body, right foot, initial encounter: Secondary | ICD-10-CM

## 2014-01-06 DIAGNOSIS — Z79899 Other long term (current) drug therapy: Secondary | ICD-10-CM | POA: Insufficient documentation

## 2014-01-06 DIAGNOSIS — Z8711 Personal history of peptic ulcer disease: Secondary | ICD-10-CM | POA: Diagnosis not present

## 2014-01-06 DIAGNOSIS — Z87442 Personal history of urinary calculi: Secondary | ICD-10-CM | POA: Diagnosis not present

## 2014-01-06 DIAGNOSIS — I129 Hypertensive chronic kidney disease with stage 1 through stage 4 chronic kidney disease, or unspecified chronic kidney disease: Secondary | ICD-10-CM | POA: Diagnosis not present

## 2014-01-06 DIAGNOSIS — N189 Chronic kidney disease, unspecified: Secondary | ICD-10-CM | POA: Diagnosis not present

## 2014-01-06 DIAGNOSIS — K219 Gastro-esophageal reflux disease without esophagitis: Secondary | ICD-10-CM | POA: Insufficient documentation

## 2014-01-06 DIAGNOSIS — M129 Arthropathy, unspecified: Secondary | ICD-10-CM | POA: Insufficient documentation

## 2014-01-06 DIAGNOSIS — Y929 Unspecified place or not applicable: Secondary | ICD-10-CM | POA: Diagnosis not present

## 2014-01-06 DIAGNOSIS — S91309A Unspecified open wound, unspecified foot, initial encounter: Secondary | ICD-10-CM | POA: Diagnosis present

## 2014-01-06 DIAGNOSIS — H409 Unspecified glaucoma: Secondary | ICD-10-CM | POA: Diagnosis not present

## 2014-01-06 DIAGNOSIS — F411 Generalized anxiety disorder: Secondary | ICD-10-CM | POA: Diagnosis not present

## 2014-01-06 DIAGNOSIS — E039 Hypothyroidism, unspecified: Secondary | ICD-10-CM | POA: Insufficient documentation

## 2014-01-06 DIAGNOSIS — G8929 Other chronic pain: Secondary | ICD-10-CM | POA: Diagnosis not present

## 2014-01-06 DIAGNOSIS — W010XXA Fall on same level from slipping, tripping and stumbling without subsequent striking against object, initial encounter: Secondary | ICD-10-CM | POA: Insufficient documentation

## 2014-01-06 DIAGNOSIS — Z87448 Personal history of other diseases of urinary system: Secondary | ICD-10-CM | POA: Insufficient documentation

## 2014-01-06 NOTE — Progress Notes (Signed)
  CARE MANAGEMENT ED NOTE 01/06/2014  Patient:  Zachary Vargas, Zachary Vargas   Account Number:  [id number]  Date Initiated:  01/06/2014  Documentation initiated by:  Livia Snellen  Subjective/Objective Assessment:   Patient presents to Ed post fall with injury to right foot     Subjective/Objective Assessment Detail:     Action/Plan:   Action/Plan Detail:   Anticipated DC Date:  01/06/2014     Status Recommendation to Physician:   Result of Recommendation:    Other ED Services  Consult Working [place]  Other    Choice offered to / List presented to:            Status of service:  Completed, signed off  ED Comments:   ED Comments Detail:  EDCM spoke to patient at bedside. Patient reports he lives alone.  As per patient, his neighbors check in on him and he has a friend that stops in to check in on him every day. Patient confirms that he is wheelchair bound most of the time.  Patient rpeorts that he has a new home health agency and has confirmed that [place] has signed off on him. Patient cannot recall the name of the new agency but reports he has a visiting RN, PT, and an aide.  Patient also reports he is being seen by [facility]. Patient reports he has two wheelchairs at home, one upstairs and one downstairs, urinals, Bedside commode and a shower chair at home.  EDCM offered privtae duty nursing agency list but patient reports he has this list at home. Patient reports he has a private duty RN named [name] who comes to his house once every two to three weeks.  [facility] encouraged patient to ask Rose to increase her services. Patient reports he feels safe at home and will absolutely not go back to a SNF.  [facility] also encouraged patient to utilize [facility] more often.  Patient verbalized understanding and thankful for [facility] concern.  No further EDCM needs at this time.

## 2014-01-06 NOTE — ED Notes (Signed)
[facility] called for transport.  

## 2014-01-06 NOTE — ED Notes (Signed)
Per EMS report: pt from home: pt had knee surgery in April 2015 and has been in a wheelchair since.  Pt able to stand and bear weight but is unable to ambulate.  Pt was attempting to transfer from his wheelchair to a stool when he slipped and fell.  Pt denies LOC or hitting his head.  Pt did cut his big right toe.  Bleeding currently controlled.  Pt does not take blood thinners.  EMS VS: 118/64, HR: 68, RR: 16  Pt a/o x 4.

## 2014-01-06 NOTE — Discharge Instructions (Signed)

## 2014-01-06 NOTE — ED Provider Notes (Signed)
CSN: [id number]     Arrival date & time 01/06/14  1934 History   First MD Initiated Contact with Patient 01/06/14 1935     Chief Complaint  Patient presents with  . Fall     (Consider location/radiation/quality/duration/timing/severity/associated sxs/prior Treatment) HPI 80 year old male presents after falling in the bathroom. He states that he had knee surgery in April 2015 his been having difficulty bearing weight on his right knee due to this. He states his knee buckled when he is walking and he fell. He did not injure his head, neck, or lose consciousness. He sliced his right foot but is not sure on what. He does not think there was a broken glass. A blood thinners. His last tetanus musician was 3 years ago. Past Medical History  Diagnosis Date  . Glaucoma     uses Eye Drops daily  . Dyslipidemia     statin intolerant  . Gout     takes Colchicine and Uloric daily  . BPH (benign prostatic hyperplasia)     [facility]  . History of peptic ulcer disease   . Peptic ulcer disease   . Diverticulosis   . Lumbar spinal stenosis     Most recent MRI 12/ 2011; [facility] - Dr. Nelva Bush  . Carotid atherosclerosis     Dopplers 11/09 showing 50-69% stenosis  . Peripheral arterial disease     [place] health care consult 1/08  . Epigastric pain     Hospitalization 7/11 [facility]  . Arthritis     Followed by [facility]  . Myeloma     IgA related; Dr. Marin Olp - 2009  . Headache(784.0)     rarely  . Constipation     but doesn't take any meds  . Urinary frequency   . Anxiety     takes Paxil daily  . Insomnia     takes Ambien nightly   . Anemia     of chronic disease;takes Ferrous Gluconate daily  . Osteomyelitis   . Hypertension     takes Lisinopril daily  . Peripheral edema     takes Furosemide daily  . Pneumonia     hx of-about 100yrs ago  . Peripheral neuropathy   . Joint pain   . Joint swelling   . Chronic back pain     spinal stenosis  . Chronic kidney disease     not on dialysis  . Hypothyroidism     takes Synthroid daily  . GERD (gastroesophageal reflux disease)     takes Protonix daily  . Depression   . Gout   . Diabetes mellitus     takes glipizide daily, Type 2  . Cataract   . Substance abuse     alcohol   Past Surgical History  Procedure Laterality Date  . Abdominal aortic aneurysm repair  03/1998  . Abdominal hernia repair  2000  . Renal stone surgery  1999  . Nerve root block      Multiple for lumbar spine  . Colonoscopy  07/22/2004    due repeat 2011; Dr. Teena Irani, [facility]  . Eye surgery      bil cataract surgery  . Amputation Left 11/29/2012    Procedure: Left Foot, Great Toe Amputation at MTP Joint;  Surgeon: Newt Minion, MD;  Location: [facility];  Service: Orthopedics;  Laterality: Left;  LEFT  Foot, Great Toe Amputation at MTP Joint  . Amputation Left 01/03/2013    Procedure: Left foot 2nd toe amputation at  metatarsophalangeal;  Surgeon: Newt Minion, MD;  Location: [facility];  Service: Orthopedics;  Laterality: Left;  . Tonsillectomy    . Vasectomy    . Amputation Left 03/19/2013    Procedure: AMPUTATION DIGIT;  Surgeon: Newt Minion, MD;  Location: [facility];  Service: Orthopedics;  Laterality: Left;  Left foot Amputation 3, 4, 5th Toes at MTP joint  . Esophagogastroduodenoscopy    . Total knee arthroplasty Right 10/15/2013    Procedure: TOTAL KNEE ARTHROPLASTY;  Surgeon: Newt Minion, MD;  Location: [facility];  Service: Orthopedics;  Laterality: Right;  Right Total Knee Arthroplasty  . Hernia repair N/A 2000    abdominal  . Colonoscopy N/A 12/01/2013    Procedure: COLONOSCOPY;  Surgeon: Jeryl Columbia, MD;  Location: [facility];  Service: Endoscopy;  Laterality: N/A;  . Esophagogastroduodenoscopy N/A 12/01/2013    Procedure: ESOPHAGOGASTRODUODENOSCOPY (EGD);  Surgeon: Jeryl Columbia, MD;  Location: [facility];  Service: Endoscopy;  Laterality: N/A;  . Esophagogastroduodenoscopy  12/01/2013   Family History  Problem Relation  Age of Onset  . Diabetes Father   . Glaucoma Father   . Cancer Brother     1 died of lung cancer, 1 of colon, 1 of different cancer  . Glaucoma Daughter   . Glaucoma Son    History  Substance Use Topics  . Smoking status: Former Research scientist (life sciences)  . Smokeless tobacco: Never Used     Comment: quit smoking 84yrs ago  . Alcohol Use: Yes     Comment: 4 oz drink daily    Review of Systems  Constitutional: Negative for fever.  Gastrointestinal: Negative for vomiting.  Musculoskeletal: Positive for arthralgias. Negative for neck pain.  Skin: Positive for wound.  Neurological: Negative for weakness, numbness and headaches.  All other systems reviewed and are negative.     Allergies  Morphine and related; Statins; and Allopurinol  Home Medications   Prior to Admission medications   Medication Sig Start Date End Date Taking? Authorizing Provider  acetaminophen (TYLENOL) 500 MG tablet Take 1 tablet (500 mg total) by mouth every 6 (six) hours as needed for mild pain. 10/17/13  Yes Newt Minion, MD  cholecalciferol (VITAMIN D) 1000 UNITS tablet Take 1,000 Units by mouth daily.   Yes Historical Provider, MD  colchicine 0.6 MG tablet Take 0.6 mg by mouth daily.   Yes Historical Provider, MD  docusate sodium (COLACE) 100 MG capsule Take 100 mg by mouth 2 (two) times daily.   Yes Historical Provider, MD  dorzolamide-timolol (COSOPT) 22.3-6.8 MG/ML ophthalmic solution Place 1 drop into both eyes 2 (two) times daily.    Yes Historical Provider, MD  febuxostat (ULORIC) 40 MG tablet Take 40 mg by mouth daily.   Yes Historical Provider, MD  ferrous gluconate (FERGON) 325 MG tablet Take 325 mg by mouth daily with breakfast.     Yes Historical Provider, MD  glipiZIDE (GLUCOTROL) 5 MG tablet Take 1 tablet (5 mg total) by mouth daily before breakfast.   Yes Camelia Eng Tysinger, PA-C  hydrALAZINE (APRESOLINE) 50 MG tablet Take 50 mg by mouth 3 (three) times daily.   Yes Historical Provider, MD  latanoprost  (XALATAN) 0.005 % ophthalmic solution Place 1 drop into the right eye at bedtime.     Yes Historical Provider, MD  levothyroxine (SYNTHROID, LEVOTHROID) 75 MCG tablet Take 75 mcg by mouth daily before breakfast.   Yes Historical Provider, MD  lisinopril (PRINIVIL,ZESTRIL) 10 MG tablet Take 30 mg by mouth daily.  Yes Historical Provider, MD  loratadine (CLARITIN) 10 MG tablet Take 10 mg by mouth daily.   Yes Historical Provider, MD  Multiple Vitamins-Minerals (MULTIVITAMIN WITH MINERALS) tablet Take 1 tablet by mouth daily.   Yes Historical Provider, MD  nortriptyline (PAMELOR) 50 MG capsule Take 1 capsule (50 mg total) by mouth at bedtime. 09/02/13  Yes Denita Lung, MD  Omega-3 Fatty Acids (FISH OIL) 1000 MG CAPS Take 1,000 mg by mouth daily.   Yes Historical Provider, MD  pantoprazole (PROTONIX) 20 MG tablet Take 1 tablet (20 mg total) by mouth 2 (two) times daily before a meal. 12/01/13  Yes Costin Karlyne Greenspan, MD  PARoxetine (PAXIL) 40 MG tablet TAKE 1 TABLET EVERY MORNING   Yes Camelia Eng Tysinger, PA-C   BP 112/43  Pulse 73  Temp(Src) 97.7 F (36.5 C) (Oral)  Resp 18  SpO2 98% Physical Exam  Nursing note and vitals reviewed. Constitutional: He is oriented to person, place, and time. He appears well-developed and well-nourished.  HENT:  Head: Normocephalic and atraumatic.  Right Ear: External ear normal.  Left Ear: External ear normal.  Nose: Nose normal.  Eyes: Right eye exhibits no discharge. Left eye exhibits no discharge.  Neck: Neck supple.  Cardiovascular: Normal rate and intact distal pulses.   Pulses:      Dorsalis pedis pulses are 2+ on the right side.  Pulmonary/Chest: Effort normal.  Abdominal: Soft. He exhibits no distension.  Musculoskeletal: He exhibits no edema.       Feet:  Neurological: He is alert and oriented to person, place, and time.  Skin: Skin is warm and dry.    ED Course  LACERATION REPAIR Date/Time: 01/06/2014 9:41 PM Performed by: Sherwood Gambler  T Authorized by: Sherwood Gambler T Consent: Verbal consent obtained. Risks and benefits: risks, benefits and alternatives were discussed Time out: Immediately prior to procedure a "time out" was called to verify the correct patient, procedure, equipment, support staff and site/side marked as required. Body area: lower extremity Location details: right foot Laceration length: 5 cm Foreign bodies: no foreign bodies Tendon involvement: none Nerve involvement: none Vascular damage: no Anesthesia: local infiltration Local anesthetic: lidocaine 2% with epinephrine Anesthetic total: 2 ml Patient sedated: no Preparation: Patient was prepped and draped in the usual sterile fashion. Irrigation solution: saline Irrigation method: syringe Amount of cleaning: standard Debridement: none Degree of undermining: none Skin closure: 4-0 Prolene Number of sutures: 10 Technique: simple Approximation: close Approximation difficulty: simple Dressing: 4x4 sterile gauze and antibiotic ointment Patient tolerance: Patient tolerated the procedure well with no immediate complications.   (including critical care time) Labs Review Labs Reviewed - No data to display  Imaging Review Dg Foot Complete Right  01/06/2014   CLINICAL DATA:  Fall.  Laceration.  EXAM: RIGHT FOOT COMPLETE - 3+ VIEW  COMPARISON:  None.  FINDINGS: Diffuse degenerative changes noted of the right foot. Degenerative changes are particularly severe about the first digit. Prominent callus formation noted about the first metatarsal, this may be from old trauma. Postsurgical changes at the distal aspect of second and third metatarsals.  IMPRESSION: Diffuse degenerative and postsurgical changes right foot. No acute abnormality.   Electronically Signed   By: Marcello Moores  Register   On: 01/06/2014 20:55     EKG Interpretation None      MDM   Final diagnoses:  Foot laceration, right, initial encounter    Patient with uncomplicated laceration  after mechanical fall (leg "gave out"). No head or neck injury.  No foreign bodies. Tetanus up to date. Will repair and send to PCP for removal.    Ephraim Hamburger, MD 01/07/14 [phone]

## 2014-01-06 NOTE — ED Notes (Signed)
Right foot sutures dressed with bacitracin and dry dressing. Wound clean and dry.

## 2014-01-06 NOTE — ED Notes (Signed)
Bed: WA21 Expected date:  Expected time:  Means of arrival:  Comments: EMS fall  

## 2014-01-07 ENCOUNTER — Encounter: Payer: Self-pay | Admitting: Family Medicine

## 2014-01-12 ENCOUNTER — Ambulatory Visit: Payer: Self-pay | Admitting: Family Medicine

## 2014-01-15 ENCOUNTER — Encounter: Payer: Self-pay | Admitting: Family Medicine

## 2014-01-15 ENCOUNTER — Ambulatory Visit (INDEPENDENT_AMBULATORY_CARE_PROVIDER_SITE_OTHER): Payer: Medicare Other | Admitting: Family Medicine

## 2014-01-15 VITALS — BP 132/60 | HR 65 | Wt 230.0 lb

## 2014-01-15 DIAGNOSIS — S91309A Unspecified open wound, unspecified foot, initial encounter: Secondary | ICD-10-CM

## 2014-01-15 DIAGNOSIS — Z5189 Encounter for other specified aftercare: Secondary | ICD-10-CM

## 2014-01-15 DIAGNOSIS — S91311D Laceration without foreign body, right foot, subsequent encounter: Secondary | ICD-10-CM

## 2014-01-15 DIAGNOSIS — Z9181 History of falling: Secondary | ICD-10-CM

## 2014-01-15 NOTE — Progress Notes (Signed)
   Subjective:    Patient ID: Zachary Vargas, male    DOB: 02-04-1928, 80 y.o.   MRN: [id number]  HPI He is here for recheck. He had sutures placed in his right foot approximately 10 days ago and is here for suture removal. He fell injuring his foot requiring the sutures. He apparently has had 2 falls requiring EMS to be called to help him. There is also evidence that police were called mainly because his wife tried to reach him and he did not answer. She therefore called police to have them check on him. He has been getting help from [place] home care however they have stopped apparently due to his noncompliance. He is also had an evaluation by [facility] however there has been no followup due to other agencies being involved. Also of note is that he has no further history of diarrhea. He does admit to drinking and states he does this because he finds pleasurable. He's not on any sleep medications.   Review of Systems     Objective:   Physical Exam Alert and in no distress with appropriate affect. In fact he actually looks better than on several previous occasions. The foot laceration is healing however I am reluctant to pull the sutures out.      Assessment & Plan:  Foot laceration, right, subsequent encounter  History of recent fall  he will return here in several days for suture removal. Also had a long discussion with him concerning his recent falls and the fact that assisted living I think would be appropriate. He states that the next time he falls he will go ahead and do this. Explained that the next time he falls it could be something very significant like hip fracture or even skull fracture. Strongly encouraged him to quit drinking. We will have [facility] revisit to see if there is any other services that can be provided.

## 2014-01-16 ENCOUNTER — Other Ambulatory Visit: Payer: Self-pay

## 2014-01-16 ENCOUNTER — Telehealth: Payer: Self-pay | Admitting: Family Medicine

## 2014-01-16 MED ORDER — GLIPIZIDE 5 MG PO TABS: 5.0000 mg | ORAL_TABLET | Freq: Every day | ORAL | Status: DC

## 2014-01-16 MED ORDER — HYDRALAZINE HCL 50 MG PO TABS: 50.0000 mg | ORAL_TABLET | Freq: Three times a day (TID) | ORAL | Status: DC

## 2014-01-16 MED ORDER — LISINOPRIL 10 MG PO TABS: 30.0000 mg | ORAL_TABLET | Freq: Every day | ORAL | Status: DC

## 2014-01-16 MED ORDER — LEVOTHYROXINE SODIUM 75 MCG PO TABS: 75.0000 ug | ORAL_TABLET | Freq: Every day | ORAL | Status: DC

## 2014-01-16 NOTE — Telephone Encounter (Signed)
Call her and explain what's going on and find out what her issues are.

## 2014-01-16 NOTE — Telephone Encounter (Signed)
Pt's wife called from [place] (she is on [place]). She states that she is getting calls from the police and EMS concerning the calls [place] is making to them. She is also getting calls from others like home health and rehab places.They were asking for her assistants. They are asking her to get him in behavioral health.  She states that she cant do anything for him and hasn't been able to for a long time. She states he has a friend that is bringing him alcohol. She states Antwyne's driver's licence is up for renewal and she is scared to death that he will get them online, drive and hurt someone.  She is asking for some assistance is how to handle all this. She is living in [place] and feels overwhelmed by the calls she is receiving and doesn't know how to proceed.  Pt's wife can be reached at [phone].

## 2014-01-16 NOTE — Telephone Encounter (Signed)
ROSE CALLED AND WE WENT OVER HIS MEDS AND SHE HAD ME REORDER MEDS

## 2014-01-16 NOTE — Telephone Encounter (Signed)
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

## 2014-01-19 ENCOUNTER — Telehealth: Payer: Self-pay | Admitting: Family Medicine

## 2014-01-19 NOTE — Telephone Encounter (Signed)
Pt called and requested a [facility]

## 2014-01-19 NOTE — Telephone Encounter (Signed)
Explain to him that under the present circumstances with his falls, a sleep med is not appropriate

## 2014-01-19 NOTE — Telephone Encounter (Signed)
PT WAS EXPLAINED THAT IT WAS NOT APPROPRIATE FOR THE SLEEP MED HE VERBALIZED UNDERSTANDING

## 2014-01-20 ENCOUNTER — Ambulatory Visit (INDEPENDENT_AMBULATORY_CARE_PROVIDER_SITE_OTHER): Payer: Medicare Other | Admitting: Family Medicine

## 2014-01-20 DIAGNOSIS — Z4802 Encounter for removal of sutures: Secondary | ICD-10-CM

## 2014-01-20 NOTE — Progress Notes (Signed)
   Subjective:    Patient ID: Zachary Vargas, male    DOB: March 14, 1928, 80 y.o.   MRN: [id number]  HPI He is here for suture removal. On his last visit the sutures were not ready to be removed. Since his last visit he is also been visited by the nurse from [facility]. He is happy with her.   Review of Systems     Objective:   Physical Exam The sutures were removed from the wound in the right foot near the medial aspect of the MTP joint. The wound is healing nicely.       Assessment & Plan:  Visit for suture removal  return here as needed.

## 2014-01-22 ENCOUNTER — Telehealth: Payer: Self-pay | Admitting: Family Medicine

## 2014-01-22 NOTE — Telephone Encounter (Signed)
Go ahead and set this up

## 2014-01-22 NOTE — Telephone Encounter (Signed)
FAXED REFERRAL TO [facility]

## 2014-01-23 ENCOUNTER — Telehealth: Payer: Self-pay | Admitting: Family Medicine

## 2014-01-23 NOTE — Telephone Encounter (Signed)
Cheri, the [facility] nurse Rose called and said that she received your fax asking them to up home Physical therapy. She said that they don't do that. She said you will need to set this up with a home health service. She said to also let you that he has called her twice asking about the home physical therapy. CLS

## 2014-01-26 NOTE — Telephone Encounter (Signed)
I HAVE FAXED ALL INFORMATION TO [facility] FOR P/T

## 2014-01-31 ENCOUNTER — Emergency Department (HOSPITAL_COMMUNITY): Payer: Medicare Other

## 2014-01-31 ENCOUNTER — Observation Stay (HOSPITAL_COMMUNITY)
Admission: EM | Admit: 2014-01-31 | Discharge: 2014-02-01 | Disposition: A | Discharge status: Home / Self Care | Payer: Medicare Other | Attending: Internal Medicine | Admitting: Internal Medicine

## 2014-01-31 ENCOUNTER — Encounter (HOSPITAL_COMMUNITY): Payer: Self-pay | Admitting: Emergency Medicine

## 2014-01-31 DIAGNOSIS — Z8701 Personal history of pneumonia (recurrent): Secondary | ICD-10-CM | POA: Insufficient documentation

## 2014-01-31 DIAGNOSIS — E1169 Type 2 diabetes mellitus with other specified complication: Secondary | ICD-10-CM | POA: Diagnosis present

## 2014-01-31 DIAGNOSIS — Z79899 Other long term (current) drug therapy: Secondary | ICD-10-CM | POA: Diagnosis not present

## 2014-01-31 DIAGNOSIS — N189 Chronic kidney disease, unspecified: Secondary | ICD-10-CM | POA: Diagnosis not present

## 2014-01-31 DIAGNOSIS — F3289 Other specified depressive episodes: Secondary | ICD-10-CM | POA: Insufficient documentation

## 2014-01-31 DIAGNOSIS — Z8711 Personal history of peptic ulcer disease: Secondary | ICD-10-CM | POA: Diagnosis not present

## 2014-01-31 DIAGNOSIS — E039 Hypothyroidism, unspecified: Secondary | ICD-10-CM | POA: Diagnosis present

## 2014-01-31 DIAGNOSIS — E860 Dehydration: Secondary | ICD-10-CM | POA: Diagnosis not present

## 2014-01-31 DIAGNOSIS — Z87891 Personal history of nicotine dependence: Secondary | ICD-10-CM | POA: Diagnosis not present

## 2014-01-31 DIAGNOSIS — G4700 Insomnia, unspecified: Secondary | ICD-10-CM | POA: Insufficient documentation

## 2014-01-31 DIAGNOSIS — E1122 Type 2 diabetes mellitus with diabetic chronic kidney disease: Secondary | ICD-10-CM

## 2014-01-31 DIAGNOSIS — F411 Generalized anxiety disorder: Secondary | ICD-10-CM | POA: Insufficient documentation

## 2014-01-31 DIAGNOSIS — K5900 Constipation, unspecified: Secondary | ICD-10-CM | POA: Insufficient documentation

## 2014-01-31 DIAGNOSIS — I129 Hypertensive chronic kidney disease with stage 1 through stage 4 chronic kidney disease, or unspecified chronic kidney disease: Secondary | ICD-10-CM | POA: Insufficient documentation

## 2014-01-31 DIAGNOSIS — R1084 Generalized abdominal pain: Secondary | ICD-10-CM

## 2014-01-31 DIAGNOSIS — M1A00X1 Idiopathic chronic gout, unspecified site, with tophus (tophi): Secondary | ICD-10-CM | POA: Diagnosis present

## 2014-01-31 DIAGNOSIS — E785 Hyperlipidemia, unspecified: Secondary | ICD-10-CM

## 2014-01-31 DIAGNOSIS — E1159 Type 2 diabetes mellitus with other circulatory complications: Secondary | ICD-10-CM | POA: Diagnosis present

## 2014-01-31 DIAGNOSIS — Z8739 Personal history of other diseases of the musculoskeletal system and connective tissue: Secondary | ICD-10-CM | POA: Diagnosis not present

## 2014-01-31 DIAGNOSIS — K625 Hemorrhage of anus and rectum: Secondary | ICD-10-CM | POA: Diagnosis present

## 2014-01-31 DIAGNOSIS — M109 Gout, unspecified: Secondary | ICD-10-CM | POA: Insufficient documentation

## 2014-01-31 DIAGNOSIS — F329 Major depressive disorder, single episode, unspecified: Secondary | ICD-10-CM | POA: Insufficient documentation

## 2014-01-31 DIAGNOSIS — H409 Unspecified glaucoma: Secondary | ICD-10-CM | POA: Diagnosis not present

## 2014-01-31 DIAGNOSIS — K5649 Other impaction of intestine: Secondary | ICD-10-CM | POA: Diagnosis present

## 2014-01-31 DIAGNOSIS — I152 Hypertension secondary to endocrine disorders: Secondary | ICD-10-CM | POA: Diagnosis present

## 2014-01-31 DIAGNOSIS — E119 Type 2 diabetes mellitus without complications: Secondary | ICD-10-CM | POA: Diagnosis not present

## 2014-01-31 DIAGNOSIS — K439 Ventral hernia without obstruction or gangrene: Secondary | ICD-10-CM | POA: Diagnosis not present

## 2014-01-31 DIAGNOSIS — K219 Gastro-esophageal reflux disease without esophagitis: Secondary | ICD-10-CM | POA: Insufficient documentation

## 2014-01-31 DIAGNOSIS — I10 Essential (primary) hypertension: Secondary | ICD-10-CM | POA: Diagnosis present

## 2014-01-31 DIAGNOSIS — M48061 Spinal stenosis, lumbar region without neurogenic claudication: Secondary | ICD-10-CM

## 2014-01-31 DIAGNOSIS — Z8589 Personal history of malignant neoplasm of other organs and systems: Secondary | ICD-10-CM | POA: Diagnosis not present

## 2014-01-31 DIAGNOSIS — D649 Anemia, unspecified: Secondary | ICD-10-CM | POA: Diagnosis not present

## 2014-01-31 DIAGNOSIS — G629 Polyneuropathy, unspecified: Secondary | ICD-10-CM

## 2014-01-31 DIAGNOSIS — E1129 Type 2 diabetes mellitus with other diabetic kidney complication: Secondary | ICD-10-CM | POA: Diagnosis present

## 2014-01-31 DIAGNOSIS — N179 Acute kidney failure, unspecified: Secondary | ICD-10-CM | POA: Diagnosis present

## 2014-01-31 LAB — COMPREHENSIVE METABOLIC PANEL
ALT: 13 U/L (ref 0–53)
ANION GAP: 14 (ref 5–15)
AST: 19 U/L (ref 0–37)
Albumin: 3.2 g/dL — ABNORMAL LOW (ref 3.5–5.2)
Alkaline Phosphatase: 108 U/L (ref 39–117)
BUN: 43 mg/dL — AB (ref 6–23)
CO2: 18 meq/L — AB (ref 19–32)
Calcium: 9.5 mg/dL (ref 8.4–10.5)
Chloride: 105 mEq/L (ref 96–112)
Creatinine, Ser: 1.97 mg/dL — ABNORMAL HIGH (ref 0.50–1.35)
GFR, EST AFRICAN AMERICAN: 34 mL/min — AB (ref >90)
GFR, EST NON AFRICAN AMERICAN: 30 mL/min — AB (ref >90)
GLUCOSE: 92 mg/dL (ref 70–99)
Potassium: 5.3 mEq/L (ref 3.7–5.3)
Sodium: 137 mEq/L (ref 137–147)
TOTAL PROTEIN: 6.6 g/dL (ref 6.0–8.3)
Total Bilirubin: 0.2 mg/dL — ABNORMAL LOW (ref 0.3–1.2)

## 2014-01-31 LAB — I-STAT CHEM 8, ED
BUN: 41 mg/dL — AB (ref 6–23)
CALCIUM ION: 1.3 mmol/L (ref 1.13–1.30)
CREATININE: 2.1 mg/dL — AB (ref 0.50–1.35)
Chloride: 109 mEq/L (ref 96–112)
Glucose, Bld: 86 mg/dL (ref 70–99)
HCT: 34 % — ABNORMAL LOW (ref 39.0–52.0)
HEMOGLOBIN: 11.6 g/dL — AB (ref 13.0–17.0)
Potassium: 5.1 mEq/L (ref 3.7–5.3)
SODIUM: 139 meq/L (ref 137–147)
TCO2: 18 mmol/L (ref 0–100)

## 2014-01-31 LAB — POC OCCULT BLOOD, ED: Fecal Occult Bld: POSITIVE — AB

## 2014-01-31 LAB — CBC WITH DIFFERENTIAL/PLATELET
Basophils Absolute: 0 10*3/uL (ref 0.0–0.1)
Basophils Relative: 0 % (ref 0–1)
EOS ABS: 0.6 10*3/uL (ref 0.0–0.7)
EOS PCT: 6 % — AB (ref 0–5)
HEMATOCRIT: 30 % — AB (ref 39.0–52.0)
Hemoglobin: 9.7 g/dL — ABNORMAL LOW (ref 13.0–17.0)
LYMPHS ABS: 2.7 10*3/uL (ref 0.7–4.0)
LYMPHS PCT: 30 % (ref 12–46)
MCH: 30.2 pg (ref 26.0–34.0)
MCHC: 32.3 g/dL (ref 30.0–36.0)
MCV: 93.5 fL (ref 78.0–100.0)
MONOS PCT: 11 % (ref 3–12)
Monocytes Absolute: 1 10*3/uL (ref 0.1–1.0)
Neutro Abs: 4.7 10*3/uL (ref 1.7–7.7)
Neutrophils Relative %: 53 % (ref 43–77)
Platelets: 177 10*3/uL (ref 150–400)
RBC: 3.21 MIL/uL — AB (ref 4.22–5.81)
RDW: 17.3 % — ABNORMAL HIGH (ref 11.5–15.5)
WBC: 9 10*3/uL (ref 4.0–10.5)

## 2014-01-31 LAB — URINALYSIS, ROUTINE W REFLEX MICROSCOPIC
Bilirubin Urine: NEGATIVE
Glucose, UA: NEGATIVE mg/dL
HGB URINE DIPSTICK: NEGATIVE
Ketones, ur: NEGATIVE mg/dL
NITRITE: NEGATIVE
PROTEIN: 30 mg/dL — AB
SPECIFIC GRAVITY, URINE: 1.017 (ref 1.005–1.030)
UROBILINOGEN UA: 0.2 mg/dL (ref 0.0–1.0)
pH: 5 (ref 5.0–8.0)

## 2014-01-31 LAB — URINE MICROSCOPIC-ADD ON

## 2014-01-31 MED ORDER — FEBUXOSTAT 40 MG PO TABS
40.0000 mg | ORAL_TABLET | Freq: Every day | ORAL | Status: DC
  Administered 2014-02-01: 40 mg via ORAL
  Filled 2014-01-31: qty 1

## 2014-01-31 MED ORDER — SODIUM CHLORIDE 0.9 % IV BOLUS (SEPSIS)
500.0000 mL | Freq: Once | INTRAVENOUS | Status: AC
  Administered 2014-01-31: 500 mL via INTRAVENOUS

## 2014-01-31 MED ORDER — SODIUM CHLORIDE 0.9 % IV SOLN
INTRAVENOUS | Status: DC
  Administered 2014-01-31: 23:00:00 via INTRAVENOUS

## 2014-01-31 MED ORDER — OMEGA-3-ACID ETHYL ESTERS 1 G PO CAPS
1000.0000 mg | ORAL_CAPSULE | Freq: Every day | ORAL | Status: DC
  Administered 2014-02-01: 1000 mg via ORAL
  Filled 2014-01-31: qty 1

## 2014-01-31 MED ORDER — ONDANSETRON HCL 4 MG PO TABS: 4.0000 mg | ORAL_TABLET | Freq: Four times a day (QID) | ORAL | Status: DC | PRN

## 2014-01-31 MED ORDER — POLYETHYLENE GLYCOL 3350 17 G PO PACK
17.0000 g | PACK | Freq: Two times a day (BID) | ORAL | Status: DC
  Administered 2014-01-31 – 2014-02-01 (×2): 17 g via ORAL
  Filled 2014-01-31 (×3): qty 1

## 2014-01-31 MED ORDER — PAROXETINE HCL 20 MG PO TABS
40.0000 mg | ORAL_TABLET | Freq: Every day | ORAL | Status: DC
  Administered 2014-02-01: 40 mg via ORAL
  Filled 2014-01-31 (×3): qty 2

## 2014-01-31 MED ORDER — LORATADINE 10 MG PO TABS
10.0000 mg | ORAL_TABLET | Freq: Every day | ORAL | Status: DC
  Administered 2014-02-01: 10 mg via ORAL
  Filled 2014-01-31: qty 1

## 2014-01-31 MED ORDER — MINERAL OIL RE ENEM
1.0000 | ENEMA | Freq: Three times a day (TID) | RECTAL | Status: DC
  Administered 2014-02-01: 1 via RECTAL
  Filled 2014-01-31 (×4): qty 1

## 2014-01-31 MED ORDER — SODIUM CHLORIDE 0.9 % IV BOLUS (SEPSIS): 500.0000 mL | Freq: Once | INTRAVENOUS | Status: DC

## 2014-01-31 MED ORDER — FENTANYL CITRATE 0.05 MG/ML IJ SOLN
100.0000 ug | Freq: Once | INTRAMUSCULAR | Status: AC
  Administered 2014-01-31: 100 ug via INTRAVENOUS
  Filled 2014-01-31: qty 2

## 2014-01-31 MED ORDER — LATANOPROST 0.005 % OP SOLN
1.0000 [drp] | Freq: Every day | OPHTHALMIC | Status: DC
  Administered 2014-01-31: 1 [drp] via OPHTHALMIC
  Filled 2014-01-31: qty 2.5

## 2014-01-31 MED ORDER — DORZOLAMIDE HCL-TIMOLOL MAL 2-0.5 % OP SOLN
1.0000 [drp] | Freq: Two times a day (BID) | OPHTHALMIC | Status: DC
  Administered 2014-01-31 – 2014-02-01 (×2): 1 [drp] via OPHTHALMIC
  Filled 2014-01-31: qty 10

## 2014-01-31 MED ORDER — GLIPIZIDE 5 MG PO TABS
5.0000 mg | ORAL_TABLET | Freq: Every day | ORAL | Status: DC
  Administered 2014-02-01: 5 mg via ORAL
  Filled 2014-01-31 (×3): qty 1

## 2014-01-31 MED ORDER — DOCUSATE SODIUM 100 MG PO CAPS
100.0000 mg | ORAL_CAPSULE | Freq: Two times a day (BID) | ORAL | Status: DC
  Administered 2014-01-31 – 2014-02-01 (×2): 100 mg via ORAL
  Filled 2014-01-31 (×3): qty 1

## 2014-01-31 MED ORDER — PANTOPRAZOLE SODIUM 20 MG PO TBEC
20.0000 mg | DELAYED_RELEASE_TABLET | Freq: Two times a day (BID) | ORAL | Status: DC
  Administered 2014-02-01: 20 mg via ORAL
  Filled 2014-01-31 (×4): qty 1

## 2014-01-31 MED ORDER — ACETAMINOPHEN 500 MG PO TABS
500.0000 mg | ORAL_TABLET | Freq: Four times a day (QID) | ORAL | Status: DC | PRN
  Administered 2014-02-01: 500 mg via ORAL
  Filled 2014-01-31: qty 1

## 2014-01-31 MED ORDER — LISINOPRIL 20 MG PO TABS
30.0000 mg | ORAL_TABLET | Freq: Every day | ORAL | Status: DC
  Administered 2014-02-01: 30 mg via ORAL
  Filled 2014-01-31: qty 1

## 2014-01-31 MED ORDER — HYDRALAZINE HCL 50 MG PO TABS
50.0000 mg | ORAL_TABLET | Freq: Three times a day (TID) | ORAL | Status: DC
  Administered 2014-01-31 – 2014-02-01 (×2): 50 mg via ORAL
  Filled 2014-01-31 (×4): qty 1

## 2014-01-31 MED ORDER — ONDANSETRON HCL 4 MG/2ML IJ SOLN: 4.0000 mg | Freq: Four times a day (QID) | INTRAMUSCULAR | Status: DC | PRN

## 2014-01-31 MED ORDER — VITAMIN D3 25 MCG (1000 UNIT) PO TABS
1000.0000 [IU] | ORAL_TABLET | Freq: Every day | ORAL | Status: DC
  Administered 2014-02-01: 1000 [IU] via ORAL
  Filled 2014-01-31: qty 1

## 2014-01-31 MED ORDER — FLEET ENEMA 7-19 GM/118ML RE ENEM
1.0000 | ENEMA | Freq: Once | RECTAL | Status: AC
  Administered 2014-01-31: 1 via RECTAL
  Filled 2014-01-31: qty 1

## 2014-01-31 MED ORDER — ENOXAPARIN SODIUM 40 MG/0.4ML ~~LOC~~ SOLN
40.0000 mg | Freq: Every day | SUBCUTANEOUS | Status: DC
  Administered 2014-01-31: 40 mg via SUBCUTANEOUS
  Filled 2014-01-31 (×2): qty 0.4

## 2014-01-31 MED ORDER — NORTRIPTYLINE HCL 25 MG PO CAPS
50.0000 mg | ORAL_CAPSULE | Freq: Every day | ORAL | Status: DC
  Administered 2014-01-31: 50 mg via ORAL
  Filled 2014-01-31 (×2): qty 2

## 2014-01-31 MED ORDER — LEVOTHYROXINE SODIUM 75 MCG PO TABS
75.0000 ug | ORAL_TABLET | Freq: Every day | ORAL | Status: DC
  Administered 2014-02-01: 75 ug via ORAL
  Filled 2014-01-31 (×3): qty 1

## 2014-01-31 MED ORDER — FERROUS GLUCONATE 324 (38 FE) MG PO TABS
325.0000 mg | ORAL_TABLET | Freq: Every day | ORAL | Status: DC
  Administered 2014-02-01: 325 mg via ORAL
  Filled 2014-01-31 (×3): qty 1

## 2014-01-31 MED ORDER — COLCHICINE 0.6 MG PO TABS
0.6000 mg | ORAL_TABLET | Freq: Every day | ORAL | Status: DC
  Administered 2014-02-01: 0.6 mg via ORAL
  Filled 2014-01-31: qty 1

## 2014-01-31 NOTE — ED Provider Notes (Signed)
CSN: [id number]     Arrival date & time 01/31/14  1536 History   First MD Initiated Contact with Patient 01/31/14 1538     Chief Complaint  Patient presents with  . Rectal Bleeding     (Consider location/radiation/quality/duration/timing/severity/associated sxs/prior Treatment) HPI Comments: Patient with history AAA repair, peptic ulcer disease, heme positive stools -- resents with complaint of constipation for 6 days, rectal pain today, blood noted on toilet tissue after having very small hard bowel movement this morning, no large amount of bleeding. Patient had EGD and colonoscopy 2 months ago after having heme positive stools. He had questionable small AVM and hemorrhoids noted on colonoscopy no active bleeding. EGD only showed mild gastritis. He is not currently on chronic narcotic pain medications. He has not had nausea or vomiting. He has abdominal tenderness. No fever or urinary symptoms. He is passing gas. The onset of this condition was acute. The course is constant. Aggravating factors: none. Alleviating factors: none.    The history is provided by the patient and medical records.    Past Medical History  Diagnosis Date  . Glaucoma     uses Eye Drops daily  . Dyslipidemia     statin intolerant  . Gout     takes Colchicine and Uloric daily  . BPH (benign prostatic hyperplasia)     [facility]  . History of peptic ulcer disease   . Peptic ulcer disease   . Diverticulosis   . Lumbar spinal stenosis     Most recent MRI 12/ 2011; [facility] - Dr. Nelva Bush  . Carotid atherosclerosis     Dopplers 11/09 showing 50-69% stenosis  . Peripheral arterial disease     [place] health care consult 1/08  . Epigastric pain     Hospitalization 7/11 [facility]  . Arthritis     Followed by [facility]  . Myeloma     IgA related; Dr. Marin Olp - 2009  . Headache(784.0)     rarely  . Constipation     but doesn't take any meds  . Urinary frequency   . Anxiety     takes Paxil daily   . Insomnia     takes Ambien nightly   . Anemia     of chronic disease;takes Ferrous Gluconate daily  . Osteomyelitis   . Hypertension     takes Lisinopril daily  . Peripheral edema     takes Furosemide daily  . Pneumonia     hx of-about 48yrs ago  . Peripheral neuropathy   . Joint pain   . Joint swelling   . Chronic back pain     spinal stenosis  . Chronic kidney disease     not on dialysis  . Hypothyroidism     takes Synthroid daily  . GERD (gastroesophageal reflux disease)     takes Protonix daily  . Depression   . Gout   . Diabetes mellitus     takes glipizide daily, Type 2  . Cataract   . Substance abuse     alcohol   Past Surgical History  Procedure Laterality Date  . Abdominal aortic aneurysm repair  03/1998  . Abdominal hernia repair  2000  . Renal stone surgery  1999  . Nerve root block      Multiple for lumbar spine  . Colonoscopy  07/22/2004    due repeat 2011; Dr. Teena Irani, [facility]  . Eye surgery      bil cataract surgery  . Amputation Left  11/29/2012    Procedure: Left Foot, Great Toe Amputation at MTP Joint;  Surgeon: Newt Minion, MD;  Location: [place];  Service: Orthopedics;  Laterality: Left;  LEFT  Foot, Great Toe Amputation at MTP Joint  . Amputation Left 01/03/2013    Procedure: Left foot 2nd toe amputation at metatarsophalangeal;  Surgeon: Newt Minion, MD;  Location: [place];  Service: Orthopedics;  Laterality: Left;  . Tonsillectomy    . Vasectomy    . Amputation Left 03/19/2013    Procedure: AMPUTATION DIGIT;  Surgeon: Newt Minion, MD;  Location: [place];  Service: Orthopedics;  Laterality: Left;  Left foot Amputation 3, 4, 5th Toes at MTP joint  . Esophagogastroduodenoscopy    . Total knee arthroplasty Right 10/15/2013    Procedure: TOTAL KNEE ARTHROPLASTY;  Surgeon: Newt Minion, MD;  Location: [place];  Service: Orthopedics;  Laterality: Right;  Right Total Knee Arthroplasty  . Hernia repair N/A 2000    abdominal  . Colonoscopy N/A  12/01/2013    Procedure: COLONOSCOPY;  Surgeon: Jeryl Columbia, MD;  Location: [facility];  Service: Endoscopy;  Laterality: N/A;  . Esophagogastroduodenoscopy N/A 12/01/2013    Procedure: ESOPHAGOGASTRODUODENOSCOPY (EGD);  Surgeon: Jeryl Columbia, MD;  Location: [facility];  Service: Endoscopy;  Laterality: N/A;  . Esophagogastroduodenoscopy  12/01/2013   Family History  Problem Relation Age of Onset  . Diabetes Father   . Glaucoma Father   . Cancer Brother     1 died of lung cancer, 1 of colon, 1 of different cancer  . Glaucoma Daughter   . Glaucoma Son    History  Substance Use Topics  . Smoking status: Former Research scientist (life sciences)  . Smokeless tobacco: Never Used     Comment: quit smoking 7yrs ago  . Alcohol Use: Yes     Comment: 4 oz drink daily    Review of Systems  Constitutional: Negative for fever.  HENT: Negative for rhinorrhea and sore throat.   Eyes: Negative for redness.  Respiratory: Negative for cough.   Cardiovascular: Negative for chest pain.  Gastrointestinal: Positive for abdominal pain, constipation and blood in stool. Negative for nausea, vomiting and diarrhea.  Genitourinary: Negative for dysuria.  Musculoskeletal: Negative for myalgias.  Skin: Negative for rash.  Neurological: Negative for syncope, light-headedness and headaches.   Allergies  Morphine and related; Statins; and Allopurinol  Home Medications   Prior to Admission medications   Medication Sig Start Date End Date Taking? Authorizing Provider  acetaminophen (TYLENOL) 500 MG tablet Take 1 tablet (500 mg total) by mouth every 6 (six) hours as needed for mild pain. 10/17/13   Newt Minion, MD  cholecalciferol (VITAMIN D) 1000 UNITS tablet Take 1,000 Units by mouth daily.    Historical Provider, MD  colchicine 0.6 MG tablet Take 0.6 mg by mouth daily.    Historical Provider, MD  docusate sodium (COLACE) 100 MG capsule Take 100 mg by mouth 2 (two) times daily.    Historical Provider, MD  dorzolamide-timolol  (COSOPT) 22.3-6.8 MG/ML ophthalmic solution Place 1 drop into both eyes 2 (two) times daily.     Historical Provider, MD  febuxostat (ULORIC) 40 MG tablet Take 40 mg by mouth daily.    Historical Provider, MD  ferrous gluconate (FERGON) 325 MG tablet Take 325 mg by mouth daily with breakfast.      Historical Provider, MD  glipiZIDE (GLUCOTROL) 5 MG tablet Take 1 tablet (5 mg total) by mouth daily before breakfast. 01/16/14  Denita Lung, MD  hydrALAZINE (APRESOLINE) 50 MG tablet Take 1 tablet (50 mg total) by mouth 3 (three) times daily. 01/16/14   Denita Lung, MD  latanoprost (XALATAN) 0.005 % ophthalmic solution Place 1 drop into the right eye at bedtime.      Historical Provider, MD  levothyroxine (SYNTHROID, LEVOTHROID) 75 MCG tablet Take 1 tablet (75 mcg total) by mouth daily before breakfast. 01/16/14   Denita Lung, MD  lisinopril (PRINIVIL,ZESTRIL) 10 MG tablet Take 3 tablets (30 mg total) by mouth daily. 01/16/14   Denita Lung, MD  loratadine (CLARITIN) 10 MG tablet Take 10 mg by mouth daily.    Historical Provider, MD  Multiple Vitamins-Minerals (MULTIVITAMIN WITH MINERALS) tablet Take 1 tablet by mouth daily.    Historical Provider, MD  nortriptyline (PAMELOR) 50 MG capsule Take 1 capsule (50 mg total) by mouth at bedtime. 09/02/13   Denita Lung, MD  Omega-3 Fatty Acids (FISH OIL) 1000 MG CAPS Take 1,000 mg by mouth daily.    Historical Provider, MD  pantoprazole (PROTONIX) 20 MG tablet Take 1 tablet (20 mg total) by mouth 2 (two) times daily before a meal. 12/01/13   Costin Karlyne Greenspan, MD  PARoxetine (PAXIL) 40 MG tablet TAKE 1 TABLET EVERY MORNING    David S Tysinger, PA-C   BP 142/77  Pulse 79  Temp(Src) 98 F (36.7 C) (Oral)  Resp 18  SpO2 95%  Physical Exam  Nursing note and vitals reviewed. Constitutional: He appears well-developed and well-nourished.  HENT:  Head: Normocephalic and atraumatic.  Eyes: Conjunctivae are normal. Right eye exhibits no discharge. Left eye  exhibits no discharge.  Neck: Normal range of motion. Neck supple.  Cardiovascular: Normal rate, regular rhythm and normal heart sounds.   Pulmonary/Chest: Effort normal and breath sounds normal.  Abdominal: Soft. He exhibits no distension. There is tenderness (mild) in the periumbilical area. There is no rigidity, no rebound, no guarding, no tenderness at McBurney's point and negative Murphy's sign. A hernia is present. Hernia confirmed positive in the ventral area (reducible).  Genitourinary: Rectal exam shows tenderness. Rectal exam shows no external hemorrhoid, no mass and anal tone normal. Guaiac negative stool. Prostate is not tender.  Neurological: He is alert.  Skin: Skin is warm and dry.  Psychiatric: He has a normal mood and affect.    ED Course  Procedures (including critical care time) Labs Review Labs Reviewed  CBC WITH DIFFERENTIAL - Abnormal; Notable for the following:    RBC 3.21 (*)    Hemoglobin 9.7 (*)    HCT 30.0 (*)    RDW 17.3 (*)    Eosinophils Relative 6 (*)    All other components within normal limits  COMPREHENSIVE METABOLIC PANEL - Abnormal; Notable for the following:    CO2 18 (*)    BUN 43 (*)    Creatinine, Ser 1.97 (*)    Albumin 3.2 (*)    Total Bilirubin 0.2 (*)    GFR calc non Af [name] 30 (*)    GFR calc Af [name] 34 (*)    All other components within normal limits  POC OCCULT BLOOD, ED - Abnormal; Notable for the following:    Fecal Occult Bld POSITIVE (*)    All other components within normal limits  I-STAT CHEM 8, ED - Abnormal; Notable for the following:    BUN 41 (*)    Creatinine, Ser 2.10 (*)    Hemoglobin 11.6 (*)    HCT 34.0 (*)  All other components within normal limits  URINALYSIS, ROUTINE W REFLEX MICROSCOPIC  SAMPLE TO BLOOD BANK    Imaging Review Ct Abdomen Pelvis Wo Contrast  01/31/2014   CLINICAL DATA:  Rectal bleeding since this morning. ; clinical constipation  EXAM: CT ABDOMEN AND PELVIS WITHOUT CONTRAST  TECHNIQUE:  Multidetector CT imaging of the abdomen and pelvis was performed following the standard protocol without IV contrast. The patient did receive oral contrast material.  COMPARISON:  Abdominal series of today's date  FINDINGS: The stomach is moderately distended with the oral contrast. Contrast has traversed portions of the small bowel. A few small bowel loops are mildly prominent there is no evidence of obstruction or wall thickening. The appendix is normal. There is fluid in the ascending and transverse portions of the colon. There is moderately increased stool burden in the rectosigmoid colon. The rectum is distended with stool in a fashion that may reflect a fecal impaction.  The liver, gallbladder, pancreas, spleen, adrenal glands, and kidneys exhibit no acute abnormalities. There are bilateral nonobstructing renal pelvis stones. The urinary bladder is normal. There is a left inguinal hernia containing fat. There are degenerative changes at multiple lumbar disc levels. There is superior endplate depression of L2. There is grade 1 anterolisthesis of L5 with respect to S1 due to bilateral L5 pars defects. The bony pelvis exhibits no acute abnormality.  The lung bases exhibit fibrotic changes bilaterally.  IMPRESSION: 1. The stool pattern within the rectosigmoid is consistent with constipation and there is likely a fecal impaction within the rectum. The bowel more proximally is within the limits of normal. There are no findings to suggest enteritis or colitis. 2. There is no acute hepatobiliary nor acute urinary tract abnormality. There are nonobstructing renal pelvis stones bilaterally. 3. There are degenerative changes of the lumbar spine and bilateral pars defects at L5.   Electronically Signed   By: David  Martinique   On: 01/31/2014 20:15   Dg Abd 2 Views  01/31/2014   CLINICAL DATA:  Constipation  EXAM: ABDOMEN - 2 VIEW  COMPARISON:  April 29, 2012 CT abdomen and pelvis  FINDINGS: Supine and left lateral  decubitus images were obtained. There is fairly mild stool in the colon. There are several loops of borderline dilated bowel. There are multiple air-fluid levels on the decubitus study. No free air. There are pelvic phleboliths.  IMPRESSION: Several borderline loops of bowel with multiple air-fluid levels. The appearance raises question of enteritis, although early obstruction could present similarly. No free air. There is only relatively mild amount of stool apparent in the colon.   Electronically Signed   By: Lowella Grip M.D.   On: 01/31/2014 17:09     EKG Interpretation None      3:46 PM Patient seen and examined. Work-up initiated. Medications ordered.   Vital signs reviewed and are as follows: BP 90/43  Pulse 76  Temp(Src) 98 F (36.7 C) (Oral)  Resp 20  SpO2 95%  4:03 PM Rectal performed with NT chaperone. Stool is not grossly bloody. Given hypotension, hold clot and 500cc bolus ordered. Patient is asymptomatic. He is uncomfortable from rectal pain.   6:07 PM Patient discussed with and seen by Dr. Mingo Amber. Hgb is stable. Given findings on abd x-ray, will order CT. No significant stooling with enema. CT ordered to eval for infection/bowel obstruction. Fentanyl given due to SOB with morphine. Cannot tell if he had dilaudid in past.    9:14 PM CT as above. Patient will  need admission for dehydration and bowel regimen. Patient agrees. [facility] hospitalist to admit.   MDM   Final diagnoses:  Acute kidney injury  Dehydration  Constipation, unspecified constipation type  Generalized abdominal pain   Admit.     Carlisle Cater, PA-C 01/31/14 2115

## 2014-01-31 NOTE — ED Notes (Signed)
Per EMS pt from home, noticed rectal bleeding around 10:00 am, dark red blood , sts was constipated LBM Monday, sts multiple episodes, denies pain, n/v/d.

## 2014-01-31 NOTE — ED Notes (Signed)
Patient had large bowel movement.

## 2014-01-31 NOTE — ED Provider Notes (Signed)
Medical screening examination/treatment/procedure(s) were conducted as a shared visit with non-physician practitioner(s) and myself.  I personally evaluated the patient during the encounter.   EKG Interpretation None      Patient here with abdominal pain, rectal bleeding when wiping. Some moderate abdominal pain also, diffuse pain on exam. No fevers, vomiting. Labs show worsening renal function. CT without obstruction, but is constipated. Admitted.   Evelina Bucy, MD 01/31/14 [phone]

## 2014-01-31 NOTE — ED Notes (Signed)
Bed: BI37 Expected date:  Expected time:  Means of arrival:  Comments: RM 17 elderly rectal bleed x 4 hours

## 2014-01-31 NOTE — H&P (Signed)
[facility]  History and Physical Zachary Vargas L. Zachary Perfect, MD Pager [phone] (if 7P to 7A, page night hospitalist on [email or web address] Vargas VZD:[id number] DOB: March 13, 1928 DOA: 01/31/2014  Referring physician:ED  PCP: Zachary Haste, MD   Chief Complaint: impaction   HPI:  Pt presents to ED w/ abd pain and no BM since 1 week prior . He has tried prune juice and milk of mag w/o success. He has tried no other OTC meds. On report, I was told that w/ enema in the ED there was minimal success and pt was having continued abd pain. [facility] was consulted to admit for observation of his impaction and dehydration (pt claims on drinking 2 cups water daily) w/ subsequent acute renal insufficiency. When I spoke w/ patient, his abd pain had resolved and the nurse reported he had just had a large BM. Per ED request he will be admitted . At this time he has no complaints   In ED his Cr was increased by about 1 point and his Hgb was stable  Chart Review:  ED notes   Review of Systems:  Neg except as noted in HPI   Past Medical History  Diagnosis Date  . Glaucoma     uses Eye Drops daily  . Dyslipidemia     statin intolerant  . Gout     takes Colchicine and Uloric daily  . BPH (benign prostatic hyperplasia)     [facility]  . History of peptic ulcer disease   . Peptic ulcer disease   . Diverticulosis   . Lumbar spinal stenosis     Most recent MRI 12/ 2011; [facility] - Dr. Nelva Bush  . Carotid atherosclerosis     Dopplers 11/09 showing 50-69% stenosis  . Peripheral arterial disease     [place] [facility] care consult 1/08  . Epigastric pain     Hospitalization 7/11 [facility]  . Arthritis     Followed by [facility]  . Myeloma     IgA related; Dr. Marin Olp - 2009  . Headache(784.0)     rarely  . Constipation     but doesn't take any meds  . Urinary frequency   . Anxiety     takes Paxil daily  . Insomnia     takes Ambien nightly   . Anemia     of chronic disease;takes  Ferrous Gluconate daily  . Osteomyelitis   . Hypertension     takes Lisinopril daily  . Peripheral edema     takes Furosemide daily  . Pneumonia     hx of-about 21yrs ago  . Peripheral neuropathy   . Joint pain   . Joint swelling   . Chronic back pain     spinal stenosis  . Chronic kidney disease     not on dialysis  . Hypothyroidism     takes Synthroid daily  . GERD (gastroesophageal reflux disease)     takes Protonix daily  . Depression   . Gout   . Diabetes mellitus     takes glipizide daily, Type 2  . Cataract   . Substance abuse     alcohol    Past Surgical History  Procedure Laterality Date  . Abdominal aortic aneurysm repair  03/1998  . Abdominal hernia repair  2000  . Renal stone surgery  1999  . Nerve root block      Multiple for lumbar spine  . Colonoscopy  07/22/2004    due repeat 2011; Dr. Teena Irani,  [employer]  . Eye surgery      bil cataract surgery  . Amputation Left 11/29/2012    Procedure: Left Foot, Great Toe Amputation at MTP Joint;  Surgeon: Newt Minion, MD;  Location: [place];  Service: Orthopedics;  Laterality: Left;  LEFT  Foot, Great Toe Amputation at MTP Joint  . Amputation Left 01/03/2013    Procedure: Left foot 2nd toe amputation at metatarsophalangeal;  Surgeon: Newt Minion, MD;  Location: [place];  Service: Orthopedics;  Laterality: Left;  . Tonsillectomy    . Vasectomy    . Amputation Left 03/19/2013    Procedure: AMPUTATION DIGIT;  Surgeon: Newt Minion, MD;  Location: [place];  Service: Orthopedics;  Laterality: Left;  Left foot Amputation 3, 4, 5th Toes at MTP joint  . Esophagogastroduodenoscopy    . Total knee arthroplasty Right 10/15/2013    Procedure: TOTAL KNEE ARTHROPLASTY;  Surgeon: Newt Minion, MD;  Location: [facility];  Service: Orthopedics;  Laterality: Right;  Right Total Knee Arthroplasty  . Hernia repair N/A 2000    abdominal  . Colonoscopy N/A 12/01/2013    Procedure: COLONOSCOPY;  Surgeon: Jeryl Columbia, MD;  Location: [facility];  Service: Endoscopy;  Laterality: N/A;  . Esophagogastroduodenoscopy N/A 12/01/2013    Procedure: ESOPHAGOGASTRODUODENOSCOPY (EGD);  Surgeon: Jeryl Columbia, MD;  Location: [facility];  Service: Endoscopy;  Laterality: N/A;  . Esophagogastroduodenoscopy  12/01/2013    Social History:  reports that he has quit smoking. He has never used smokeless tobacco. He reports that he drinks alcohol. He reports that he does not use illicit drugs.  Allergies  Allergen Reactions  . Morphine And Related Hives and Shortness Of Breath  . Statins Hives    Pt notes that "all" classes of cholesterol meds do the same   . Allopurinol Hives, Swelling and Rash    Family History  Problem Relation Age of Onset  . Diabetes Father   . Glaucoma Father   . Cancer Brother     1 died of lung cancer, 1 of colon, 1 of different cancer  . Glaucoma Daughter   . Glaucoma Son      Prior to Admission medications   Medication Sig Start Date End Date Taking? Authorizing Provider  acetaminophen (TYLENOL) 500 MG tablet Take 1 tablet (500 mg total) by mouth every 6 (six) hours as needed for mild pain. 10/17/13  Yes Newt Minion, MD  cholecalciferol (VITAMIN D) 1000 UNITS tablet Take 1,000 Units by mouth daily.   Yes Historical Provider, MD  colchicine 0.6 MG tablet Take 0.6 mg by mouth daily.   Yes Historical Provider, MD  docusate sodium (COLACE) 100 MG capsule Take 100 mg by mouth 2 (two) times daily.   Yes Historical Provider, MD  dorzolamide-timolol (COSOPT) 22.3-6.8 MG/ML ophthalmic solution Place 1 drop into both eyes 2 (two) times daily.    Yes Historical Provider, MD  febuxostat (ULORIC) 40 MG tablet Take 40 mg by mouth daily.   Yes Historical Provider, MD  ferrous gluconate (FERGON) 325 MG tablet Take 325 mg by mouth daily with breakfast.     Yes Historical Provider, MD  glipiZIDE (GLUCOTROL) 5 MG tablet Take 1 tablet (5 mg total) by mouth daily before breakfast. 01/16/14  Yes Denita Lung, MD   latanoprost (XALATAN) 0.005 % ophthalmic solution Place 1 drop into the right eye at bedtime.     Yes Historical Provider, MD  levothyroxine (SYNTHROID, LEVOTHROID) 75 MCG tablet Take  1 tablet (75 mcg total) by mouth daily before breakfast. 01/16/14  Yes Denita Lung, MD  lisinopril (PRINIVIL,ZESTRIL) 10 MG tablet Take 3 tablets (30 mg total) by mouth daily. 01/16/14  Yes Denita Lung, MD  loratadine (CLARITIN) 10 MG tablet Take 10 mg by mouth daily.   Yes Historical Provider, MD  Multiple Vitamins-Minerals (MULTIVITAMIN WITH MINERALS) tablet Take 1 tablet by mouth daily.   Yes Historical Provider, MD  nortriptyline (PAMELOR) 50 MG capsule Take 1 capsule (50 mg total) by mouth at bedtime. 09/02/13  Yes Denita Lung, MD  Omega-3 Fatty Acids (FISH OIL) 1000 MG CAPS Take 1,000 mg by mouth daily.   Yes Historical Provider, MD  pantoprazole (PROTONIX) 20 MG tablet Take 1 tablet (20 mg total) by mouth 2 (two) times daily before a meal. 12/01/13  Yes Costin Karlyne Greenspan, MD  PARoxetine (PAXIL) 40 MG tablet Take 40 mg by mouth every morning.   Yes Historical Provider, MD  hydrALAZINE (APRESOLINE) 50 MG tablet Take 50 mg by mouth 3 (three) times daily. 01/16/14   Denita Lung, MD   Physical Exam: Filed Vitals:   01/31/14 1536 01/31/14 1555 01/31/14 1802  BP:  90/43 142/77  Pulse:  76 79  Temp:  98 F (36.7 C)   TempSrc:  Oral   Resp:  20 18  SpO2: 93% 95% 95%     General:  WM in NAD   HEENT: dry MM, anicteric   Cardiovascular: RRR, no MRG   Respiratory: CTAB, no wheezes  Abdomen: soft, NT,ND  Skin: warm, dry  Musculoskeletal: absent toes on L foot   Wt Readings from Last 3 Encounters:  01/15/14 230 lb (104.327 kg)  12/01/13 254 lb 12.8 oz (115.577 kg)  12/01/13 254 lb 12.8 oz (115.577 kg)    Labs on Admission:  Basic Metabolic Panel:  Recent Labs Lab 01/31/14 1624 01/31/14 1702  NA 137 139  K 5.3 5.1  CL 105 109  CO2 18*  --   GLUCOSE 92 86  BUN 43* 41*  CREATININE  1.97* 2.10*  CALCIUM 9.5  --     Liver Function Tests:  Recent Labs Lab 01/31/14 1624  AST 19  ALT 13  ALKPHOS 108  BILITOT 0.2*  PROT 6.6  ALBUMIN 3.2*   No results found for this basename: LIPASE, AMYLASE,  in the last 168 hours No results found for this basename: AMMONIA,  in the last 168 hours  CBC:  Recent Labs Lab 01/31/14 1624 01/31/14 1702  WBC 9.0  --   NEUTROABS 4.7  --   HGB 9.7* 11.6*  HCT 30.0* 34.0*  MCV 93.5  --   PLT 177  --     Cardiac Enzymes: No results found for this basename: CKTOTAL, CKMB, CKMBINDEX, TROPONINI,  in the last 168 hours  Troponin (Point of Care Test) No results found for this basename: TROPIPOC,  in the last 72 hours  BNP (last 3 results) No results found for this basename: PROBNP,  in the last 8760 hours  CBG: No results found for this basename: GLUCAP,  in the last 168 hours   Radiological Exams on Admission: Ct Abdomen Pelvis Wo Contrast  01/31/2014   CLINICAL DATA:  Rectal bleeding since this morning. ; clinical constipation  EXAM: CT ABDOMEN AND PELVIS WITHOUT CONTRAST  TECHNIQUE: Multidetector CT imaging of the abdomen and pelvis was performed following the standard protocol without IV contrast. The patient did receive oral contrast material.  COMPARISON:  Abdominal series of today's date  FINDINGS: The stomach is moderately distended with the oral contrast. Contrast has traversed portions of the small bowel. A few small bowel loops are mildly prominent there is no evidence of obstruction or wall thickening. The appendix is normal. There is fluid in the ascending and transverse portions of the colon. There is moderately increased stool burden in the rectosigmoid colon. The rectum is distended with stool in a fashion that may reflect a fecal impaction.  The liver, gallbladder, pancreas, spleen, adrenal glands, and kidneys exhibit no acute abnormalities. There are bilateral nonobstructing renal pelvis stones. The urinary bladder  is normal. There is a left inguinal hernia containing fat. There are degenerative changes at multiple lumbar disc levels. There is superior endplate depression of L2. There is grade 1 anterolisthesis of L5 with respect to S1 due to bilateral L5 pars defects. The bony pelvis exhibits no acute abnormality.  The lung bases exhibit fibrotic changes bilaterally.  IMPRESSION: 1. The stool pattern within the rectosigmoid is consistent with constipation and there is likely a fecal impaction within the rectum. The bowel more proximally is within the limits of normal. There are no findings to suggest enteritis or colitis. 2. There is no acute hepatobiliary nor acute urinary tract abnormality. There are nonobstructing renal pelvis stones bilaterally. 3. There are degenerative changes of the lumbar spine and bilateral pars defects at L5.   Electronically Signed   By: David  Martinique   On: 01/31/2014 20:15   Dg Abd 2 Views  01/31/2014   CLINICAL DATA:  Constipation  EXAM: ABDOMEN - 2 VIEW  COMPARISON:  April 29, 2012 CT abdomen and pelvis  FINDINGS: Supine and left lateral decubitus images were obtained. There is fairly mild stool in the colon. There are several loops of borderline dilated bowel. There are multiple air-fluid levels on the decubitus study. No free air. There are pelvic phleboliths.  IMPRESSION: Several borderline loops of bowel with multiple air-fluid levels. The appearance raises question of enteritis, although early obstruction could present similarly. No free air. There is only relatively mild amount of stool apparent in the colon.   Electronically Signed   By: Lowella Grip M.D.   On: 01/31/2014 17:09      Active Problems:   Impaction of colon   Assessment/Plan 1. Impaction - enemas 3x per day. miralax BID. Colace daily. Repeat KUB tomorrow to monitor improvement. APAP prn pain  2. DM - home meds. Carb mod diet  3. Acute renal insuff - NS at 100cc/hr . Carb mod diet . Daily BMet   Code  Status: full Family Communication: none Disposition Plan/Anticipated LOS: 1-2 days  Time spent: 30 minutes   Velna Hatchet, MD  Internal Medicine Pager [phone] If 7PM-7AM, please contact night-coverage at [email or web address], password [facility] 01/31/2014, 9:30 PM

## 2014-01-31 NOTE — ED Notes (Signed)
He reports painful straining at b.m. Today with passage of few sm. Diarrhea stools, noting a minimal quantity of dark red blood on the paper with which he wiped.  He is awake, alert and in no distress. His abd. Is non-tender and he is in no distress.

## 2014-02-01 DIAGNOSIS — N189 Chronic kidney disease, unspecified: Secondary | ICD-10-CM | POA: Diagnosis not present

## 2014-02-01 DIAGNOSIS — E1129 Type 2 diabetes mellitus with other diabetic kidney complication: Secondary | ICD-10-CM

## 2014-02-01 DIAGNOSIS — I10 Essential (primary) hypertension: Secondary | ICD-10-CM

## 2014-02-01 DIAGNOSIS — N179 Acute kidney failure, unspecified: Secondary | ICD-10-CM | POA: Diagnosis present

## 2014-02-01 DIAGNOSIS — G609 Hereditary and idiopathic neuropathy, unspecified: Secondary | ICD-10-CM

## 2014-02-01 DIAGNOSIS — E1159 Type 2 diabetes mellitus with other circulatory complications: Secondary | ICD-10-CM | POA: Diagnosis present

## 2014-02-01 DIAGNOSIS — I152 Hypertension secondary to endocrine disorders: Secondary | ICD-10-CM | POA: Diagnosis present

## 2014-02-01 DIAGNOSIS — K5900 Constipation, unspecified: Secondary | ICD-10-CM

## 2014-02-01 DIAGNOSIS — M1A00X1 Idiopathic chronic gout, unspecified site, with tophus (tophi): Secondary | ICD-10-CM

## 2014-02-01 LAB — CBC
HCT: 28.4 % — ABNORMAL LOW (ref 39.0–52.0)
Hemoglobin: 9.2 g/dL — ABNORMAL LOW (ref 13.0–17.0)
MCH: 29.9 pg (ref 26.0–34.0)
MCHC: 32.4 g/dL (ref 30.0–36.0)
MCV: 92.2 fL (ref 78.0–100.0)
PLATELETS: 170 10*3/uL (ref 150–400)
RBC: 3.08 MIL/uL — AB (ref 4.22–5.81)
RDW: 17.2 % — AB (ref 11.5–15.5)
WBC: 8.1 10*3/uL (ref 4.0–10.5)

## 2014-02-01 LAB — BASIC METABOLIC PANEL
Anion gap: 11 (ref 5–15)
BUN: 37 mg/dL — ABNORMAL HIGH (ref 6–23)
CO2: 19 mEq/L (ref 19–32)
Calcium: 8.8 mg/dL (ref 8.4–10.5)
Chloride: 107 mEq/L (ref 96–112)
Creatinine, Ser: 1.46 mg/dL — ABNORMAL HIGH (ref 0.50–1.35)
GFR, EST AFRICAN AMERICAN: 49 mL/min — AB (ref >90)
GFR, EST NON AFRICAN AMERICAN: 43 mL/min — AB (ref >90)
Glucose, Bld: 107 mg/dL — ABNORMAL HIGH (ref 70–99)
Potassium: 4.3 mEq/L (ref 3.7–5.3)
SODIUM: 137 meq/L (ref 137–147)

## 2014-02-01 LAB — SAMPLE TO BLOOD BANK

## 2014-02-01 MED ORDER — POLYETHYLENE GLYCOL 3350 17 G PO PACK: 17.0000 g | PACK | Freq: Two times a day (BID) | ORAL | Status: AC

## 2014-02-01 NOTE — Progress Notes (Signed)
Patient discharged home, all discharge medications and instructions reviewed and questions answered.  Patient to be transported home via [facility].

## 2014-02-01 NOTE — Evaluation (Addendum)
Physical Therapy Evaluation Patient Details Name: Zachary Vargas MRN: [id number] DOB: 12/06/1927 Today's Date: 02/01/2014   History of Present Illness  *80yo admitted 8/22 for impaction,   Clinical Impression  Patient states he plans to return home with current [facility]  Following him. Reports falls , has lifeline. Pt will benefit from PT to address problems listed in notes below.    Follow Up Recommendations Home health PT    Equipment Recommendations  None recommended by PT    Recommendations for Other Services       Precautions / Restrictions Precautions Precautions: Fall Precaution Comments: functions from [facility]      Mobility  Bed Mobility Overal bed mobility: Modified Independent                Transfers Overall transfer level: Needs assistance   Transfers: Sit to/from Stand;Stand Pivot Transfers Sit to Stand: Min guard Stand pivot transfers: Min guard       General transfer comment: pt stood from bed and reached over to [facility] armrests and turned himself around slowly.  Ambulation/Gait                Hotel manager mobility: Yes Wheelchair propulsion: Both upper extremities;Both lower extermities Wheelchair parts: Independent Distance: 200' Wheelchair Assistance Details (indicate cue type and reason): independent  Modified Rankin (Stroke Patients Only)       Balance                                             Pertinent Vitals/Pain Pain Assessment: No/denies pain    Home Living Family/patient expects to be discharged to:: Private residence Living Arrangements: Alone Available Help at Discharge: Personal care attendant;Home health;Neighbor;Available PRN/intermittently Type of Home: House Home Access: Ramped entrance     Home Layout: Two level Home Equipment: Walker - 2 wheels;Bedside commode;Wheelchair - manual Additional Comments: stair glide    Prior  Function Level of Independence: Independent with assistive device(s)         Comments: independent, but significant h/o falls     Hand Dominance        Extremity/Trunk Assessment   Upper Extremity Assessment: Overall WFL for tasks assessed           Lower Extremity Assessment: Generalized weakness;LLE deficits/detail;RLE deficits/detail RLE Deficits / Details: decreased knee extension , wfl for standing LLE Deficits / Details: same as R     Communication   Communication: HOH  Cognition Arousal/Alertness: Awake/alert   Overall Cognitive Status: Within Functional Limits for tasks assessed                      General Comments      Exercises        Assessment/Plan    PT Assessment Patient needs continued PT services  PT Diagnosis Difficulty walking;Generalized weakness   PT Problem List Decreased activity tolerance;Decreased mobility  PT Treatment Interventions Functional mobility training;DME instruction;Therapeutic activities   PT Goals (Current goals can be found in the Care Plan section) Acute Rehab PT Goals Patient Stated Goal: to go back home, walk again PT Goal Formulation: With patient Time For Goal Achievement: 02/15/14 Potential to Achieve Goals: Good    Frequency Min 3X/week   Barriers to discharge Decreased caregiver support  Co-evaluation               End of Session Equipment Utilized During Treatment: Gait belt Activity Tolerance: Patient tolerated treatment well Patient left: in chair;with call bell/phone within reach Nurse Communication: Mobility status    Functional Assessment Tool Used: clinical judgement Functional Limitation: Mobility: Walking and moving around Mobility: Walking and Moving Around Current Status (C1638): At least 20 percent but less than 40 percent impaired, limited or restricted Mobility: Walking and Moving Around Goal Status [phone]): At least 1 percent but less than 20 percent impaired,  limited or restricted    Time: 1158-1210  PT Time Calculation (min): 12   Charges:         PT G Codes:   Functional Assessment Tool Used: clinical judgement Functional Limitation: Mobility: Walking and moving around    [place], Shella Maxim 02/01/2014, 1:56 PM Tresa Endo PT [phone]

## 2014-02-01 NOTE — Discharge Summary (Signed)
Physician Discharge Summary  Zachary Vargas DGU:[id number] DOB: 1927-08-29 DOA: 01/31/2014  PCP: Wyatt Haste, MD  Admit date: 01/31/2014 Discharge date: 02/01/2014   Recommendations for Outpatient Follow-Up:   1. Recommend reassessment of bowel function and adjustment of our regimen if needed to maintain good bowel habits.   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  Diet recommendation: Low sodium, heart healthy.  Carbohydrate-modified.     History of Present Illness:   80 year old man admitted with fecal impaction 01/31/14 after failing outpatient therapy with prune juice and milk of magnesia. The patient reported no bowel movement in one week and did not respond to an enema in the emergency department and therefore was referred as an admission/observation.   Hospital Course by Problem:   Principal Problem:   Impaction of colon  Patient was given a enema in the ED, and a short time after he was admitted, he moved his bowels.  Recommend MiraLAX twice a day at discharge with escalation of bowel regimen if needed to maintain healthy bowel habits.  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  Resume outpatient regimen with glipizide at discharge.    Dyslipidemia  Continue fish oil.    Hypothyroidism  Continue Synthroid.    Chronic gouty arthropathy with tophus (tophi)  Continue colchicine and uloric.    Peripheral neuropathy  Continue Pamelor.    Acute kidney injury  Hydrated overnight with improvement in renal function at discharge.  Baseline  creatinine approximately 1.2. Initial creatinine 2.10 and discharge creatinine 1.46.    Essential hypertension, benign  Continue hydralazine     Medical Consultants:    None.   Discharge Exam:   Filed Vitals:   02/01/14 1346  BP: 110/50  Pulse: 70  Temp: 97.9 F (36.6 C)  Resp: 18   Filed Vitals:   01/31/14 2222 01/31/14 2310 02/01/14 0540 02/01/14 1346  BP:  147/71 136/55 110/50  Pulse: 78 82 81 70  Temp:  97.8 F (36.6 C) 97.6 F (36.4 C) 97.9 F (36.6 C)  TempSrc:  Oral Oral Oral  Resp:  20 20 18   Height:  6\' 3"  (1.905 m)    SpO2: 99% 100% 99% 100%    Gen:  NAD, sleepy Cardiovascular:  RRR, No M/R/G Respiratory: Lungs CTAB Gastrointestinal: Abdomen soft, NT/ND with normal active bowel sounds. Extremities: No C/E/C   The results of significant diagnostics from this hospitalization (including imaging, microbiology, ancillary and laboratory) are listed below for reference.     Procedures and Diagnostic Studies:    Ct Abdomen Pelvis Wo Contrast 01/31/2014: 1. The stool pattern within the rectosigmoid is consistent with constipation and there is likely a fecal impaction within the rectum. The bowel more proximally is within the limits of normal. There are no findings to suggest enteritis or colitis. 2. There is no acute hepatobiliary nor acute urinary tract abnormality. There are nonobstructing renal pelvis stones bilaterally. 3. There are degenerative changes of the lumbar spine and bilateral pars defects at L5.       Dg Abd 2 Views 01/31/2014: Several borderline loops of  bowel with multiple air-fluid levels. The appearance raises question of enteritis, although early obstruction could present similarly. No free air. There is only relatively mild amount of stool apparent in the colon.     Labs:   Basic Metabolic Panel:  Recent Labs Lab 01/31/14 1624 01/31/14 1702 02/01/14 0428  NA 137 139 137  K 5.3 5.1 4.3  CL 105 109 107  CO2 18*  --  19  GLUCOSE  92 86 107*  BUN 43* 41* 37*  CREATININE 1.97* 2.10* 1.46*  CALCIUM 9.5  --  8.8   GFR The CrCl is unknown because both a height and weight (above a minimum accepted value) are required for this calculation. Liver Function Tests:  Recent Labs Lab 01/31/14 1624  AST 19  ALT 13  ALKPHOS 108  BILITOT 0.2*  PROT 6.6  ALBUMIN 3.2*   CBC:  Recent Labs Lab 01/31/14 1624 01/31/14 1702 02/01/14 0428  WBC 9.0  --  8.1  NEUTROABS 4.7  --   --   HGB 9.7* 11.6* 9.2*  HCT 30.0* 34.0* 28.4*  MCV 93.5  --  92.2  PLT 177  --  170     Discharge Instructions:       Discharge Instructions   Call MD for:    Complete by:  As directed   Recurrent problems with constipation.     Diet - low sodium heart healthy    Complete by:  As directed      Diet Carb Modified    Complete by:  As directed      Discharge instructions    Complete by:  As directed   Monitor how often you move your bowels, and if you do not move your bowels at least every other day, notify your doctor.     Increase activity slowly    Complete by:  As directed      Walk with assistance    Complete by:  As directed      Walker     Complete by:  As directed             Medication List         acetaminophen 500 MG tablet  Commonly known as:  TYLENOL  Take 1 tablet (500 mg total) by mouth every 6 (six) hours as needed for mild pain.     cholecalciferol 1000 UNITS tablet  Commonly known as:  VITAMIN D  Take 1,000 Units by mouth daily.     colchicine 0.6 MG tablet  Take 0.6 mg by mouth daily.     docusate sodium 100 MG capsule  Commonly known as:  COLACE  Take 100 mg by mouth 2 (two) times daily.     dorzolamide-timolol 22.3-6.8 MG/ML ophthalmic solution  Commonly known as:  COSOPT  Place 1 drop into both eyes 2 (two) times daily.     febuxostat 40 MG tablet  Commonly known as:  ULORIC  Take 40 mg by mouth daily.     ferrous gluconate 325 MG tablet  Commonly known as:  FERGON  Take 325 mg by  mouth daily with breakfast.     Fish Oil 1000 MG Caps  Take 1,000 mg by mouth daily.     glipiZIDE 5 MG tablet  Commonly known as:  GLUCOTROL  Take 1 tablet (5 mg total) by mouth daily before breakfast.     hydrALAZINE 50 MG tablet  Commonly known as:  APRESOLINE  Take 50 mg by mouth 3 (  three) times daily.     latanoprost 0.005 % ophthalmic solution  Commonly known as:  XALATAN  Place 1 drop into the right eye at bedtime.     levothyroxine 75 MCG tablet  Commonly known as:  SYNTHROID, LEVOTHROID  Take 1 tablet (75 mcg total) by mouth daily before breakfast.     lisinopril 10 MG tablet  Commonly known as:  PRINIVIL,ZESTRIL  Take 3 tablets (30 mg total) by mouth daily.     loratadine 10 MG tablet  Commonly known as:  CLARITIN  Take 10 mg by mouth daily.     multivitamin with minerals tablet  Take 1 tablet by mouth daily.     nortriptyline 50 MG capsule  Commonly known as:  PAMELOR  Take 1 capsule (50 mg total) by mouth at bedtime.     pantoprazole 20 MG tablet  Commonly known as:  PROTONIX  Take 1 tablet (20 mg total) by mouth 2 (two) times daily before a meal.     PARoxetine 40 MG tablet  Commonly known as:  PAXIL  Take 40 mg by mouth every morning.     polyethylene glycol packet  Commonly known as:  MIRALAX / GLYCOLAX  Take 17 g by mouth 2 (two) times daily.       Follow-up Information   Schedule an appointment as soon as possible for a visit with Wyatt Haste, MD. (As needed for recurrent constipation.)    Specialty:  Family Medicine   Contact information:   [address] [place] [address] [phone]        Time coordinating discharge: 25 minutes.  Signed:  Freya Zobrist  Pager [phone] [facility] 02/01/2014, 3:02 PM

## 2014-02-01 NOTE — Progress Notes (Signed)
Clinical Social Work  CSW spoke with RN who reports patient is DC today and needs non-emergency transportation home. Patient reports that he usually uses [facility] and is aware of no guarantee of payment. Patient reports he has keys to get into his home and no family needs to be contacted re: DC. RN is ready for patient to DC. CSW arranged non-emergency transportation and is signing off.  Sindy Messing, LCSW (Weekend Coverage)

## 2014-02-01 NOTE — Discharge Instructions (Signed)

## 2014-02-01 NOTE — Progress Notes (Signed)
Jaidan Stachnik, RN, BSN Nurse Case Manager Utilization Review Completed.    

## 2014-02-03 ENCOUNTER — Telehealth: Payer: Self-pay | Admitting: Internal Medicine

## 2014-02-03 NOTE — Telephone Encounter (Signed)
Have the nurse continue to follow him. He can take Tylenol for the pain otherwise I don't have much I can offer without seeing him

## 2014-02-03 NOTE — Telephone Encounter (Signed)
[facility] called stating that she went over there after his fall and pt states hes having a lot of right shoulder pain and has skin tears on his elbow which the nurse put tegaderm on it, but he is just weak and was in the hospital the other day for 2 days. i have offered an appt to pt but pt doesn't not have anyone to bring him or the money for a cab or to come in. What do you want to do

## 2014-02-03 NOTE — Telephone Encounter (Signed)
Vertebral order was given to start assisted living placement.

## 2014-02-03 NOTE — Telephone Encounter (Signed)
Left message on beth vm about Dr. Redmond School recommendations

## 2014-02-03 NOTE — Telephone Encounter (Signed)
Juliann Pulse from [facility] called stating that she got a call from pt today for a fall that he had. EMS went over to get him off the floor in the bathroom and he is shaken up a bit and pt has now agreed to get help in getting put in assisting living and they are wanting a verbal order for social worker to get involved to help with placement. Please call Juliann Pulse with verbal order @ [phone]

## 2014-02-05 ENCOUNTER — Telehealth: Payer: Self-pay | Admitting: Family Medicine

## 2014-02-05 NOTE — Telephone Encounter (Signed)
Pt called said he received a box of Addipak in the mail and did not know what this was for.  I googled it and it is salt water.  He states it is for inhalation.  After review of chart I do not see that we ordered it.  Pt states Home Health will be there on Monday so I asked him to show it to them.  And he said he would.

## 2014-02-09 ENCOUNTER — Telehealth: Payer: Self-pay

## 2014-02-09 NOTE — Telephone Encounter (Signed)
Have him use MiraLax for his constipation and he should be on the 50 mg of nortriptyline.

## 2014-02-09 NOTE — Telephone Encounter (Signed)
PT SAID HE IS USING MIRALAX BUT NOT WORKING

## 2014-02-09 NOTE — Telephone Encounter (Signed)
BARBRA FROM [facility] CALL SAID Zachary Vargas IS COMPLAINING OF CONSTIPATION LAS BM WED. LAST WEEK , NOT SLEEPING WELL,DEPRESION AND NORATRIPLYN HE IS TAKING BID BECAUSE IF HE DOESNT THEN HIS FEET BURN PLEASE ADVISE

## 2014-02-09 NOTE — Telephone Encounter (Signed)
Have him keep using the MiraLax and he can add Colace to this

## 2014-02-10 NOTE — Telephone Encounter (Signed)
Pt was notified [facility] recommendations

## 2014-02-13 ENCOUNTER — Telehealth: Payer: Self-pay | Admitting: Internal Medicine

## 2014-02-13 NOTE — Telephone Encounter (Signed)
Pt states that he has been urinating all through the night for the last couple of nights every 30 minutes and wants to know can it be the hydroxyzine or melatonin that he takes at night doing this to him? Pt know you are out of the office and won't be back in til Tuesday and states he can wait til then.

## 2014-02-17 NOTE — Telephone Encounter (Signed)
Let him know that neither of these should cause him to urinate. If this continues, he will need to be seen

## 2014-02-18 NOTE — Telephone Encounter (Signed)
Pt notified and states that he is going about every hour and half but if its starts getting worse then he will come in

## 2014-02-19 ENCOUNTER — Telehealth: Payer: Self-pay | Admitting: Family Medicine

## 2014-02-19 NOTE — Telephone Encounter (Signed)
Zachary Vargas a PT at [facility] is at the home with pt. PT had another on Tues9/8 in his tub. Pt say is sore and will not come in to see Dr Redmond School because says he can not afford to pay for taxi.[facility] worked to get pt in [facility] living but pt must sell home first. Juliann Pulse working with pt now on fall prevention tactics however he is not being very receptive right now.

## 2014-02-20 NOTE — Telephone Encounter (Signed)
[facility] HAS CALLED AND SAID HE HAS MORE FALLS AND HIS B/P 90 /60 AND WANTED TO KNOW IF HE SHOULD BE TAKING LISINOPRIL 10 MG TID AND THE hydrALAZINE (APRESOLINE) 50 MG tablet -  Take 50 mg by mouth 3 (three) times daily. SHE WANTS ME TO CALL HER BACK AND LET HER KNOW [phone]

## 2014-02-20 NOTE — Telephone Encounter (Signed)
TALKED TO [facility] LISINOPRIL 10MG  QD AND HOLD hydralazine 50 MG CHECK BLOOD PRESSURE NEXT WEEK AND LET us KNOW HOW IT IS

## 2014-02-20 NOTE — Telephone Encounter (Signed)
The lisinopril should be one pill per day. Cut back to one hydralazine per day and recheck blood pressure next week.

## 2014-02-25 ENCOUNTER — Telehealth: Payer: Self-pay

## 2014-02-25 NOTE — Telephone Encounter (Signed)
[facility] CALLED JUST FYI Klark HAS FALLEN AGAIN BUT HE IS NOT HURT

## 2014-02-26 ENCOUNTER — Telehealth: Payer: Self-pay

## 2014-02-26 NOTE — Telephone Encounter (Signed)
THE LADY FROM [facility] CALLED AND SAID THE P/T TOOK HIS B/P TODAY AND IT WAS 140/55 JUST FYI

## 2014-02-27 NOTE — Progress Notes (Signed)
This encounter was created in error - please disregard.

## 2014-03-04 ENCOUNTER — Ambulatory Visit: Payer: Medicare Other | Admitting: Family Medicine

## 2014-03-09 ENCOUNTER — Telehealth: Payer: Self-pay | Admitting: Family Medicine

## 2014-03-09 NOTE — Telephone Encounter (Signed)
Called Zachary Vargas left message it was okay

## 2014-03-09 NOTE — Telephone Encounter (Signed)
Set this up 

## 2014-03-16 ENCOUNTER — Ambulatory Visit: Payer: Medicare Other | Admitting: Family Medicine

## 2014-03-18 ENCOUNTER — Ambulatory Visit: Payer: Medicare Other | Admitting: Family Medicine

## 2014-04-20 ENCOUNTER — Other Ambulatory Visit: Payer: Self-pay

## 2014-04-20 ENCOUNTER — Telehealth: Payer: Self-pay | Admitting: Internal Medicine

## 2014-04-20 MED ORDER — LISINOPRIL 10 MG PO TABS: 30.0000 mg | ORAL_TABLET | Freq: Every day | ORAL | Status: DC

## 2014-04-20 MED ORDER — HYDRALAZINE HCL 25 MG PO TABS: 25.0000 mg | ORAL_TABLET | Freq: Three times a day (TID) | ORAL | Status: DC

## 2014-04-20 MED ORDER — LEVOTHYROXINE SODIUM 75 MCG PO TABS: 75.0000 ug | ORAL_TABLET | Freq: Every day | ORAL | Status: AC

## 2014-04-20 NOTE — Telephone Encounter (Signed)
Pt needs a refill on levothyroxine 75mg ,lisinopril 10mg , furosedime 40mg  and hydralazine 25mg  (in the computer it states 50mg  but patient states it should be 25mg )[facility]

## 2014-04-20 NOTE — Telephone Encounter (Signed)
Dr.Lalonde patient said his hydralazin is 25 mg tid instead of the 50mg  is it okay if I change it to this please advise

## 2014-04-20 NOTE — Telephone Encounter (Signed)
yes definitely cut this down

## 2014-04-24 ENCOUNTER — Telehealth: Payer: Self-pay

## 2014-04-24 ENCOUNTER — Other Ambulatory Visit: Payer: Self-pay

## 2014-04-24 NOTE — Telephone Encounter (Signed)
Talked to [facility] explained everything now im going to call Jwan and let him know to not take the hydralazine any more and to take only one 10 mg lisinopril a day call me on Monday and let me know what his B/P is through the weekend pt verbalized understanding

## 2014-04-24 NOTE — Telephone Encounter (Signed)
Zachary Vargas called and said that Zachary Vargas blood pressure was 80/48 that he had been taking the hydralazine 50 mg tid that is what he wanted when he put in for his refill but he continued to say it was 25 mg a day when he was wanting hydroxyzine 25 mg tid for itching but this has been discontinued so Zachary Vargas said she has put the hydralazine  To the side and told him not to take that for right now so is it okay to take the B/P med away let him continue on the lisinopril 10 mg tid and refill the one for itching please advise

## 2014-04-24 NOTE — Telephone Encounter (Signed)
Stop hydralazine completely and only lisinopril once per day

## 2014-05-01 ENCOUNTER — Telehealth: Payer: Self-pay | Admitting: Family Medicine

## 2014-05-01 NOTE — Telephone Encounter (Signed)
Pt called to give BP readings for the week to Cheri. They are:  11/14  118/67  11/15  122/70  11/16  126/77            118/65  11/17  117/60            118/63  11/19   116/72  11/20   116/68

## 2014-05-04 NOTE — Telephone Encounter (Signed)
Let him know that these readings look pretty good

## 2014-05-04 NOTE — Telephone Encounter (Signed)
Dr.Lalonde I wanted you to see this  

## 2014-05-04 NOTE — Telephone Encounter (Signed)
I called pt to inform him that Dr.Lalonde said B/P looks good and lets continue doing what he has been  Pt verbalized understanding

## 2014-05-06 ENCOUNTER — Other Ambulatory Visit: Payer: Self-pay

## 2014-05-06 ENCOUNTER — Telehealth: Payer: Self-pay | Admitting: Family Medicine

## 2014-05-06 MED ORDER — GLUCOSE BLOOD VI STRP: ORAL_STRIP | Status: DC

## 2014-05-06 NOTE — Telephone Encounter (Signed)
Called Zachary Vargas to find out what kind of test strips he needed he said one touch ultra pt test one time a day

## 2014-05-06 NOTE — Telephone Encounter (Signed)
Pt called and stated that he needs strips for his meter. He is ok for lancets so just the strips. [facility]

## 2014-05-06 NOTE — Telephone Encounter (Signed)
done

## 2014-05-12 ENCOUNTER — Telehealth: Payer: Self-pay | Admitting: Family Medicine

## 2014-05-12 DIAGNOSIS — G609 Hereditary and idiopathic neuropathy, unspecified: Secondary | ICD-10-CM

## 2014-05-12 MED ORDER — NORTRIPTYLINE HCL 50 MG PO CAPS: 50.0000 mg | ORAL_CAPSULE | Freq: Every day | ORAL | Status: AC

## 2014-05-12 NOTE — Telephone Encounter (Signed)
Nortriptyline renewed

## 2014-05-16 ENCOUNTER — Telehealth: Payer: Self-pay | Admitting: Family Medicine

## 2014-05-16 NOTE — Telephone Encounter (Signed)
Pts ins will not pay for One touch Ultra, preferred  are FreeStyle Lite and Precision Xtra,  Can you switch? And send in Rx for strips and meter

## 2014-05-17 NOTE — Telephone Encounter (Signed)
Take care of this 

## 2014-05-18 ENCOUNTER — Other Ambulatory Visit: Payer: Self-pay

## 2014-05-18 MED ORDER — FREESTYLE FREEDOM LITE W/DEVICE KIT: 1.0000 | PACK | Freq: Two times a day (BID) | Status: DC

## 2014-05-18 MED ORDER — GLUCOSE BLOOD VI STRP: ORAL_STRIP | Status: AC

## 2014-05-21 ENCOUNTER — Telehealth: Payer: Self-pay | Admitting: Family Medicine

## 2014-05-21 MED ORDER — PAROXETINE HCL 40 MG PO TABS: 40.0000 mg | ORAL_TABLET | ORAL | Status: DC

## 2014-05-21 NOTE — Telephone Encounter (Signed)
Paxil renewed 

## 2014-06-15 ENCOUNTER — Telehealth: Payer: Self-pay | Admitting: Internal Medicine

## 2014-06-15 ENCOUNTER — Other Ambulatory Visit: Payer: Self-pay

## 2014-06-15 MED ORDER — FREESTYLE FREEDOM LITE W/DEVICE KIT: 1.0000 | PACK | Freq: Two times a day (BID) | Status: AC

## 2014-06-15 NOTE — Telephone Encounter (Signed)
The freestyle lite kit was ordered on 05/18/14 i just reorder it again

## 2014-06-15 NOTE — Telephone Encounter (Signed)
Pt received freestyle test strips and that was not correct, so pt needs a refill on his one touch ultra 2 test strips. Pt tests once a day. [facility]

## 2014-06-15 NOTE — Telephone Encounter (Signed)
Called pt back and he states he needs freestyle meter not one touch ultra as his insurance won't cover it.

## 2014-06-22 ENCOUNTER — Encounter: Payer: Self-pay | Admitting: Family Medicine

## 2014-06-29 ENCOUNTER — Telehealth: Payer: Self-pay | Admitting: Internal Medicine

## 2014-06-29 ENCOUNTER — Other Ambulatory Visit: Payer: Self-pay

## 2014-06-29 MED ORDER — GLIPIZIDE 5 MG PO TABS: 5.0000 mg | ORAL_TABLET | Freq: Every day | ORAL | Status: DC

## 2014-06-29 NOTE — Telephone Encounter (Signed)
done

## 2014-06-29 NOTE — Telephone Encounter (Signed)
Sent in glipizde per pt request

## 2014-06-29 NOTE — Telephone Encounter (Signed)
Pt needs a refill on his glipizide 5mg  [facility]

## 2014-07-06 ENCOUNTER — Telehealth: Payer: Self-pay | Admitting: Internal Medicine

## 2014-07-06 NOTE — Telephone Encounter (Signed)
Pt sugar was 41 this morning then went up to 144 after eating 2 pb and j sandwichs then went down to 114  Per [facility]- pt sugar needs to be above 70 and if goes under then he needs to eat something to see if it will go up.  I advise pt of Dr. Redmond School recommendations and told pt that he needs to keep something to eat upstairs as he was too weak this morning and barely made it to the kitchen downstairs. Pt will call back if any other problems

## 2014-07-09 ENCOUNTER — Telehealth: Payer: Self-pay | Admitting: Family Medicine

## 2014-07-09 NOTE — Telephone Encounter (Signed)
Bernell List is a friend of our patients who called wanting to give [place] some of his concerns.  I advised we could not discuss anything with him regarding our patient.  He said he just wanted [place] to listen.  So I said ok.  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  He states the [place] could help him with an electric chair, or a ramp.   Franciscojavier has mentioned to him a couple of times on the phone that he has a Mason living with him.  So when Randal went to visit, he did not see a Racoon.  He states Augustino said the other morning when he woke up that the racoon was laying across his face and then jumped off and went out the door.   Randal thinks Dinari is drinking again and thinks one of his AA buddies is bringing it to him. Randal thinks Taggart is hallucinating.  Randal wanted to know how we could help Shayden and if we was going to get him in for an appointment and what should he do. I explained to Randal, we can't discuss any of that with him.  That he should continue to be a friend and a help to Nikolaos any way he could.  And that I would pass this information on to his doctor. Randal just called back again and said he had talked with the [facility] in the [place] office and advised Brayson selected to go the civilian route for his services. Do we need home health to go out?

## 2014-07-20 ENCOUNTER — Telehealth: Payer: Self-pay | Admitting: Internal Medicine

## 2014-07-20 MED ORDER — HYDROXYZINE HCL 25 MG PO TABS: 25.0000 mg | ORAL_TABLET | Freq: Three times a day (TID) | ORAL | Status: DC | PRN

## 2014-07-20 NOTE — Telephone Encounter (Signed)
Let him know that I called the medication in. 

## 2014-07-20 NOTE — Telephone Encounter (Signed)
Dr.lalonde is this okay 

## 2014-07-20 NOTE — Telephone Encounter (Signed)
Pt called back and said he is itching really bad and needs this med

## 2014-07-20 NOTE — Telephone Encounter (Signed)
Pt states his ezcema is acting up and he needs his hydralazine to [employer]

## 2014-07-20 NOTE — Telephone Encounter (Signed)
okay

## 2014-08-14 ENCOUNTER — Encounter: Payer: Self-pay | Admitting: Family Medicine

## 2014-08-14 ENCOUNTER — Ambulatory Visit (INDEPENDENT_AMBULATORY_CARE_PROVIDER_SITE_OTHER): Payer: Medicare Other | Admitting: Family Medicine

## 2014-08-14 VITALS — BP 110/72 | HR 73

## 2014-08-14 DIAGNOSIS — E039 Hypothyroidism, unspecified: Secondary | ICD-10-CM

## 2014-08-14 DIAGNOSIS — E1169 Type 2 diabetes mellitus with other specified complication: Secondary | ICD-10-CM

## 2014-08-14 DIAGNOSIS — I10 Essential (primary) hypertension: Secondary | ICD-10-CM | POA: Diagnosis not present

## 2014-08-14 DIAGNOSIS — E1121 Type 2 diabetes mellitus with diabetic nephropathy: Secondary | ICD-10-CM | POA: Diagnosis not present

## 2014-08-14 DIAGNOSIS — N289 Disorder of kidney and ureter, unspecified: Secondary | ICD-10-CM | POA: Diagnosis not present

## 2014-08-14 DIAGNOSIS — D649 Anemia, unspecified: Secondary | ICD-10-CM

## 2014-08-14 DIAGNOSIS — I152 Hypertension secondary to endocrine disorders: Secondary | ICD-10-CM

## 2014-08-14 DIAGNOSIS — Z23 Encounter for immunization: Secondary | ICD-10-CM

## 2014-08-14 DIAGNOSIS — E119 Type 2 diabetes mellitus without complications: Secondary | ICD-10-CM | POA: Diagnosis not present

## 2014-08-14 DIAGNOSIS — F32A Depression, unspecified: Secondary | ICD-10-CM

## 2014-08-14 DIAGNOSIS — E785 Hyperlipidemia, unspecified: Secondary | ICD-10-CM

## 2014-08-14 DIAGNOSIS — F329 Major depressive disorder, single episode, unspecified: Secondary | ICD-10-CM

## 2014-08-14 DIAGNOSIS — E1159 Type 2 diabetes mellitus with other circulatory complications: Secondary | ICD-10-CM

## 2014-08-14 LAB — CBC WITH DIFFERENTIAL/PLATELET
BASOS ABS: 0 10*3/uL (ref 0.0–0.1)
Basophils Relative: 0 % (ref 0–1)
EOS PCT: 7 % — AB (ref 0–5)
Eosinophils Absolute: 0.7 10*3/uL (ref 0.0–0.7)
HCT: 30 % — ABNORMAL LOW (ref 39.0–52.0)
HEMOGLOBIN: 9.8 g/dL — AB (ref 13.0–17.0)
LYMPHS ABS: 2.5 10*3/uL (ref 0.7–4.0)
Lymphocytes Relative: 26 % (ref 12–46)
MCH: 33.3 pg (ref 26.0–34.0)
MCHC: 32.7 g/dL (ref 30.0–36.0)
MCV: 102 fL — ABNORMAL HIGH (ref 78.0–100.0)
MPV: 9.9 fL (ref 8.6–12.4)
Monocytes Absolute: 1.3 10*3/uL — ABNORMAL HIGH (ref 0.1–1.0)
Monocytes Relative: 13 % — ABNORMAL HIGH (ref 3–12)
NEUTROS PCT: 54 % (ref 43–77)
Neutro Abs: 5.2 10*3/uL (ref 1.7–7.7)
PLATELETS: 239 10*3/uL (ref 150–400)
RBC: 2.94 MIL/uL — AB (ref 4.22–5.81)
RDW: 14.3 % (ref 11.5–15.5)
WBC: 9.7 10*3/uL (ref 4.0–10.5)

## 2014-08-14 LAB — COMPREHENSIVE METABOLIC PANEL
ALT: 9 U/L (ref 0–53)
AST: 12 U/L (ref 0–37)
Albumin: 3.4 g/dL — ABNORMAL LOW (ref 3.5–5.2)
Alkaline Phosphatase: 96 U/L (ref 39–117)
BILIRUBIN TOTAL: 0.2 mg/dL (ref 0.2–1.2)
BUN: 50 mg/dL — AB (ref 6–23)
CO2: 22 meq/L (ref 19–32)
CREATININE: 2.15 mg/dL — AB (ref 0.50–1.35)
Calcium: 8.8 mg/dL (ref 8.4–10.5)
Chloride: 106 mEq/L (ref 96–112)
GLUCOSE: 36 mg/dL — AB (ref 70–99)
POTASSIUM: 5.3 meq/L (ref 3.5–5.3)
Sodium: 139 mEq/L (ref 135–145)
TOTAL PROTEIN: 6.1 g/dL (ref 6.0–8.3)

## 2014-08-14 LAB — LIPID PANEL
CHOL/HDL RATIO: 2.2 ratio
Cholesterol: 169 mg/dL (ref 0–200)
HDL: 76 mg/dL (ref >=40)
LDL Cholesterol: 64 mg/dL (ref 0–99)
TRIGLYCERIDES: 143 mg/dL (ref <150)
VLDL: 29 mg/dL (ref 0–40)

## 2014-08-14 LAB — POCT GLYCOSYLATED HEMOGLOBIN (HGB A1C): HEMOGLOBIN A1C: 4.9

## 2014-08-14 NOTE — Patient Instructions (Signed)
Throw away the glipizide

## 2014-08-14 NOTE — Progress Notes (Signed)
  Subjective:    Patient ID: Zachary Vargas, male    DOB: 07/08/1927, 81 y.o.   MRN: [id number]  Zachary Vargas is a 81 y.o. male who presents for follow-up of Type 2 diabetes mellitus. He continues to live alone. His mobility is quite limited. He does have an underlying neuropathy. Now he is getting transportation from his son. He does feel quite lonely but is not yet ready to go into assisted living. He has cut back on his alcohol to one or 2 drinks per day. Presently he is not taking Protonix and is not having any GI symptoms. He continues on other medications listed in the chart and is having no difficulty with them. Laboratory data was also reviewed. Home blood sugar records: patient test one time a day Current symptoms/problems none Daily foot checks:   Any foot concerns: none Exercise: Standing (strengthen legs and walker) EYE:07/28/13 The following portions of the patient's history were reviewed and updated as appropriate: allergies, current medications, past medical history, past social history and problem list.  ROS as in subjective above.     Objective:    Physical Exam Alert and in no distress otherwise not examined.  Lab Review Diabetic Labs Latest Ref Rng 02/01/2014 01/31/2014 01/31/2014 12/01/2013 11/30/2013  HbA1c <5.7 % - - - - -  Chol 0 - 200 mg/dL - - - - -  HDL >39 mg/dL - - - - -  Calc LDL 0 - 99 mg/dL - - - - -  Triglycerides <150 mg/dL - - - - -  Creatinine 0.50 - 1.35 mg/dL 1.46(H) 2.10(H) 1.97(H) 1.23 1.27   BP/Weight 02/01/2014 01/31/2014 01/15/2014 01/06/2014 09/02/4008  Systolic BP 272 - 536 644 034  Diastolic BP 50 - 60 51 67  Wt. (Lbs) - 0 230 - 254.8  BMI - - 28.75 - 31.85   No flowsheet data found.  Eron  reports that he has quit smoking. He has never used smokeless tobacco. He reports that he drinks alcohol. He reports that he does not use illicit drugs. hemoglobin A1c is 4.9    Assessment & Plan:   Encounter Diagnoses  Name Primary?  . Diabetes  mellitus without complication Yes  . Flu vaccine need   . Type 2 diabetes mellitus with diabetic nephropathy   . Renal insufficiency   . Anemia, unspecified anemia type   . Need for prophylactic vaccination against Streptococcus pneumoniae (pneumococcus)   . Need for prophylactic vaccination and inoculation against influenza   . Hypothyroidism, unspecified hypothyroidism type   . Dyslipidemia associated with type 2 diabetes mellitus   . Hypertension complicating diabetes   . Depression     1. Rx changes: He is to stop his glipizide. He has our he stopped taking Protonix. 2. Education: Reviewed 'ABCs' of diabetes management (respective goals in parentheses):  A1C (<7), blood pressure (<130/80), and cholesterol (LDL <100). 3. Compliance at present is estimated to be fair. Efforts to improve compliance (if necessary) will be directed at Continue on other medications as indicated. 4. Follow up: 4 months   Overall he seems to be doing well. Again I suggested that he look into assisted living. 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 Routine blood screening will also be ordered.

## 2014-08-15 LAB — TSH: TSH: 7.17 u[IU]/mL — ABNORMAL HIGH (ref 0.350–4.500)

## 2014-08-18 ENCOUNTER — Ambulatory Visit (INDEPENDENT_AMBULATORY_CARE_PROVIDER_SITE_OTHER): Payer: Medicare Other | Admitting: Family Medicine

## 2014-08-18 ENCOUNTER — Encounter: Payer: Self-pay | Admitting: Family Medicine

## 2014-08-18 VITALS — BP 106/58 | HR 69 | Ht 75.0 in | Wt 225.0 lb

## 2014-08-18 DIAGNOSIS — R531 Weakness: Secondary | ICD-10-CM | POA: Diagnosis not present

## 2014-08-18 DIAGNOSIS — M1990 Unspecified osteoarthritis, unspecified site: Secondary | ICD-10-CM | POA: Diagnosis not present

## 2014-08-18 DIAGNOSIS — E1121 Type 2 diabetes mellitus with diabetic nephropathy: Secondary | ICD-10-CM

## 2014-08-18 DIAGNOSIS — M48061 Spinal stenosis, lumbar region without neurogenic claudication: Secondary | ICD-10-CM

## 2014-08-18 DIAGNOSIS — G629 Polyneuropathy, unspecified: Secondary | ICD-10-CM | POA: Diagnosis not present

## 2014-08-18 DIAGNOSIS — M4806 Spinal stenosis, lumbar region: Secondary | ICD-10-CM

## 2014-08-18 NOTE — Progress Notes (Signed)
[facility] for PT assesment for power wheel chair

## 2014-08-18 NOTE — Progress Notes (Signed)
   Subjective:    Patient ID: Zachary Vargas, male    DOB: 12-22-1927, 81 y.o.   MRN: [id number]  HPI  he is here for consultation concerning getting a power mobility device. He does have diabetes and does have a peripheral neuropathy. He also has right shoulder arthritis as well as left knee arthritis which interferes with his mobility. He has had his right shoulder injected on 2 occasions to help with this but it did not last very long. Does have trouble using the wheelchair ramp stating it is probably too steep. He does use his wheelchair to get to the bathroom I will the bathroom a small and when he is in the bathroom he needs to use a walker. He also uses his wheelchair to get to the kitchen but finds it difficult due to his arm pain. He only uses his walker well in the bathroom because it's too small for the wheelchair to get in. He does not use a cane. His upper extremity strength is questionable due to his right shoulder arthritis   Review of Systems     Objective:   Physical Exam  alert and in no distress otherwise not examined       Assessment & Plan:  Type 2 diabetes mellitus with diabetic nephropathy  Spinal stenosis of lumbar region  Peripheral neuropathy - Plan: Ambulatory referral to Physical Therapy  Generalized weakness - Plan: Ambulatory referral to Physical Therapy  Arthritis - Plan: Ambulatory referral to Physical Therapy  I will refer him to physical therapy to get a better idea of his motor strength. He certainly has difficulties with wheelchair use especially going up and down the ramp. His overall strength is definitely deteriorating.  once I get physical therapy assessment I will bill will fill out form. We'll also try to arrange to have the ramp evaluated in terms of code for ADA.

## 2014-08-26 ENCOUNTER — Other Ambulatory Visit: Payer: Self-pay | Admitting: Internal Medicine

## 2014-08-26 ENCOUNTER — Telehealth: Payer: Self-pay | Admitting: Internal Medicine

## 2014-08-26 MED ORDER — HYDROXYZINE HCL 25 MG PO TABS: 25.0000 mg | ORAL_TABLET | Freq: Three times a day (TID) | ORAL | Status: DC | PRN

## 2014-08-26 NOTE — Telephone Encounter (Signed)
Pt states that he still has not heard anything about doing PT for his powerwheel chair. Please call with a status update

## 2014-08-26 NOTE — Telephone Encounter (Signed)
Pt [facility] [phone]) and they told him they could not do the PT so he will have to go somewhere else.

## 2014-08-27 NOTE — Telephone Encounter (Signed)
Patient informed that neuro. P[facility] is at [facility] and they will call and make appointment

## 2014-08-27 NOTE — Addendum Note (Signed)
Addended by: Randel Books on: 08/27/2014 04:03 PM   Modules accepted: Orders

## 2014-09-10 ENCOUNTER — Telehealth: Payer: Self-pay | Admitting: Internal Medicine

## 2014-09-10 NOTE — Telephone Encounter (Signed)
okay

## 2014-09-10 NOTE — Telephone Encounter (Signed)
Pt states that he has a sore on his bottom and would like [facility] to come out and help him take care of it cause he can not reach it. He would like a guy name Bull (a nurse) to come out there. Is this okay to refer him there?

## 2014-09-11 NOTE — Telephone Encounter (Signed)
[facility]

## 2014-09-19 ENCOUNTER — Emergency Department (HOSPITAL_COMMUNITY): Payer: Medicare Other

## 2014-09-19 ENCOUNTER — Inpatient Hospital Stay (HOSPITAL_COMMUNITY)
Admission: EM | Admit: 2014-09-19 | Discharge: 2014-09-23 | DRG: 871 | Disposition: A | Discharge status: Home Health | Payer: Medicare Other | Attending: Internal Medicine | Admitting: Internal Medicine

## 2014-09-19 ENCOUNTER — Encounter (HOSPITAL_COMMUNITY): Payer: Self-pay | Admitting: Emergency Medicine

## 2014-09-19 DIAGNOSIS — R6521 Severe sepsis with septic shock: Secondary | ICD-10-CM | POA: Diagnosis present

## 2014-09-19 DIAGNOSIS — I10 Essential (primary) hypertension: Secondary | ICD-10-CM

## 2014-09-19 DIAGNOSIS — B964 Proteus (mirabilis) (morganii) as the cause of diseases classified elsewhere: Secondary | ICD-10-CM | POA: Diagnosis present

## 2014-09-19 DIAGNOSIS — N189 Chronic kidney disease, unspecified: Secondary | ICD-10-CM

## 2014-09-19 DIAGNOSIS — I129 Hypertensive chronic kidney disease with stage 1 through stage 4 chronic kidney disease, or unspecified chronic kidney disease: Secondary | ICD-10-CM | POA: Diagnosis present

## 2014-09-19 DIAGNOSIS — F32A Depression, unspecified: Secondary | ICD-10-CM

## 2014-09-19 DIAGNOSIS — A419 Sepsis, unspecified organism: Secondary | ICD-10-CM | POA: Diagnosis present

## 2014-09-19 DIAGNOSIS — Z993 Dependence on wheelchair: Secondary | ICD-10-CM | POA: Diagnosis not present

## 2014-09-19 DIAGNOSIS — G9340 Encephalopathy, unspecified: Secondary | ICD-10-CM

## 2014-09-19 DIAGNOSIS — E785 Hyperlipidemia, unspecified: Secondary | ICD-10-CM | POA: Diagnosis present

## 2014-09-19 DIAGNOSIS — Z801 Family history of malignant neoplasm of trachea, bronchus and lung: Secondary | ICD-10-CM | POA: Diagnosis not present

## 2014-09-19 DIAGNOSIS — R6520 Severe sepsis without septic shock: Secondary | ICD-10-CM | POA: Diagnosis not present

## 2014-09-19 DIAGNOSIS — Z66 Do not resuscitate: Secondary | ICD-10-CM | POA: Diagnosis present

## 2014-09-19 DIAGNOSIS — E039 Hypothyroidism, unspecified: Secondary | ICD-10-CM | POA: Diagnosis present

## 2014-09-19 DIAGNOSIS — Z87891 Personal history of nicotine dependence: Secondary | ICD-10-CM | POA: Diagnosis not present

## 2014-09-19 DIAGNOSIS — E1121 Type 2 diabetes mellitus with diabetic nephropathy: Secondary | ICD-10-CM | POA: Diagnosis present

## 2014-09-19 DIAGNOSIS — R5381 Other malaise: Secondary | ICD-10-CM

## 2014-09-19 DIAGNOSIS — N390 Urinary tract infection, site not specified: Secondary | ICD-10-CM | POA: Diagnosis present

## 2014-09-19 DIAGNOSIS — N179 Acute kidney failure, unspecified: Secondary | ICD-10-CM | POA: Diagnosis present

## 2014-09-19 DIAGNOSIS — M1A00X1 Idiopathic chronic gout, unspecified site, with tophus (tophi): Secondary | ICD-10-CM

## 2014-09-19 DIAGNOSIS — R531 Weakness: Secondary | ICD-10-CM

## 2014-09-19 DIAGNOSIS — E1159 Type 2 diabetes mellitus with other circulatory complications: Secondary | ICD-10-CM

## 2014-09-19 DIAGNOSIS — E1165 Type 2 diabetes mellitus with hyperglycemia: Secondary | ICD-10-CM | POA: Diagnosis present

## 2014-09-19 DIAGNOSIS — N183 Chronic kidney disease, stage 3 (moderate): Secondary | ICD-10-CM | POA: Diagnosis present

## 2014-09-19 DIAGNOSIS — M48061 Spinal stenosis, lumbar region without neurogenic claudication: Secondary | ICD-10-CM

## 2014-09-19 DIAGNOSIS — E872 Acidosis: Secondary | ICD-10-CM | POA: Diagnosis present

## 2014-09-19 DIAGNOSIS — R0603 Acute respiratory distress: Secondary | ICD-10-CM

## 2014-09-19 DIAGNOSIS — E1169 Type 2 diabetes mellitus with other specified complication: Secondary | ICD-10-CM

## 2014-09-19 DIAGNOSIS — IMO0002 Reserved for concepts with insufficient information to code with codable children: Secondary | ICD-10-CM

## 2014-09-19 DIAGNOSIS — N289 Disorder of kidney and ureter, unspecified: Secondary | ICD-10-CM

## 2014-09-19 DIAGNOSIS — F329 Major depressive disorder, single episode, unspecified: Secondary | ICD-10-CM | POA: Diagnosis present

## 2014-09-19 DIAGNOSIS — G629 Polyneuropathy, unspecified: Secondary | ICD-10-CM

## 2014-09-19 DIAGNOSIS — E119 Type 2 diabetes mellitus without complications: Secondary | ICD-10-CM

## 2014-09-19 DIAGNOSIS — M1990 Unspecified osteoarthritis, unspecified site: Secondary | ICD-10-CM

## 2014-09-19 DIAGNOSIS — R52 Pain, unspecified: Secondary | ICD-10-CM

## 2014-09-19 DIAGNOSIS — E1140 Type 2 diabetes mellitus with diabetic neuropathy, unspecified: Secondary | ICD-10-CM | POA: Diagnosis present

## 2014-09-19 DIAGNOSIS — D638 Anemia in other chronic diseases classified elsewhere: Secondary | ICD-10-CM | POA: Diagnosis present

## 2014-09-19 DIAGNOSIS — Z833 Family history of diabetes mellitus: Secondary | ICD-10-CM

## 2014-09-19 DIAGNOSIS — A4150 Gram-negative sepsis, unspecified: Secondary | ICD-10-CM | POA: Diagnosis not present

## 2014-09-19 DIAGNOSIS — R7881 Bacteremia: Secondary | ICD-10-CM

## 2014-09-19 DIAGNOSIS — K219 Gastro-esophageal reflux disease without esophagitis: Secondary | ICD-10-CM | POA: Diagnosis present

## 2014-09-19 DIAGNOSIS — I152 Hypertension secondary to endocrine disorders: Secondary | ICD-10-CM

## 2014-09-19 DIAGNOSIS — E038 Other specified hypothyroidism: Secondary | ICD-10-CM

## 2014-09-19 LAB — CBC WITH DIFFERENTIAL/PLATELET
BASOS ABS: 0 10*3/uL (ref 0.0–0.1)
BASOS PCT: 0 % (ref 0–1)
Eosinophils Absolute: 0.1 10*3/uL (ref 0.0–0.7)
Eosinophils Relative: 1 % (ref 0–5)
HCT: 29.7 % — ABNORMAL LOW (ref 39.0–52.0)
HEMOGLOBIN: 9.7 g/dL — AB (ref 13.0–17.0)
LYMPHS PCT: 9 % — AB (ref 12–46)
Lymphs Abs: 1.7 10*3/uL (ref 0.7–4.0)
MCH: 32.7 pg (ref 26.0–34.0)
MCHC: 32.7 g/dL (ref 30.0–36.0)
MCV: 100 fL (ref 78.0–100.0)
MONO ABS: 1.8 10*3/uL — AB (ref 0.1–1.0)
Monocytes Relative: 9 % (ref 3–12)
NEUTROS ABS: 15.8 10*3/uL — AB (ref 1.7–7.7)
Neutrophils Relative %: 81 % — ABNORMAL HIGH (ref 43–77)
PLATELETS: 157 10*3/uL (ref 150–400)
RBC: 2.97 MIL/uL — ABNORMAL LOW (ref 4.22–5.81)
RDW: 13.2 % (ref 11.5–15.5)
WBC: 19.4 10*3/uL — AB (ref 4.0–10.5)

## 2014-09-19 LAB — URINALYSIS, ROUTINE W REFLEX MICROSCOPIC
Glucose, UA: NEGATIVE mg/dL
Ketones, ur: NEGATIVE mg/dL
Nitrite: NEGATIVE
PH: 8 (ref 5.0–8.0)
Protein, ur: 100 mg/dL — AB
SPECIFIC GRAVITY, URINE: 1.015 (ref 1.005–1.030)
UROBILINOGEN UA: 0.2 mg/dL (ref 0.0–1.0)

## 2014-09-19 LAB — COMPREHENSIVE METABOLIC PANEL
ALT: 14 U/L (ref 0–53)
ANION GAP: 9 (ref 5–15)
AST: 14 U/L (ref 0–37)
Albumin: 2.9 g/dL — ABNORMAL LOW (ref 3.5–5.2)
Alkaline Phosphatase: 82 U/L (ref 39–117)
BUN: 67 mg/dL — ABNORMAL HIGH (ref 6–23)
CO2: 20 mmol/L (ref 19–32)
CREATININE: 3.67 mg/dL — AB (ref 0.50–1.35)
Calcium: 8.8 mg/dL (ref 8.4–10.5)
Chloride: 103 mmol/L (ref 96–112)
GFR calc Af Amer: 16 mL/min — ABNORMAL LOW (ref >90)
GFR calc non Af Amer: 14 mL/min — ABNORMAL LOW (ref >90)
GLUCOSE: 111 mg/dL — AB (ref 70–99)
Potassium: 5 mmol/L (ref 3.5–5.1)
Sodium: 132 mmol/L — ABNORMAL LOW (ref 135–145)
TOTAL PROTEIN: 5.3 g/dL — AB (ref 6.0–8.3)
Total Bilirubin: 0.6 mg/dL (ref 0.3–1.2)

## 2014-09-19 LAB — I-STAT CG4 LACTIC ACID, ED: LACTIC ACID, VENOUS: 1.67 mmol/L (ref 0.5–2.0)

## 2014-09-19 LAB — CK: Total CK: 36 U/L (ref 7–232)

## 2014-09-19 LAB — MRSA PCR SCREENING: MRSA by PCR: POSITIVE — AB

## 2014-09-19 LAB — URINE MICROSCOPIC-ADD ON

## 2014-09-19 LAB — GLUCOSE, CAPILLARY
GLUCOSE-CAPILLARY: 73 mg/dL (ref 70–99)
Glucose-Capillary: 80 mg/dL (ref 70–99)
Glucose-Capillary: 103 mg/dL — ABNORMAL HIGH (ref 70–99)
Glucose-Capillary: 135 mg/dL — ABNORMAL HIGH (ref 70–99)
Glucose-Capillary: 139 mg/dL — ABNORMAL HIGH (ref 70–99)

## 2014-09-19 LAB — TROPONIN I: Troponin I: <0.03 ng/mL (ref <0.031)

## 2014-09-19 MED ORDER — DEXTROSE 5 % IV SOLN: 1.0000 g | INTRAVENOUS | Status: DC

## 2014-09-19 MED ORDER — SODIUM CHLORIDE 0.9 % IV SOLN
INTRAVENOUS | Status: DC
  Administered 2014-09-19 (×2): via INTRAVENOUS

## 2014-09-19 MED ORDER — PAROXETINE HCL 20 MG PO TABS
40.0000 mg | ORAL_TABLET | Freq: Every day | ORAL | Status: DC
  Administered 2014-09-20 – 2014-09-23 (×4): 40 mg via ORAL
  Filled 2014-09-19 (×4): qty 2

## 2014-09-19 MED ORDER — SODIUM CHLORIDE 0.9 % IV BOLUS (SEPSIS)
1000.0000 mL | Freq: Once | INTRAVENOUS | Status: AC
  Administered 2014-09-19: 1000 mL via INTRAVENOUS

## 2014-09-19 MED ORDER — CEFTRIAXONE SODIUM IN DEXTROSE 20 MG/ML IV SOLN
1.0000 g | INTRAVENOUS | Status: DC
  Filled 2014-09-19: qty 50

## 2014-09-19 MED ORDER — DORZOLAMIDE HCL-TIMOLOL MAL 2-0.5 % OP SOLN
1.0000 [drp] | Freq: Two times a day (BID) | OPHTHALMIC | Status: DC
  Administered 2014-09-19 – 2014-09-23 (×8): 1 [drp] via OPHTHALMIC
  Filled 2014-09-19: qty 10

## 2014-09-19 MED ORDER — SODIUM CHLORIDE 0.9 % IV BOLUS (SEPSIS)
500.0000 mL | Freq: Once | INTRAVENOUS | Status: AC
  Administered 2014-09-19: 500 mL via INTRAVENOUS

## 2014-09-19 MED ORDER — HEPARIN SODIUM (PORCINE) 5000 UNIT/ML IJ SOLN: 5000.0000 [IU] | Freq: Three times a day (TID) | INTRAMUSCULAR | Status: DC

## 2014-09-19 MED ORDER — LEVOTHYROXINE SODIUM 75 MCG PO TABS
75.0000 ug | ORAL_TABLET | Freq: Every day | ORAL | Status: DC
  Administered 2014-09-20 – 2014-09-23 (×4): 75 ug via ORAL
  Filled 2014-09-19 (×5): qty 1

## 2014-09-19 MED ORDER — HYDROCORTISONE NA SUCCINATE PF 100 MG IJ SOLR
50.0000 mg | Freq: Four times a day (QID) | INTRAMUSCULAR | Status: DC
  Administered 2014-09-19 – 2014-09-21 (×8): 50 mg via INTRAVENOUS
  Filled 2014-09-19 (×4): qty 1
  Filled 2014-09-19: qty 2
  Filled 2014-09-19 (×2): qty 1
  Filled 2014-09-19: qty 2
  Filled 2014-09-19: qty 1
  Filled 2014-09-19: qty 2
  Filled 2014-09-19: qty 1
  Filled 2014-09-19: qty 2

## 2014-09-19 MED ORDER — SODIUM CHLORIDE 0.9 % IV BOLUS (SEPSIS)
2000.0000 mL | Freq: Once | INTRAVENOUS | Status: AC
  Administered 2014-09-19: 2000 mL via INTRAVENOUS

## 2014-09-19 MED ORDER — PAROXETINE HCL 20 MG PO TABS: 40.0000 mg | ORAL_TABLET | ORAL | Status: DC

## 2014-09-19 MED ORDER — PANTOPRAZOLE SODIUM 40 MG PO TBEC
40.0000 mg | DELAYED_RELEASE_TABLET | Freq: Every day | ORAL | Status: DC
  Administered 2014-09-19 – 2014-09-23 (×5): 40 mg via ORAL
  Filled 2014-09-19 (×5): qty 1

## 2014-09-19 MED ORDER — LATANOPROST 0.005 % OP SOLN
1.0000 [drp] | Freq: Every day | OPHTHALMIC | Status: DC
  Administered 2014-09-19 – 2014-09-22 (×4): 1 [drp] via OPHTHALMIC
  Filled 2014-09-19 (×2): qty 2.5

## 2014-09-19 MED ORDER — INSULIN ASPART 100 UNIT/ML ~~LOC~~ SOLN: 0.0000 [IU] | Freq: Every day | SUBCUTANEOUS | Status: DC

## 2014-09-19 MED ORDER — HEPARIN SODIUM (PORCINE) 5000 UNIT/ML IJ SOLN
5000.0000 [IU] | Freq: Three times a day (TID) | INTRAMUSCULAR | Status: DC
  Administered 2014-09-19 – 2014-09-21 (×6): 5000 [IU] via SUBCUTANEOUS
  Filled 2014-09-19 (×9): qty 1

## 2014-09-19 MED ORDER — NOREPINEPHRINE BITARTRATE 1 MG/ML IV SOLN
0.0000 ug/min | Freq: Once | INTRAVENOUS | Status: AC
  Administered 2014-09-19: 2 ug/min via INTRAVENOUS
  Filled 2014-09-19: qty 4

## 2014-09-19 MED ORDER — INSULIN ASPART 100 UNIT/ML ~~LOC~~ SOLN
0.0000 [IU] | Freq: Three times a day (TID) | SUBCUTANEOUS | Status: DC
  Administered 2014-09-19: 2 [IU] via SUBCUTANEOUS
  Administered 2014-09-20 (×3): 3 [IU] via SUBCUTANEOUS
  Administered 2014-09-21 (×2): 2 [IU] via SUBCUTANEOUS

## 2014-09-19 MED ORDER — NOREPINEPHRINE BITARTRATE 1 MG/ML IV SOLN
0.0000 ug/min | Freq: Once | INTRAVENOUS | Status: AC
  Administered 2014-09-19: 4 ug/min via INTRAVENOUS
  Filled 2014-09-19: qty 4

## 2014-09-19 MED ORDER — ACETAMINOPHEN 325 MG PO TABS
650.0000 mg | ORAL_TABLET | Freq: Four times a day (QID) | ORAL | Status: DC | PRN
  Administered 2014-09-20 – 2014-09-22 (×2): 650 mg via ORAL
  Filled 2014-09-19 (×2): qty 2

## 2014-09-19 MED ORDER — HYDROCORTISONE NA SUCCINATE PF 100 MG IJ SOLR
50.0000 mg | Freq: Four times a day (QID) | INTRAMUSCULAR | Status: DC
  Administered 2014-09-19: 50 mg via INTRAVENOUS
  Filled 2014-09-19: qty 2

## 2014-09-19 MED ORDER — DEXTROSE 5 % IV SOLN
1.0000 g | Freq: Once | INTRAVENOUS | Status: AC
  Administered 2014-09-19: 1 g via INTRAVENOUS
  Filled 2014-09-19: qty 10

## 2014-09-19 NOTE — ED Notes (Signed)
Pt is from home, pt resides by himself, reported that pt fell off of the toilet when trying to get back into his wheelchair, pt was able to call for help as he had a phone in his pocket. Pt is A&O. EMS reports pt is hypotensive (78/40)

## 2014-09-19 NOTE — ED Provider Notes (Signed)
CSN: [id number]     Arrival date & time 09/19/14  0430 History   First MD Initiated Contact with Patient 09/19/14 0444     Chief Complaint  Patient presents with  . Fall     (Consider location/radiation/quality/duration/timing/severity/associated sxs/prior Treatment) HPI Patient lives alone. Brought in by EMS after falling from toilet. No head or neck injury. States he's been feeling generally weak. No recent changes in his medications. Denies syncope. No chest pain or shortness of breath. No abdominal pain. No nausea, vomiting or diarrhea. Past Medical History  Diagnosis Date  . Glaucoma     uses Eye Drops daily  . Dyslipidemia     statin intolerant  . Gout     takes Colchicine and Uloric daily  . BPH (benign prostatic hyperplasia)     [facility]  . History of peptic ulcer disease   . Peptic ulcer disease   . Diverticulosis   . Lumbar spinal stenosis     Most recent MRI 12/ 2011; [facility] - Dr. Nelva Bush  . Carotid atherosclerosis     Dopplers 11/09 showing 50-69% stenosis  . Peripheral arterial disease     [place] [facility] care consult 1/08  . Epigastric pain     Hospitalization 7/11 [place]  . Arthritis     Followed by [facility]  . Myeloma     IgA related; Dr. Marin Olp - 2009  . Headache(784.0)     rarely  . Constipation     but doesn't take any meds  . Urinary frequency   . Anxiety     takes Paxil daily  . Insomnia     takes Ambien nightly   . Anemia     of chronic disease;takes Ferrous Gluconate daily  . Osteomyelitis   . Hypertension     takes Lisinopril daily  . Peripheral edema     takes Furosemide daily  . Pneumonia     hx of-about 76yr ago  . Peripheral neuropathy   . Joint pain   . Joint swelling   . Chronic back pain     spinal stenosis  . Chronic kidney disease     not on dialysis  . Hypothyroidism     takes Synthroid daily  . GERD (gastroesophageal reflux disease)     takes Protonix daily  . Depression   . Gout   . Diabetes  mellitus     takes glipizide daily, Type 2  . Cataract   . Substance abuse     alcohol   Past Surgical History  Procedure Laterality Date  . Abdominal aortic aneurysm repair  03/1998  . Abdominal hernia repair  2000  . Renal stone surgery  1999  . Nerve root block      Multiple for lumbar spine  . Colonoscopy  07/22/2004    [facility] 2011[facility]  . Eye surgery      bil cataract surgery  . Amputation Left 11/29/2012    Procedure: Left Foot, Great Toe Amputation at MTP Joint;  Surgeon: MNewt Minion MD;  [facility]  Service: Orthopedics;  Laterality: Left;  LEFT  Foot, Great Toe Amputation at MTP Joint  . Amputation Left 01/03/2013    Procedure: Left foot 2nd toe amputation at metatarsophalangeal;  Surgeon: MNewt Minion MD;  [place] [facility]  Service: Orthopedics;  Laterality: Left;  . Tonsillectomy    . Vasectomy    . Amputation Left 03/19/2013    Procedure: AMPUTATION DIGIT;  Surgeon: Newt Minion, MD;  Location: [place];  Service: Orthopedics;  Laterality: Left;  Left foot Amputation 3, 4, 5th Toes at MTP joint  . Esophagogastroduodenoscopy    . Total knee arthroplasty Right 10/15/2013    Procedure: TOTAL KNEE ARTHROPLASTY;  Surgeon: Newt Minion, MD;  Location: [place];  Service: Orthopedics;  Laterality: Right;  Right Total Knee Arthroplasty  . Hernia repair N/A 2000    abdominal  . Colonoscopy N/A 12/01/2013    Procedure: COLONOSCOPY;  Surgeon: Jeryl Columbia, MD;  Location: [facility];  Service: Endoscopy;  Laterality: N/A;  . Esophagogastroduodenoscopy N/A 12/01/2013    Procedure: ESOPHAGOGASTRODUODENOSCOPY (EGD);  Surgeon: Jeryl Columbia, MD;  Location: [facility];  Service: Endoscopy;  Laterality: N/A;  . Esophagogastroduodenoscopy  12/01/2013   Family History  Problem Relation Age of Onset  . Diabetes Father   . Glaucoma Father   . Cancer Brother     1 died of lung cancer, 1 of colon, 1 of different cancer  . Glaucoma Daughter   .  Glaucoma Son    History  Substance Use Topics  . Smoking status: Former Research scientist (life sciences)  . Smokeless tobacco: Never Used     Comment: quit smoking 25yr ago  . Alcohol Use: Yes     Comment: 4 oz drink daily    Review of Systems  Constitutional: Positive for fatigue. Negative for fever and chills.  Respiratory: Negative for shortness of breath.   Cardiovascular: Negative for chest pain.  Gastrointestinal: Negative for nausea, vomiting, abdominal pain, diarrhea and constipation.  Musculoskeletal: Negative for back pain, neck pain and neck stiffness.  Skin: Negative for rash and wound.  Neurological: Positive for weakness (generalized). Negative for dizziness, light-headedness, numbness and headaches.  All other systems reviewed and are negative.     Allergies  Morphine and related; Statins; and Allopurinol  Home Medications   Prior to Admission medications   Medication Sig Start Date End Date Taking? Authorizing Provider  cholecalciferol (VITAMIN D) 1000 UNITS tablet Take 1,000 Units by mouth daily.   Yes Historical Provider, MD  colchicine 0.6 MG tablet Take 0.6 mg by mouth daily.   Yes Historical Provider, MD  dorzolamide-timolol (COSOPT) 22.3-6.8 MG/ML ophthalmic solution Place 1 drop into both eyes 2 (two) times daily.    Yes Historical Provider, MD  febuxostat (ULORIC) 40 MG tablet Take 40 mg by mouth daily.   Yes Historical Provider, MD  ferrous gluconate (FERGON) 325 MG tablet Take 325 mg by mouth daily with breakfast.     Yes Historical Provider, MD  hydrOXYzine (ATARAX/VISTARIL) 25 MG tablet Take 1 tablet (25 mg total) by mouth 3 (three) times daily as needed. 08/26/14  Yes JDenita Lung MD  latanoprost (XALATAN) 0.005 % ophthalmic solution Place 1 drop into the right eye at bedtime.     Yes Historical Provider, MD  levothyroxine (SYNTHROID, LEVOTHROID) 75 MCG tablet Take 1 tablet (75 mcg total) by mouth daily before breakfast. 04/20/14  Yes JDenita Lung MD  lisinopril  (PRINIVIL,ZESTRIL) 10 MG tablet Take 3 tablets (30 mg total) by mouth daily. Patient taking differently: Take 10 mg by mouth daily. Patient is to take one tablet 04/20/14  Yes JDenita Lung MD  loratadine (CLARITIN) 10 MG tablet Take 10 mg by mouth daily.   Yes Historical Provider, MD  Multiple Vitamins-Minerals (MULTIVITAMIN WITH MINERALS) tablet Take 1 tablet by mouth daily.   Yes Historical Provider, MD  nortriptyline (PAMELOR) 50 MG capsule Take 1 capsule (  50 mg total) by mouth at bedtime. 05/12/14  Yes Denita Lung, MD  Omega-3 Fatty Acids (FISH OIL) 1000 MG CAPS Take 1,000 mg by mouth daily.   Yes Historical Provider, MD  pantoprazole (PROTONIX) 20 MG tablet Take 1 tablet (20 mg total) by mouth 2 (two) times daily before a meal. 12/01/13  Yes Costin Karlyne Greenspan, MD  PARoxetine (PAXIL) 40 MG tablet Take 1 tablet (40 mg total) by mouth every morning. 05/21/14  Yes Denita Lung, MD  polyethylene glycol [facility] / GLYCOLAX) packet Take 17 g by mouth 2 (two) times daily. 02/01/14  Yes Venetia Maxon Rama, MD  acetaminophen (TYLENOL) 500 MG tablet Take 1 tablet (500 mg total) by mouth every 6 (six) hours as needed for mild pain. Patient not taking: Reported on 08/18/2014 10/17/13   Newt Minion, MD  Blood Glucose Monitoring Suppl (FREESTYLE FREEDOM LITE) W/DEVICE KIT 1 each by Does not apply route 2 (two) times daily. Patient test bid DX:E11.9 06/15/14   Denita Lung, MD  glucose blood (FREESTYLE LITE) test strip Patient is to test bid DX: E11.9Use as instructed 05/18/14   Denita Lung, MD   BP 148/61 mmHg  Pulse 82  Temp(Src) 97.8 F (36.6 C) (Oral)  Resp 15  SpO2 100% Physical Exam  Constitutional: He is oriented to person, place, and time. He appears well-developed and well-nourished. No distress.  HENT:  Head: Normocephalic and atraumatic.  Mouth/Throat: Oropharynx is clear and moist.  Eyes: EOM are normal. Pupils are equal, round, and reactive to light.  Neck: Normal range of motion. Neck  supple.  No posterior midline cervical tenderness to palpation.  Cardiovascular: Normal rate and regular rhythm.   Pulmonary/Chest: Effort normal and breath sounds normal. No respiratory distress. He has no wheezes. He has no rales. He exhibits no tenderness.  Abdominal: Soft. Bowel sounds are normal. He exhibits no distension and no mass. There is no tenderness. There is no rebound and no guarding.  Musculoskeletal: Normal range of motion. He exhibits no edema or tenderness.  Neurological: He is alert and oriented to person, place, and time.  Drowsy but easily aroused. 5/5 motor in all extremities. Sensation is intact.  Skin: Skin is warm and dry. No rash noted. No erythema.  Psychiatric: He has a normal mood and affect. His behavior is normal.  Nursing note and vitals reviewed.   ED Course  Procedures (including critical care time) Labs Review Labs Reviewed  MRSA PCR SCREENING - Abnormal; Notable for the following:    MRSA by PCR POSITIVE (*)    All other components within normal limits  CBC WITH DIFFERENTIAL/PLATELET - Abnormal; Notable for the following:    WBC 19.4 (*)    RBC 2.97 (*)    Hemoglobin 9.7 (*)    HCT 29.7 (*)    Neutrophils Relative % 81 (*)    Neutro Abs 15.8 (*)    Lymphocytes Relative 9 (*)    Monocytes Absolute 1.8 (*)    All other components within normal limits  COMPREHENSIVE METABOLIC PANEL - Abnormal; Notable for the following:    Sodium 132 (*)    Glucose, Bld 111 (*)    BUN 67 (*)    Creatinine, Ser 3.67 (*)    Total Protein 5.3 (*)    Albumin 2.9 (*)    GFR calc non Af [name] 14 (*)    GFR calc Af [name] 16 (*)    All other components within normal limits  URINALYSIS,  ROUTINE W REFLEX MICROSCOPIC - Abnormal; Notable for the following:    Color, Urine AMBER (*)    APPearance CLOUDY (*)    Hgb urine dipstick MODERATE (*)    Bilirubin Urine SMALL (*)    Protein, ur 100 (*)    Leukocytes, UA LARGE (*)    All other components within normal limits   GLUCOSE, CAPILLARY - Abnormal; Notable for the following:    Glucose-Capillary 103 (*)    All other components within normal limits  GLUCOSE, CAPILLARY - Abnormal; Notable for the following:    Glucose-Capillary 135 (*)    All other components within normal limits  GLUCOSE, CAPILLARY - Abnormal; Notable for the following:    Glucose-Capillary 139 (*)    All other components within normal limits  CULTURE, BLOOD (ROUTINE X 2)  CULTURE, BLOOD (ROUTINE X 2)  TROPONIN I  CK  URINE MICROSCOPIC-ADD ON  GLUCOSE, CAPILLARY  GLUCOSE, CAPILLARY  CORTISOL  CBC  BASIC METABOLIC PANEL  I-STAT CG4 LACTIC ACID, ED    Imaging Review Ct Head Wo Contrast  09/19/2014   CLINICAL DATA:  Patient fell this morning. Weakness and hypotension.  EXAM: CT HEAD WITHOUT CONTRAST  TECHNIQUE: Contiguous axial images were obtained from the base of the skull through the vertex without intravenous contrast.  COMPARISON:  11/08/2013  FINDINGS: Diffuse cerebral atrophy. Mild ventricular dilatation consistent with central atrophy. Low-attenuation changes in the deep white matter consistent small vessel ischemia. No mass effect or midline shift. No abnormal extra-axial fluid collections. Gray-white matter junctions are distinct. Basal cisterns are not effaced. No evidence of acute intracranial hemorrhage. No depressed skull fractures. Opacification of the left sphenoid sinus with mucosal thickening in the ethmoid air cells. Mastoid air cells are not opacified. Vascular calcifications.  IMPRESSION: No acute intracranial abnormalities. Chronic atrophy and small vessel ischemic changes. New opacification of the left sphenoid sinus.   Electronically Signed   By: Lucienne Capers M.D.   On: 09/19/2014 06:56   Dg Chest Port 1 View  09/19/2014   CLINICAL DATA:  Patient fell off of toilet.  Weakness.  Hypotension.  EXAM: PORTABLE CHEST - 1 VIEW  COMPARISON:  CT chest 11/08/2013.  Chest 11/08/2013.  FINDINGS: Shallow inspiration with  elevation of the left hemidiaphragm. Cardiac enlargement. Diffuse interstitial pattern throughout the lungs consistent with fibrosis. No change since prior study. No focal consolidation. No blunting of costophrenic angles. No pneumothorax. Calcification of the aorta. Degenerative changes in the spine and shoulders. Old appearing right rib fracture.  IMPRESSION: Chronic interstitial fibrosis. Cardiac enlargement. No evidence of active infiltration.   Electronically Signed   By: Lucienne Capers M.D.   On: 09/19/2014 05:17     EKG Interpretation   Date/Time:  Saturday September 19 2014 06:57:45 EDT Ventricular Rate:  75 PR Interval:  217 QRS Duration: 126 QT Interval:  405 QTC Calculation: 452 R Axis:   76 Text Interpretation:  Sinus or ectopic atrial rhythm Borderline prolonged  PR interval Nonspecific intraventricular conduction delay Confirmed by  Lita Mains  MD, Casondra Gasca ([id number]) on 09/19/2014 7:03:08 AM     CRITICAL CARE Performed by: Lita Mains, Wolf Boulay Total critical care time: 40 min Critical care time was exclusive of separately billable procedures and treating other patients. Critical care was necessary to treat or prevent imminent or life-threatening deterioration. Critical care was time spent personally by me on the following activities: development of treatment plan with patient and/or surrogate as well as nursing, discussions with consultants, evaluation of patient's response to  treatment, examination of patient, obtaining history from patient or surrogate, ordering and performing treatments and interventions, ordering and review of laboratory studies, ordering and review of radiographic studies, pulse oximetry and re-evaluation of patient's condition.  MDM   Final diagnoses:  Weakness      Discussed with Dr Jimmy Footman. Recommends 2 more liters of IV fluid and reassess.   Pt required Levophed for BP support. Unable to wean. Discussed again with Critical care who will see pt in the ED.  Signed out to oncoming EP pending disposition by Critical Care.   Julianne Rice, MD 09/19/14 [phone]

## 2014-09-19 NOTE — ED Notes (Signed)
MD at bedside. 

## 2014-09-19 NOTE — ED Notes (Signed)
EMS reports the pt complained of neck pain upon arrival of EMS.

## 2014-09-19 NOTE — H&P (Signed)
PULMONARY / CRITICAL CARE MEDICINE   Name: Zachary Vargas MRN: [id number] DOB: [birth year]    ADMISSION DATE:  09/19/2014  REFERRING MD :  ER  CHIEF COMPLAINT:  Low BP  INITIAL PRESENTATION:  81 yo male from home after falling off toilet.  He had low BP in ER and PCCM asked to assess for sepsis.  STUDIES:  4/09 CT head >> chronic atrophy  SIGNIFICANT EVENTS: 4/09 Admit   HISTORY OF PRESENT ILLNESS:   81 yo male fell at home while trying to go to bathroom.  He had trouble getting up, and called 911.  In ER he was found to have u/a suggestive of UTI and hypotensive.  He denies chest pain, dizziness, dyspnea, abdominal pain, nausea, vomiting, diarrhea, fever.  He has not been able to walk since he had knee surgery.  He lives alone and does not have any other family in the area.  PAST MEDICAL HISTORY :   has a past medical history of Glaucoma; Dyslipidemia; Gout; BPH (benign prostatic hyperplasia); History of peptic ulcer disease; Peptic ulcer disease; Diverticulosis; Lumbar spinal stenosis; Carotid atherosclerosis; Peripheral arterial disease; Epigastric pain; Arthritis; Myeloma; Headache(784.0); Constipation; Urinary frequency; Anxiety; Insomnia; Anemia; Osteomyelitis; Hypertension; Peripheral edema; Pneumonia; Peripheral neuropathy; Joint pain; Joint swelling; Chronic back pain; Chronic kidney disease; Hypothyroidism; GERD (gastroesophageal reflux disease); Depression; Gout; Diabetes mellitus; Cataract; and Substance abuse.  has past surgical history that includes Abdominal aortic aneurysm repair (03/1998); Abdominal hernia repair (2000); renal stone surgery (1999); nerve root block; Colonoscopy (07/22/2004); Eye surgery; Amputation (Left, 11/29/2012); Amputation (Left, 01/03/2013); Tonsillectomy; Vasectomy; Amputation (Left, 03/19/2013); Esophagogastroduodenoscopy; Total knee arthroplasty (Right, 10/15/2013); Hernia repair (N/A, 2000); Colonoscopy (N/A, 12/01/2013); Esophagogastroduodenoscopy  (N/A, 12/01/2013); and Esophagogastroduodenoscopy (12/01/2013). Prior to Admission medications   Medication Sig Start Date End Date Taking? Authorizing Provider  cholecalciferol (VITAMIN D) 1000 UNITS tablet Take 1,000 Units by mouth daily.   Yes Historical Provider, MD  colchicine 0.6 MG tablet Take 0.6 mg by mouth daily.   Yes Historical Provider, MD  dorzolamide-timolol (COSOPT) 22.3-6.8 MG/ML ophthalmic solution Place 1 drop into both eyes 2 (two) times daily.    Yes Historical Provider, MD  febuxostat (ULORIC) 40 MG tablet Take 40 mg by mouth daily.   Yes Historical Provider, MD  ferrous gluconate (FERGON) 325 MG tablet Take 325 mg by mouth daily with breakfast.     Yes Historical Provider, MD  hydrOXYzine (ATARAX/VISTARIL) 25 MG tablet Take 1 tablet (25 mg total) by mouth 3 (three) times daily as needed. 08/26/14  Yes Denita Lung, MD  latanoprost (XALATAN) 0.005 % ophthalmic solution Place 1 drop into the right eye at bedtime.     Yes Historical Provider, MD  levothyroxine (SYNTHROID, LEVOTHROID) 75 MCG tablet Take 1 tablet (75 mcg total) by mouth daily before breakfast. 04/20/14  Yes Denita Lung, MD  lisinopril (PRINIVIL,ZESTRIL) 10 MG tablet Take 3 tablets (30 mg total) by mouth daily. Patient taking differently: Take 10 mg by mouth daily. Patient is to take one tablet 04/20/14  Yes Denita Lung, MD  loratadine (CLARITIN) 10 MG tablet Take 10 mg by mouth daily.   Yes Historical Provider, MD  Multiple Vitamins-Minerals (MULTIVITAMIN WITH MINERALS) tablet Take 1 tablet by mouth daily.   Yes Historical Provider, MD  nortriptyline (PAMELOR) 50 MG capsule Take 1 capsule (50 mg total) by mouth at bedtime. 05/12/14  Yes Denita Lung, MD  Omega-3 Fatty Acids (FISH OIL) 1000 MG CAPS Take 1,000 mg by mouth daily.  Yes Historical Provider, MD  pantoprazole (PROTONIX) 20 MG tablet Take 1 tablet (20 mg total) by mouth 2 (two) times daily before a meal. 12/01/13  Yes Costin Karlyne Greenspan, MD  PARoxetine  (PAXIL) 40 MG tablet Take 1 tablet (40 mg total) by mouth every morning. 05/21/14  Yes Denita Lung, MD  polyethylene glycol [facility] / GLYCOLAX) packet Take 17 g by mouth 2 (two) times daily. 02/01/14  Yes Venetia Maxon Rama, MD  acetaminophen (TYLENOL) 500 MG tablet Take 1 tablet (500 mg total) by mouth every 6 (six) hours as needed for mild pain. Patient not taking: Reported on 08/18/2014 10/17/13   Newt Minion, MD  Blood Glucose Monitoring Suppl (FREESTYLE FREEDOM LITE) W/DEVICE KIT 1 each by Does not apply route 2 (two) times daily. Patient test bid DX:E11.9 06/15/14   Denita Lung, MD  glucose blood (FREESTYLE LITE) test strip Patient is to test bid DX: E11.9Use as instructed 05/18/14   Denita Lung, MD   Allergies  Allergen Reactions  . Morphine And Related Hives and Shortness Of Breath  . Statins Hives    Pt notes that "all" classes of cholesterol meds do the same   . Allopurinol Hives, Swelling and Rash    FAMILY HISTORY:  indicated that his mother is deceased. He indicated that his father is deceased.  SOCIAL HISTORY:  reports that he has quit smoking. He has never used smokeless tobacco. He reports that he drinks alcohol. He reports that he does not use illicit drugs.  REVIEW OF SYSTEMS:   Negative except above  SUBJECTIVE:   VITAL SIGNS: Temp:  [97.1 F (36.2 C)-97.3 F (36.3 C)] 97.1 F (36.2 C) (04/09 0621) Pulse Rate:  [66-100] 78 (04/09 0745) Resp:  [14-24] 16 (04/09 0745) BP: (68-118)/(32-66) 112/44 mmHg (04/09 0745) SpO2:  [91 %-100 %] 100 % (04/09 0745) INTAKE / OUTPUT:  Intake/Output Summary (Last 24 hours) at 09/19/14 0808 Last data filed at 09/19/14 0603  Gross per 24 hour  Intake   1600 ml  Output     50 ml  Net   1550 ml    PHYSICAL EXAMINATION: General:  pleasant Neuro:  Alert, slightly garbled speech, follows commands, normal strength HEENT:  Pupils reactive, wears dentures Cardiovascular:  Regular, no murmur Lungs:  Scattered rhonchi, no  wheeze Abdomen:  Soft, non tender, no organomegaly Musculoskeletal:  No edema, Lt foot TMA Skin:  No rashes  LABS:  CBC  Recent Labs Lab 09/19/14 0505  WBC 19.4*  HGB 9.7*  HCT 29.7*  PLT 157   BMET  Recent Labs Lab 09/19/14 0505  NA 132*  K 5.0  CL 103  CO2 20  BUN 67*  CREATININE 3.67*  GLUCOSE 111*   Electrolytes  Recent Labs Lab 09/19/14 0505  CALCIUM 8.8   Sepsis Markers  Recent Labs Lab 09/19/14 0512  LATICACIDVEN 1.67   Liver Enzymes  Recent Labs Lab 09/19/14 0505  AST 14  ALT 14  ALKPHOS 82  BILITOT 0.6  ALBUMIN 2.9*   Cardiac Enzymes  Recent Labs Lab 09/19/14 0505  TROPONINI <0.03   Glucose No results for input(s): GLUCAP in the last 168 hours.  Imaging Ct Head Wo Contrast  09/19/2014   CLINICAL DATA:  Patient fell this morning. Weakness and hypotension.  EXAM: CT HEAD WITHOUT CONTRAST  TECHNIQUE: Contiguous axial images were obtained from the base of the skull through the vertex without intravenous contrast.  COMPARISON:  11/08/2013  FINDINGS: Diffuse cerebral atrophy. Mild  ventricular dilatation consistent with central atrophy. Low-attenuation changes in the deep white matter consistent small vessel ischemia. No mass effect or midline shift. No abnormal extra-axial fluid collections. Gray-white matter junctions are distinct. Basal cisterns are not effaced. No evidence of acute intracranial hemorrhage. No depressed skull fractures. Opacification of the left sphenoid sinus with mucosal thickening in the ethmoid air cells. Mastoid air cells are not opacified. Vascular calcifications.  IMPRESSION: No acute intracranial abnormalities. Chronic atrophy and small vessel ischemic changes. New opacification of the left sphenoid sinus.   Electronically Signed   By: Lucienne Capers M.D.   On: 09/19/2014 06:56   Dg Chest Port 1 View  09/19/2014   CLINICAL DATA:  Patient fell off of toilet.  Weakness.  Hypotension.  EXAM: PORTABLE CHEST - 1 VIEW   COMPARISON:  CT chest 11/08/2013.  Chest 11/08/2013.  FINDINGS: Shallow inspiration with elevation of the left hemidiaphragm. Cardiac enlargement. Diffuse interstitial pattern throughout the lungs consistent with fibrosis. No change since prior study. No focal consolidation. No blunting of costophrenic angles. No pneumothorax. Calcification of the aorta. Degenerative changes in the spine and shoulders. Old appearing right rib fracture.  IMPRESSION: Chronic interstitial fibrosis. Cardiac enlargement. No evidence of active infiltration.   Electronically Signed   By: Lucienne Capers M.D.   On: 09/19/2014 05:17      ASSESSMENT / PLAN:  PULMONARY A: Chronic interstitial changes on CXR. P:   Monitor oxygenation  CARDIOVASCULAR A:  Hypotension likely 2nd to hypovolemia. P:  Continue IV fluids Wean off levophed to keep SBP > 90 Check cortisol >> add solu cortef pending results Defer CVL for now  RENAL A:   AKI >> baseline creatinine 2.15 from 08/14/14. Stage 3 CKD. P:   Monitor renal fx  GASTROINTESTINAL A:   Nutrition. Jerrye Bushy. P:   CHO modified diet Protonix  HEMATOLOGIC A:   Anemia of chronic disease. P:  F/u CBC SQ heparin  INFECTIOUS A:   UTI. P:   Day 1 rocephin  Blood 4/09 >> Urine 4/09 >>  ENDOCRINE A:   DM with neuropathy and renal complications. Hx of hypothyroidism. Hx of gout. P:   SSI Synthroid Hold outpt colchicine  NEUROLOGIC A:   DM neuropathy. Depression. Deconditioning. P:   Paxil PRN tylenol PT  Will ask social work to assess home situation  Chesley Mires, MD [place] 09/19/2014, 8:36 AM Pager:  [phone] After 3pm call: [phone]

## 2014-09-19 NOTE — ED Notes (Signed)
CT called and informed pt can go to CT.

## 2014-09-19 NOTE — ED Notes (Signed)
Levophed increased back to 83mcg due to pts blood pressure dropping into the 80s.

## 2014-09-19 NOTE — ED Notes (Signed)
PCCM at the bedside 

## 2014-09-19 NOTE — ED Notes (Signed)
Per MD hold off on CT scan until pt's BP improves.

## 2014-09-19 NOTE — Progress Notes (Signed)
Bryn Gulling, pts nephew called to check status and well being of patient. Patient, Zachary Vargas gave full permission to speak with Bryn Gulling. Shanon Brow, informed me patient is longer married and his next best to kin is Shanon Brow himself. I followed up with patient, patient states this information is correct. Cell phone number left [phone]), placed in chart and informed we would call with any updated information. Shanon Brow stated he lived out of town and would arrive at our facility by Monday unless needed earlier.   Sanjuana Kava, RN

## 2014-09-20 ENCOUNTER — Inpatient Hospital Stay (HOSPITAL_COMMUNITY): Payer: Medicare Other

## 2014-09-20 DIAGNOSIS — N179 Acute kidney failure, unspecified: Secondary | ICD-10-CM

## 2014-09-20 DIAGNOSIS — A419 Sepsis, unspecified organism: Principal | ICD-10-CM

## 2014-09-20 DIAGNOSIS — G9340 Encephalopathy, unspecified: Secondary | ICD-10-CM

## 2014-09-20 DIAGNOSIS — A4150 Gram-negative sepsis, unspecified: Secondary | ICD-10-CM

## 2014-09-20 DIAGNOSIS — R7881 Bacteremia: Secondary | ICD-10-CM

## 2014-09-20 LAB — CBC
HEMATOCRIT: 27.7 % — AB (ref 39.0–52.0)
Hemoglobin: 8.9 g/dL — ABNORMAL LOW (ref 13.0–17.0)
MCH: 31.9 pg (ref 26.0–34.0)
MCHC: 32.1 g/dL (ref 30.0–36.0)
MCV: 99.3 fL (ref 78.0–100.0)
Platelets: 141 10*3/uL — ABNORMAL LOW (ref 150–400)
RBC: 2.79 MIL/uL — ABNORMAL LOW (ref 4.22–5.81)
RDW: 13.3 % (ref 11.5–15.5)
WBC: 9.6 10*3/uL (ref 4.0–10.5)

## 2014-09-20 LAB — BLOOD GAS, ARTERIAL
Acid-base deficit: 8.7 mmol/L — ABNORMAL HIGH (ref 0.0–2.0)
Bicarbonate: 16.7 mEq/L — ABNORMAL LOW (ref 20.0–24.0)
Drawn by: 10006
O2 Content: 2 L/min
O2 Saturation: 94 %
PCO2 ART: 39.1 mmHg (ref 35.0–45.0)
PH ART: 7.262 — AB (ref 7.350–7.450)
Patient temperature: 101.2
TCO2: 17.8 mmol/L (ref 0–100)
pO2, Arterial: 79.7 mmHg — ABNORMAL LOW (ref 80.0–100.0)

## 2014-09-20 LAB — GLUCOSE, CAPILLARY
GLUCOSE-CAPILLARY: 151 mg/dL — AB (ref 70–99)
GLUCOSE-CAPILLARY: 155 mg/dL — AB (ref 70–99)
GLUCOSE-CAPILLARY: 157 mg/dL — AB (ref 70–99)
Glucose-Capillary: 130 mg/dL — ABNORMAL HIGH (ref 70–99)
Glucose-Capillary: 155 mg/dL — ABNORMAL HIGH (ref 70–99)

## 2014-09-20 LAB — OCCULT BLOOD X 1 CARD TO LAB, STOOL: Fecal Occult Bld: POSITIVE — AB

## 2014-09-20 LAB — BASIC METABOLIC PANEL
Anion gap: 7 (ref 5–15)
BUN: 57 mg/dL — ABNORMAL HIGH (ref 6–23)
CO2: 22 mmol/L (ref 19–32)
Calcium: 8 mg/dL — ABNORMAL LOW (ref 8.4–10.5)
Chloride: 107 mmol/L (ref 96–112)
Creatinine, Ser: 2.73 mg/dL — ABNORMAL HIGH (ref 0.50–1.35)
GFR calc non Af Amer: 20 mL/min — ABNORMAL LOW (ref >90)
GFR, EST AFRICAN AMERICAN: 23 mL/min — AB (ref >90)
Glucose, Bld: 174 mg/dL — ABNORMAL HIGH (ref 70–99)
POTASSIUM: 3.7 mmol/L (ref 3.5–5.1)
SODIUM: 136 mmol/L (ref 135–145)

## 2014-09-20 LAB — CORTISOL: Cortisol, Plasma: 21.3 ug/dL

## 2014-09-20 LAB — MAGNESIUM: Magnesium: 2.4 mg/dL (ref 1.5–2.5)

## 2014-09-20 LAB — LACTIC ACID, PLASMA: Lactic Acid, Venous: 0.9 mmol/L (ref 0.5–2.0)

## 2014-09-20 LAB — CG4 I-STAT (LACTIC ACID): Lactic Acid, Venous: 0.61 mmol/L (ref 0.5–2.0)

## 2014-09-20 MED ORDER — SODIUM CHLORIDE 0.9 % IV SOLN
INTRAVENOUS | Status: DC
  Administered 2014-09-20: 22:00:00 via INTRAVENOUS

## 2014-09-20 MED ORDER — PIPERACILLIN-TAZOBACTAM 3.375 G IVPB
3.3750 g | Freq: Three times a day (TID) | INTRAVENOUS | Status: DC
  Administered 2014-09-20 – 2014-09-23 (×8): 3.375 g via INTRAVENOUS
  Filled 2014-09-20 (×12): qty 50

## 2014-09-20 MED ORDER — PIPERACILLIN-TAZOBACTAM IN DEX 2-0.25 GM/50ML IV SOLN
2.2500 g | Freq: Four times a day (QID) | INTRAVENOUS | Status: DC
  Administered 2014-09-20 (×2): 2.25 g via INTRAVENOUS
  Filled 2014-09-20 (×3): qty 50

## 2014-09-20 MED ORDER — SODIUM BICARBONATE 8.4 % IV SOLN
100.0000 meq | Freq: Once | INTRAVENOUS | Status: AC
  Administered 2014-09-20: 100 meq via INTRAVENOUS
  Filled 2014-09-20: qty 100

## 2014-09-20 NOTE — Progress Notes (Signed)
I spoke with nephew Shanon Brow about the patient's goals of care, and he reports the patient telling him, "When it's time to go, let the [facility] take me." His wife is an Therapist, sports and confirmed on the phone that he would want to be DNR/DNI. They have attempted to reach the patient's children without any luck, and the patient's wife is estranged and no longer involved with the patient's care. I affirmed that this is reasonable in the setting of his current illness, and he would like to be kept notified of any acute changes to the patient's conditions. We will continue current management with antibiotics and IV fluids.

## 2014-09-20 NOTE — Significant Event (Signed)
Called to bedside for altered mental status and tachypnea.    Evaluated patient personally.  He is alert, but confused.  He is easily redirected. He is tachypneic, but not in respiratory distress.  Labs from yesterday show worsening renal failure. Currently not on pressors.   Blood cultures now growing GNRs from anaerobic bottle.    Will broaden ceftriaxone to zosyn for now, draw stat labs, including ABG, lactate, CBC, BMP, and follow up on CXR.   I suspect this is altered mental status due to sepsis.  He is protecting his airway at this time.

## 2014-09-20 NOTE — Progress Notes (Signed)
Utilization review completed.  

## 2014-09-20 NOTE — Progress Notes (Signed)
PULMONARY  / CRITICAL CARE MEDICINE  Name: Zachary Vargas MRN: [id number] DOB: [birth year]    LOS: 14  REFERRING MD :  MD  CHIEF COMPLAINT:  Hypotension  BRIEF PATIENT DESCRIPTION: 81 yo male from home after falling off toilet. He had low BP in ER and PCCM asked to assess for sepsis.  STUDIES: 4/09 CT head >> chronic atrophy  SIGNIFICANT EVENTS:  4/9: BC with Gram- rods. Ceftriaxone switched to Zosyn.  INTERVAL HISTORY:  Overnight, found to have altered mental status and tachypneic but no respiratory distress. Blood cultures notable for Gram- rods so ctx changed to Zosyn. Lactate within normal limits.   VITAL SIGNS: Temp:  [97.7 F (36.5 C)-101.2 F (38.4 C)] 100.4 F (38 C) (04/10 0743) Pulse Rate:  [33-110] 33 (04/10 0900) Resp:  [14-26] 21 (04/10 1009) BP: (73-160)/(39-105) 102/52 mmHg (04/10 1009) SpO2:  [91 %-100 %] 99 % (04/10 1000) Weight:  [242 lb 15.2 oz (110.2 kg)] 242 lb 15.2 oz (110.2 kg) (04/10 0414) HEMODYNAMICS:   VENTILATOR SETTINGS:   INTAKE / OUTPUT: Intake/Output      04/09 0701 - 04/10 0700 04/10 0701 - 04/11 0700   I.V. (mL/kg) 4459.9 (40.5) 30 (0.3)   IV Piggyback 50    Total Intake(mL/kg) 4509.9 (40.9) 30 (0.3)   Urine (mL/kg/hr) 1850 (0.7) 150 (0.4)   Emesis/NG output 0 (0)    Stool 0 (0)    Total Output 1850 150   Net +2659.9 -120        Stool Occurrence 2 x    Emesis Occurrence 1 x      PHYSICAL EXAMINATION: General:  Elderly Caucasian male, no acute distress Neuro:  Arousable to sternal rub, able to follow commands, difficult to understand HEENT:  Pupils minimally reactive Cardiovascular:  Regular Lungs:  Scattered wheezing Abdomen:  Soft, non-tender, normoactive bowel sounds Musculoskeletal:  L foot s/p toe amputation Skin: Chronic skin changes in BLE with hypopigmentation   LABS: Cbc  Recent Labs Lab 09/19/14 0505  WBC 19.4*  HGB 9.7*  HCT 29.7*  PLT 157    Chemistry   Recent Labs Lab 09/19/14 0505  NA 132*   K 5.0  CL 103  CO2 20  BUN 67*  CREATININE 3.67*  CALCIUM 8.8  GLUCOSE 111*    Liver fxn  Recent Labs Lab 09/19/14 0505  AST 14  ALT 14  ALKPHOS 82  BILITOT 0.6  PROT 5.3*  ALBUMIN 2.9*   Sepsis markers  Recent Labs Lab 09/19/14 0512 09/20/14 0509  LATICACIDVEN 1.67 0.61   Cardiac markers  Recent Labs Lab 09/19/14 0505 09/19/14 0628  CKTOTAL  --  36  TROPONINI <0.03  --    BNP No results for input(s): PROBNP in the last 168 hours. ABG  Recent Labs Lab 09/20/14 0455  PHART 7.262*  PCO2ART 39.1  PO2ART 79.7*  HCO3 16.7*  TCO2 17.8    CBG trend  Recent Labs Lab 09/19/14 1209 09/19/14 1537 09/19/14 2148 09/20/14 0419 09/20/14 0746  GLUCAP 103* 135* 139* 130* 155*    IMAGING:  ECG:  DIAGNOSES:   ASSESSMENT / PLAN:  PULMONARY A: Chronic interstitial changes on CXR.: maintaining oxygen sats on 2L P:  Monitor oxygenation  CARDIOVASCULAR A:  Septic shock: off levophed P:  Continue IV fluids Solucortef 50mg  q6h  RENAL A:  AKI >> baseline creatinine 2.15 from 08/14/14. Stage 3 CKD. P:  Continue IV fluids Monitor renal fx  GASTROINTESTINAL A:  Nutrition. Jerrye Bushy. P:  CHO modified diet  Protonix  HEMATOLOGIC A:  Anemia of chronic disease: baseline 8-11. P:  F/u CBC SQ heparin  INFECTIOUS A:  Urosepsis: UA with bacteriuria P:  Blood 4/09 >> Gram- rods Continue Zosyn for now  ENDOCRINE A:  DM with neuropathy and renal complications. Hx of hypothyroidism. Hx of gout. P:  SSI Synthroid Hold outpt colchicine & allopurinol  NEUROLOGIC A:  Acute encephalopathy: likely 2/2 urosepsis DM neuropathy. Depression. Deconditioning. P:  Paxil PRN tylenol PT  CLINICAL SUMMARY: Zachary Vargas is an 81 year old male with DM2, PVD, CKD who presented with hypotension likely in the setting of urosepsis now with acute encephalopathy.   Charlott Rakes, PGY1 Internal Medicine Pager:  [phone]  09/20/2014, 10:46 AM   Reviewed above, and examined.  He has been weaned off pressors, but BP still tenuous.  He has GNR in blood cx likely from urinary source.  Mental status has been fluctuating.  He is somnolent, follows simple commands, b/l crackles, abd soft.  Continue Abx pending cx results, increase IV fluid, continue to monitor in ICU.  He has a nephew who is reported to be coming on 4/11 >> need to discuss goals of care and home situation.  CC time by me independent of resident time is 35 minutes.  Chesley Mires, MD [facility] Pulmonary/Critical Care 09/20/2014, 11:56 AM Pager:  [phone] After 3pm call: [phone]

## 2014-09-20 NOTE — Evaluation (Signed)
Physical Therapy Evaluation Patient Details Name: Zachary Vargas MRN: [id number] DOB: [birth year] Today's Date: 09/20/2014   History of Present Illness  Patient is an 81 yo male admitted 09/19/14 with UTI, sepsis, AKI, hypotension, and now AMS.  PMH:  lumbar stenosis, CAD, PAD, anxiety, depression, HTN, DM, neuropathy, CKD, Lt foot transmetatarsal amputation.  Clinical Impression  Patient presents with problems listed below.  Will benefit from acute PT to maximize independence prior to discharge. Recommend HHPT if patient's mental status clears.  If not, may need to consider SNF at discharge.    Follow Up Recommendations Home health PT;Supervision for mobility/OOB (may need to consider SNF if mental status does not clear, and patient does not make progress with therapy.)    Equipment Recommendations  None recommended by PT    Recommendations for Other Services       Precautions / Restrictions Precautions Precautions: Fall Restrictions Weight Bearing Restrictions: No      Mobility  Bed Mobility Overal bed mobility: Needs Assistance Bed Mobility: Supine to Sit;Sit to Supine     Supine to sit: Mod assist Sit to supine: Min assist   General bed mobility comments: Verbal and tactile cues for technique.  Able to move LE's off of bed.  Assist to raise trunk to sitting position.  Able to maintain static balance for 6 minutes with min guard assist.  Assist to bring LE's onto bed to return to supine.  Transfers Overall transfer level: Needs assistance Equipment used: 2 person hand held assist Transfers: Sit to/from Stand Sit to Stand: Mod assist;+2 physical assistance         General transfer comment: Verbal cues for hand placement.  Required +2 mod assist to power up to standing - 2 attempts.  Patient with flexed posture.  Able to take 3 steps to the side toward [facility] with +2 mod assist.  Fatigued.  Ambulation/Gait                Stairs            Wheelchair  Mobility    Modified Rankin (Stroke Patients Only)       Balance Overall balance assessment: Needs assistance         Standing balance support: Bilateral upper extremity supported Standing balance-Leahy Scale: Poor Standing balance comment: Required UE support to maintain balance                             Pertinent Vitals/Pain Pain Assessment: No/denies pain    Home Living Family/patient expects to be discharged to:: Private residence Living Arrangements: Alone Available Help at Discharge: Personal care attendant;Home health;Neighbor;Available PRN/intermittently Type of Home: House Home Access: Ramped entrance     Home Layout: Two level Home Equipment: Walker - 2 wheels;Bedside commode;Wheelchair - manual Additional Comments: Information from medical record.  Patient unable to provide information today.    Prior Function Level of Independence: Independent with assistive device(s)         Comments: H/o falls per chart     Hand Dominance        Extremity/Trunk Assessment   Upper Extremity Assessment: Overall WFL for tasks assessed           Lower Extremity Assessment: Generalized weakness (H/o neuropathy BLE's; Lt TMT amputation)         Communication   Communication: HOH  Cognition Arousal/Alertness: Lethargic Behavior During Therapy: Agitated;Anxious Overall Cognitive Status: Impaired/Different from baseline Area of Impairment:  Orientation;Attention;Memory;Safety/judgement;Awareness;Problem solving Orientation Level: Disoriented to;Place;Time;Situation Current Attention Level: Sustained Memory: Decreased short-term memory   Safety/Judgement: Decreased awareness of deficits;Decreased awareness of safety   Problem Solving: Slow processing;Difficulty sequencing;Requires verbal cues General Comments: Patient reports "I work here"  Unable to provide PLOF information    General Comments      Exercises        Assessment/Plan     PT Assessment Patient needs continued PT services  PT Diagnosis Difficulty walking;Generalized weakness;Altered mental status   PT Problem List Decreased strength;Decreased activity tolerance;Decreased balance;Decreased mobility;Decreased cognition;Decreased knowledge of use of DME;Decreased safety awareness;Impaired sensation  PT Treatment Interventions DME instruction;Gait training;Functional mobility training;Therapeutic activities;Therapeutic exercise;Balance training;Cognitive remediation;Patient/family education   PT Goals (Current goals can be found in the Care Plan section) Acute Rehab PT Goals Patient Stated Goal: Unable to state PT Goal Formulation: With patient Time For Goal Achievement: 09/27/14 Potential to Achieve Goals: Good    Frequency Min 3X/week   Barriers to discharge Decreased caregiver support Patient lives alone.  Has prn assist only.    Co-evaluation               End of Session   Activity Tolerance: Patient limited by fatigue;Patient limited by lethargy Patient left: in bed;with call bell/phone within reach;with bed alarm set Nurse Communication: Mobility status         Time: 6015-6153 PT Time Calculation (min) (ACUTE ONLY): 23 min   Charges:   PT Evaluation $Initial PT Evaluation Tier I: 1 Procedure PT Treatments $Therapeutic Activity: 8-22 mins   PT G Codes:        Despina Pole 10/17/2014, 3:09 PM Carita Pian. Sanjuana Kava, [place] Pager [phone]

## 2014-09-20 NOTE — Progress Notes (Addendum)
81yo male admitted w/ hypotension, now w/ blood cx growing GNR, to begin IV ABX.  Will start Zosyn 2.25g IV Q6H for CrCl 82ml/min (AKI) and monitor CBC, Cx, SCr.  Wynona Neat, PharmD, BCPS 09/20/2014 5:07 AM   --------------------------------------------------------------------------------------------------------- Addendum:  A repeat BMET this AM resulted showing an improvement in the patient's AKI requiring a Zosyn dose adjustment. SCr 2.73, CrCl~20-25 ml/min. Will adjust Zosyn dose.  Goal: Proper antibiotics for infection/cultures adjusted for renal/hepatic function   Plan: 1. Increase Zosyn to 3.375g IV every 8 hours (infused over 4 hours) 2. Will continue to follow renal function, culture results, LOT, and antibiotic de-escalation plans   Alycia Rossetti, PharmD, BCPS Clinical Pharmacist Pager: [phone] 09/20/2014 12:38 PM

## 2014-09-20 NOTE — Progress Notes (Signed)
MD called to bedside. Patient difficult to awaken. EKG changes noted. New onset of frequent PVCs. Resident present at bedside. Patient awake, slightly agitated. EKG ordered, labs pending. Resident stated to continue monitoring patient and changes will be made approprietly.

## 2014-09-20 NOTE — Progress Notes (Signed)
[facility] Progress Note Patient Name: Zachary Vargas DOB: Mar 17, 1928 MRN: [id number]   Date of Service  09/20/2014  HPI/Events of Note  Call from nurse reporting that patient is agitated, more alerted, with wheezing and appears to be more SOB.  Sats are adquate but with some tachycardia.  BP stable.  eICU Interventions  Plan: Stat ABG/PCXR KVO IVFs Fellow to evaluate at bedside     Intervention Category Intermediate Interventions: Respiratory distress - evaluation and management Minor Interventions: Agitation / anxiety - evaluation and management  DETERDING,ELIZABETH 09/20/2014, 4:39 AM

## 2014-09-20 NOTE — Progress Notes (Signed)
Dr. Jimmy Footman notified re: positive blood culture.

## 2014-09-20 NOTE — Progress Notes (Signed)
RN attempted to call lab at 1145 am for 4 am pending labs. Lab technician informed RN that 4 am labs were cancelled by "enterface," for unknown reason. Labs were drawn at 10 am by new order from physician and were pending. RN stressed the importance of lab results. 1218 Lab called again for results. Technician stated they would be resulted within 10 minutes. Once again stressed the importance of lab results. MD present at bedside. Aware of current situation. Will continue monitoring patient.   Sanjuana Kava, Therapist, sports, Copywriter, advertising, Contractor

## 2014-09-20 NOTE — Progress Notes (Signed)
3546 notified from laboratory of Aerobic Bottle positive for gram negative rods. 5681 Resident Posey Pronto present at bedside, informed and will make changes as necessary.

## 2014-09-20 NOTE — Progress Notes (Signed)
[facility] Progress Note Patient Name: Zachary Vargas DOB: 31-Oct-1927 MRN: [id number]   Date of Service  09/20/2014  HPI/Events of Note  Metabolic acidosis on ABG with pH of 7.26/39/79/17  Lactate on ABG is WNL.  eICU Interventions  Plan: 2 amps of bicarb IVP Consider Na Bicarb tablets     Intervention Category Major Interventions: Acid-Base disturbance - evaluation and management  DETERDING,ELIZABETH 09/20/2014, 5:17 AM

## 2014-09-21 ENCOUNTER — Inpatient Hospital Stay (HOSPITAL_COMMUNITY): Payer: Medicare Other

## 2014-09-21 DIAGNOSIS — R6520 Severe sepsis without septic shock: Secondary | ICD-10-CM

## 2014-09-21 LAB — BASIC METABOLIC PANEL
ANION GAP: 8 (ref 5–15)
BUN: 53 mg/dL — ABNORMAL HIGH (ref 6–23)
CALCIUM: 8.7 mg/dL (ref 8.4–10.5)
CO2: 21 mmol/L (ref 19–32)
Chloride: 111 mmol/L (ref 96–112)
Creatinine, Ser: 2.43 mg/dL — ABNORMAL HIGH (ref 0.50–1.35)
GFR calc non Af Amer: 23 mL/min — ABNORMAL LOW (ref >90)
GFR, EST AFRICAN AMERICAN: 27 mL/min — AB (ref >90)
Glucose, Bld: 149 mg/dL — ABNORMAL HIGH (ref 70–99)
POTASSIUM: 4.5 mmol/L (ref 3.5–5.1)
Sodium: 140 mmol/L (ref 135–145)

## 2014-09-21 LAB — GLUCOSE, CAPILLARY
GLUCOSE-CAPILLARY: 128 mg/dL — AB (ref 70–99)
GLUCOSE-CAPILLARY: 108 mg/dL — AB (ref 70–99)
Glucose-Capillary: 123 mg/dL — ABNORMAL HIGH (ref 70–99)
Glucose-Capillary: 118 mg/dL — ABNORMAL HIGH (ref 70–99)
Glucose-Capillary: 103 mg/dL — ABNORMAL HIGH (ref 70–99)

## 2014-09-21 LAB — CBC
HEMATOCRIT: 30.7 % — AB (ref 39.0–52.0)
Hemoglobin: 9.8 g/dL — ABNORMAL LOW (ref 13.0–17.0)
MCH: 31.8 pg (ref 26.0–34.0)
MCHC: 31.9 g/dL (ref 30.0–36.0)
MCV: 99.7 fL (ref 78.0–100.0)
Platelets: 146 10*3/uL — ABNORMAL LOW (ref 150–400)
RBC: 3.08 MIL/uL — AB (ref 4.22–5.81)
RDW: 13.4 % (ref 11.5–15.5)
WBC: 9.8 10*3/uL (ref 4.0–10.5)

## 2014-09-21 MED ORDER — LORAZEPAM 0.5 MG PO TABS
0.5000 mg | ORAL_TABLET | Freq: Once | ORAL | Status: AC
  Administered 2014-09-21: 0.5 mg via ORAL
  Filled 2014-09-21: qty 1

## 2014-09-21 MED ORDER — MUPIROCIN 2 % EX OINT
1.0000 "application " | TOPICAL_OINTMENT | Freq: Two times a day (BID) | CUTANEOUS | Status: DC
  Administered 2014-09-21 – 2014-09-23 (×5): 1 via NASAL
  Filled 2014-09-21: qty 22

## 2014-09-21 MED ORDER — CHLORHEXIDINE GLUCONATE CLOTH 2 % EX PADS
6.0000 | MEDICATED_PAD | Freq: Every day | CUTANEOUS | Status: DC
  Administered 2014-09-21 – 2014-09-23 (×3): 6 via TOPICAL

## 2014-09-21 MED ORDER — ENOXAPARIN SODIUM 30 MG/0.3ML ~~LOC~~ SOLN
30.0000 mg | SUBCUTANEOUS | Status: DC
  Administered 2014-09-21 – 2014-09-23 (×3): 30 mg via SUBCUTANEOUS
  Filled 2014-09-21 (×3): qty 0.3

## 2014-09-21 NOTE — Progress Notes (Signed)
Rehab Admissions Coordinator Note:  Patient was screened by Retta Diones for appropriateness for an Inpatient Acute Rehab Consult.  At this time, we are recommending Inpatient Rehab consult.  Retta Diones 09/21/2014, 1:38 PM  I can be reached at [phone].

## 2014-09-21 NOTE — Progress Notes (Signed)
Physical Therapy Treatment Patient Details Name: Zachary Vargas MRN: [id number] DOB: [birth year] Today's Date: 09/21/2014    History of Present Illness Patient is an 81 yo male admitted 09/19/14 with UTI, sepsis, AKI, hypotension, and now AMS.  PMH:  lumbar stenosis, CAD, PAD, anxiety, depression, HTN, DM, neuropathy, CKD, Lt foot transmetatarsal amputation.    PT Comments    Pt progressing slowly towards physical therapy goals. He is very independent and motivated to work with therapy, however tolerance for functional activity remains low at this time. Pt hesitant to agree to STR at the SNF level as he wants to return home. CIR may be an appropriate d/c disposition to improve independence with mobility to a mod I level. PTA pt was independently transferring and performing ADL's without assist. Will continue to follow.   Follow Up Recommendations  CIR;Supervision/Assistance - 24 hour     Equipment Recommendations  None recommended by PT    Recommendations for Other Services Rehab consult     Precautions / Restrictions Precautions Precautions: Fall Restrictions Weight Bearing Restrictions: No    Mobility  Bed Mobility               General bed mobility comments: Pt sitting up in chair upon PT arrival. Per RN, the maxi-sky lift was used to transfer OOB.  Transfers Overall transfer level: Needs assistance Equipment used: 2 person hand held assist Transfers: Sit to/from Stand Sit to Stand: Mod assist;Min assist;+2 physical assistance         General transfer comment: Pt initially required +2 mod assist to power-up to stand. On second attempt pt required +2 min assist. Pt with flexed knees throughout standing activity, and was able to complete minimal pre-gait activity.  Ambulation/Gait             General Gait Details: Unable to safely step away from the chair at this time. Pt able to perform x3 marches in place on each side.    Stairs             Wheelchair Mobility    Modified Rankin (Stroke Patients Only)       Balance Overall balance assessment: Needs assistance Sitting-balance support: Feet supported;No upper extremity supported Sitting balance-Leahy Scale: Fair     Standing balance support: Bilateral upper extremity supported;During functional activity Standing balance-Leahy Scale: Zero Standing balance comment: +2 assist required for support                    Cognition Arousal/Alertness: Awake/alert Behavior During Therapy: Anxious Overall Cognitive Status: Impaired/Different from baseline Area of Impairment: Safety/judgement;Awareness         Safety/Judgement: Decreased awareness of safety;Decreased awareness of deficits   Problem Solving: Slow processing;Difficulty sequencing;Requires verbal cues      Exercises      General Comments        Pertinent Vitals/Pain Pain Assessment: No/denies pain    Home Living                      Prior Function            PT Goals (current goals can now be found in the care plan section) Acute Rehab PT Goals Patient Stated Goal: Unable to state PT Goal Formulation: With patient Time For Goal Achievement: 09/27/14 Potential to Achieve Goals: Good Progress towards PT goals: Progressing toward goals    Frequency  Min 3X/week    PT Plan Current plan remains appropriate    Co-evaluation  End of Session Equipment Utilized During Treatment: Gait belt Activity Tolerance: Patient limited by fatigue Patient left: in chair;with call bell/phone within reach;with family/visitor present     Time: 5449-2010 PT Time Calculation (min) (ACUTE ONLY): 21 min  Charges:  $Therapeutic Activity: 8-22 mins                    G Codes:      Rolinda Roan 10-07-2014, 1:00 PM  Rolinda Roan, PT, DPT Acute Rehabilitation Services Pager: [phone]

## 2014-09-21 NOTE — Progress Notes (Addendum)
PULMONARY  / CRITICAL CARE MEDICINE  Name: Zachary Vargas MRN: [id number] DOB: 04/04/1928    LOS: 2  REFERRING MD :  MD  CHIEF COMPLAINT:  Hypotension  BRIEF PATIENT DESCRIPTION: 81 yo male from home after falling off toilet. He had low BP in ER and PCCM asked to assess for sepsis.  STUDIES: 4/09 CT head >> chronic atrophy  SIGNIFICANT EVENTS:  4/9: BC with Gram- rods. Ceftriaxone switched to Zosyn.  INTERVAL HISTORY:  Per conversation with his nephew yesterday, his code status was changed to DNR/DNI in line with previous statements that the patient had made to him.  This AM, he is able to answer questions and recalls the details prior to his hospitalization though does not recall much since he has been admitted.    VITAL SIGNS: Temp:  [97.4 F (36.3 C)-100.4 F (38 C)] 98.2 F (36.8 C) (04/11 0421) Pulse Rate:  [33-86] 71 (04/11 0600) Resp:  [13-31] 13 (04/11 0600) BP: (94-155)/(47-71) 143/70 mmHg (04/11 0600) SpO2:  [92 %-100 %] 100 % (04/11 0600) Weight:  [241 lb 2.9 oz (109.4 kg)] 241 lb 2.9 oz (109.4 kg) (04/11 0500) HEMODYNAMICS:   VENTILATOR SETTINGS:   INTAKE / OUTPUT: Intake/Output      04/10 0701 - 04/11 0700 04/11 0701 - 04/12 0700   I.V. (mL/kg) 1188.8 (10.9)    IV Piggyback 125    Total Intake(mL/kg) 1313.8 (12)    Urine (mL/kg/hr) 1325 (0.5)    Emesis/NG output     Stool     Total Output 1325     Net -11.3            PHYSICAL EXAMINATION: General:  Elderly Caucasian male, no acute distress Neuro:  Able to answer questions appropriately, moving extremities spontaneously HEENT:  Pupils sluggish though dilated Cardiovascular:  Regular Lungs:  Scattered wheezing Abdomen:  Soft, non-tender, normoactive bowel sounds Musculoskeletal:  L foot s/p toe amputation Skin: Chronic skin changes in BLE with hypopigmentation   LABS: Cbc  Recent Labs Lab 09/19/14 0505 09/20/14 1025 09/21/14 0451  WBC 19.4* 9.6 9.8  HGB 9.7* 8.9* 9.8*  HCT 29.7*  27.7* 30.7*  PLT 157 141* 146*    Chemistry   Recent Labs Lab 09/19/14 0505 09/20/14 1025 09/20/14 1600 09/21/14 0451  NA 132* 136  --  140  K 5.0 3.7  --  4.5  CL 103 107  --  111  CO2 20 22  --  21  BUN 67* 57*  --  53*  CREATININE 3.67* 2.73*  --  2.43*  CALCIUM 8.8 8.0*  --  8.7  MG  --   --  2.4  --   GLUCOSE 111* 174*  --  149*    Liver fxn  Recent Labs Lab 09/19/14 0505  AST 14  ALT 14  ALKPHOS 82  BILITOT 0.6  PROT 5.3*  ALBUMIN 2.9*   Sepsis markers  Recent Labs Lab 09/19/14 0512 09/20/14 0509 09/20/14 1025  LATICACIDVEN 1.67 0.61 0.9   Cardiac markers  Recent Labs Lab 09/19/14 0505 09/19/14 0628  CKTOTAL  --  89  TROPONINI <0.03  --    BNP No results for input(s): PROBNP in the last 168 hours. ABG  Recent Labs Lab 09/20/14 0455  PHART 7.262*  PCO2ART 39.1  PO2ART 79.7*  HCO3 16.7*  TCO2 17.8    CBG trend  Recent Labs Lab 09/20/14 0419 09/20/14 0746 09/20/14 1231 09/20/14 1557 09/20/14 2203  GLUCAP 130* 155* 151* 155* 157*  IMAGING:  ECG:  DIAGNOSES:   ASSESSMENT / PLAN:  PULMONARY A: Chronic interstitial changes on CXR: off O2 P:  Follow O2 sats  CARDIOVASCULAR A:  Septic shock: likely 2/2 UTI P:  Continue IV fluids Discontinue Solucortef 50mg  q6h given improved BP  RENAL A:  AKI >> Crt 2.4, baseline 2.2 from 08/14/14, though improving Stage 3 CKD. P:  Discontinue IV fluids as he is tolerating PO intake Monitor renal fx Check renal US to assess for other causes of AKI  GASTROINTESTINAL A:  Nutrition. GERD P:  CHO modified diet Protonix  HEMATOLOGIC A:  Anemia of chronic disease: baseline 8-11. FOBT+ yesterday but no signs of active bleeding; Hb recovered this AM. P:  F/u CBC SQ heparin  INFECTIOUS A:  Urosepsis: UA with bacteriuria P:  Blood 4/09 >> Gram- rods Abx Day 2: continue Zosyn pending speciation  ENDOCRINE A:  DM with neuropathy and renal  complications. Hx of hypothyroidism. Hx of gout. P:  SSI Synthroid Holding outpt colchicine & allopurinol  NEUROLOGIC A:  Acute encephalopathy: likely 2/2 urosepsis DM neuropathy. Depression. Deconditioning. P:  Paxil PRN tylenol PT  CLINICAL SUMMARY: Zachary Vargas is an 81 year old male with DM2, PVD, CKD who presented with hypotension likely in the setting of urosepsis now with improving acute encephalopathy though UTI etiology unknown. Stable for transfer to stepdown today. Social Work consulted for advance directives per caregivers' request.  Zachary Vargas, PGY1 Internal Medicine Pager: [phone]  09/21/2014, 7:39 AM   PCCM ATTENDING: I have reviewed pt's initial presentation, consultants notes and hospital database in detail.  The above assessment and plan was formulated under my direction.  In summary: Severe sepsis, septic shock GNR bacteremia Likely urinary tract source He is markedly improved Severe LE weakness, WC bound PTA He desires to return to home after discharge Have asked care mgmt to assess home needs and assist in procuring home health care support services He has also asked to formalize DNR status. Have requested Social Worker consultation Overall, he is markedly improved Will cont pip-tazo until sensitivities available Transfer to SDU today Cont work with PT TRH to assume care as of AM 4/12 and PCCM to sign off   Zachary Border, MD;  PCCM service; Mobile [phone]

## 2014-09-22 DIAGNOSIS — E1140 Type 2 diabetes mellitus with diabetic neuropathy, unspecified: Secondary | ICD-10-CM

## 2014-09-22 DIAGNOSIS — IMO0002 Reserved for concepts with insufficient information to code with codable children: Secondary | ICD-10-CM | POA: Diagnosis present

## 2014-09-22 DIAGNOSIS — R5381 Other malaise: Secondary | ICD-10-CM

## 2014-09-22 DIAGNOSIS — G9340 Encephalopathy, unspecified: Secondary | ICD-10-CM | POA: Diagnosis present

## 2014-09-22 DIAGNOSIS — E038 Other specified hypothyroidism: Secondary | ICD-10-CM | POA: Diagnosis present

## 2014-09-22 DIAGNOSIS — N189 Chronic kidney disease, unspecified: Secondary | ICD-10-CM

## 2014-09-22 DIAGNOSIS — E1165 Type 2 diabetes mellitus with hyperglycemia: Secondary | ICD-10-CM

## 2014-09-22 DIAGNOSIS — F329 Major depressive disorder, single episode, unspecified: Secondary | ICD-10-CM

## 2014-09-22 DIAGNOSIS — N179 Acute kidney failure, unspecified: Secondary | ICD-10-CM | POA: Diagnosis present

## 2014-09-22 DIAGNOSIS — R7881 Bacteremia: Secondary | ICD-10-CM | POA: Diagnosis present

## 2014-09-22 DIAGNOSIS — A419 Sepsis, unspecified organism: Secondary | ICD-10-CM | POA: Diagnosis present

## 2014-09-22 DIAGNOSIS — R6521 Severe sepsis with septic shock: Secondary | ICD-10-CM

## 2014-09-22 LAB — CULTURE, BLOOD (ROUTINE X 2)

## 2014-09-22 LAB — GLUCOSE, CAPILLARY
GLUCOSE-CAPILLARY: 102 mg/dL — AB (ref 70–99)
Glucose-Capillary: 83 mg/dL (ref 70–99)
Glucose-Capillary: 110 mg/dL — ABNORMAL HIGH (ref 70–99)

## 2014-09-22 MED ORDER — AMOXICILLIN-POT CLAVULANATE 875-125 MG PO TABS: 1.0000 | ORAL_TABLET | Freq: Two times a day (BID) | ORAL | Status: DC

## 2014-09-22 MED ORDER — SODIUM CHLORIDE 0.9 % IV SOLN
INTRAVENOUS | Status: DC
  Administered 2014-09-22: 1000 mL via INTRAVENOUS

## 2014-09-22 NOTE — Progress Notes (Signed)
[place] TEAM 1 - Stepdown/ICU TEAM Progress Note  TYSON PARKISON PRF:[id number] DOB: 03-02-1928 DOA: 09/19/2014 PCP: Wyatt Haste, MD  Admit HPI / Brief Narrative: 81 yo WM PMHx  anxiety, depression, substance abuse, diabetes type 2 uncontrolled, HTN, PAD, chronic back pain, chronic kidney disease, hypothyroidism,  Presented ED; male fell at home while trying to go to bathroom. He had trouble getting up, and called 911. In ER he was found to have u/a suggestive of UTI and hypotensive. He denies chest pain, dizziness, dyspnea, abdominal pain, nausea, vomiting, diarrhea, fever. He has not been able to walk since he had knee surgery. He lives alone and does not have any other family in the area.   HPI/Subjective: 4/12 A/O 4, NAD,  Assessment/Plan: Chronic interstitial changes on CXR -Resolved  Septic shock: Secondary to Proteus mirabilis bacteremia - Secondary to bacteremia -Normal saline 75 ml/hr -Continue antibiotics for 2 weeks  Acute on chronic renal failure baseline Cr=2.2 from 08/14/14,  -Continue normal saline 75 ml/hr -Patient's renal function improving but not back to baseline.  -Check renal US ; nondiagnostic for other causes  Anemia of chronic disease: -Follow closely  DM with neuropathy and nephropathy/neuropathy. -Hemoglobin A1c pending -Lipid panel pending   Hypothyroidism. -TSH pending -Continue Synthroid 75 g daily  Gout. -Continue to hold colchicine & allopurinol  Acute encephalopathy: - Likely secondary to bacteremia, resolving  Deconditioning. -Patient states although he has been wheelchair-bound secondary to poor conditioning does not want to be discharged to CIR -NCM to speak with patient again concerning discharge planning  Depression -Paxil 40 mg daily   Code Status: FULL Family Communication: no family present at time of exam Disposition Plan: CIR vs home health    Consultants: [facility]  M)    Procedure/Significant Events: 4/11 renal ultrasound;Marland Kitchen Bilateral renal parenchymal thinning. No hydronephrosis.-Lower pole calculi bilaterally   Culture 4/9 blood right arm/hand positive Proteus Mirabilis 4/9 MRSA by PCR positive   Antibiotics: Zosyn 4/10>>    DVT prophylaxis: Lovenox   Devices NA   LINES / TUBES:  NA    Continuous Infusions: . sodium chloride Stopped (09/20/14 1100)  . sodium chloride      Objective: VITAL SIGNS: Temp: 98.5 F (36.9 C) (04/12 1714) Temp Source: Oral (04/12 1714) BP: 157/74 mmHg (04/12 1714) Pulse Rate: 86 (04/12 1714) SPO2; FIO2:   Intake/Output Summary (Last 24 hours) at 09/22/14 1804 Last data filed at 09/22/14 1700  Gross per 24 hour  Intake  332.5 ml  Output   1601 ml  Net -1268.5 ml     Exam: General: A/O 4, NAD, No acute respiratory distress, deconditioned Lungs: by basilar crackles without wheezes or crackles Cardiovascular: Regular rate and rhythm without murmur gallop or rub normal S1 and S2 Abdomen: Nontender, nondistended, soft, bowel sounds positive, no rebound, no ascites, no appreciable mass Extremities: No significant cyanosis, clubbing, or edema bilateral lower extremities  Data Reviewed: Basic Metabolic Panel:  Recent Labs Lab 09/19/14 0505 09/20/14 1025 09/20/14 1600 09/21/14 0451  NA 132* 136  --  140  K 5.0 3.7  --  4.5  CL 103 107  --  111  CO2 20 22  --  21  GLUCOSE 111* 174*  --  149*  BUN 67* 57*  --  53*  CREATININE 3.67* 2.73*  --  2.43*  CALCIUM 8.8 8.0*  --  8.7  MG  --   --  2.4  --    Liver Function Tests:  Recent Labs Lab 09/19/14 0505  AST 14  ALT 14  ALKPHOS 82  BILITOT 0.6  PROT 5.3*  ALBUMIN 2.9*   No results for input(s): LIPASE, AMYLASE in the last 168 hours. No results for input(s): AMMONIA in the last 168 hours. CBC:  Recent Labs Lab 09/19/14 0505 09/20/14 1025 09/21/14 0451  WBC 19.4* 9.6 9.8  NEUTROABS 15.8*  --   --   HGB 9.7* 8.9*  9.8*  HCT 29.7* 27.7* 30.7*  MCV 100.0 99.3 99.7  PLT 157 141* 146*   Cardiac Enzymes:  Recent Labs Lab 09/19/14 0505 09/19/14 0628  CKTOTAL  --  36  TROPONINI <0.03  --    BNP (last 3 results) No results for input(s): BNP in the last 8760 hours.  ProBNP (last 3 results) No results for input(s): PROBNP in the last 8760 hours.  CBG:  Recent Labs Lab 09/21/14 1538 09/21/14 1952 09/21/14 2242 09/22/14 0814 09/22/14 1208  GLUCAP 118* 103* 108* 102* 83    Recent Results (from the past 240 hour(s))  Culture, blood (routine x 2)     Status: None   Collection Time: 09/19/14  5:00 AM  Result Value Ref Range Status   Specimen Description BLOOD RIGHT ARM  Final   Special Requests BOTTLES DRAWN AEROBIC AND ANAEROBIC 10CC EACH  Final   Culture   Final    PROTEUS MIRABILIS Note: Culture results may be compromised due to an excessive volume of blood received in culture bottles. Gram Stain Report Called to,Read Back By and Verified With: Candelaria Celeste RN on 09/20/14 at 04:30 by Rise Mu Performed at [facility]    Report Status 09/22/2014 FINAL  Final   Organism ID, Bacteria PROTEUS MIRABILIS  Final      Susceptibility   Proteus mirabilis - MIC*    AMPICILLIN 8 SENSITIVE Sensitive     AMPICILLIN/SULBACTAM 4 SENSITIVE Sensitive     CEFAZOLIN <=4 SENSITIVE Sensitive     CEFEPIME <=1 SENSITIVE Sensitive     CEFTAZIDIME <=1 SENSITIVE Sensitive     CEFTRIAXONE <=1 SENSITIVE Sensitive     CIPROFLOXACIN >=4 RESISTANT Resistant     GENTAMICIN >=16 RESISTANT Resistant     IMIPENEM <=0.25 SENSITIVE Sensitive     PIP/TAZO <=4 SENSITIVE Sensitive     TOBRAMYCIN 8 INTERMEDIATE Intermediate     TRIMETH/SULFA >=320 RESISTANT Resistant     * PROTEUS MIRABILIS  Culture, blood (routine x 2)     Status: None   Collection Time: 09/19/14  5:05 AM  Result Value Ref Range Status   Specimen Description BLOOD RIGHT HAND  Final   Special Requests BOTTLES DRAWN AEROBIC ONLY 10CC  Final    Culture   Final    PROTEUS MIRABILIS Note: SUSCEPTIBILITIES PERFORMED ON PREVIOUS CULTURE WITHIN THE LAST 5 DAYS. Note: Gram Stain Report Called to,Read Back By and Verified With: CRYSTAL M RN 09/20/14 AT 52 AM BY [facility] Performed at [facility]    Report Status 09/22/2014 FINAL  Final  MRSA PCR Screening     Status: Abnormal   Collection Time: 09/19/14 10:10 AM  Result Value Ref Range Status   MRSA by PCR POSITIVE (A) NEGATIVE Final    Comment:        The GeneXpert MRSA Assay (FDA approved for NASAL specimens only), is one component of a comprehensive MRSA colonization surveillance program. It is not intended to diagnose MRSA infection nor to guide or monitor treatment for MRSA infections. RESULT CALLED TO, READ  BACK BY AND VERIFIED WITH: C.MANUS,RN 09/19/14 @1300  BY V.WILKINS      Studies:  Recent x-ray studies have been reviewed in detail by the Attending Physician  Scheduled Meds:  Scheduled Meds: . Chlorhexidine Gluconate Cloth  6 each Topical Q0600  . dorzolamide-timolol  1 drop Both Eyes BID  . enoxaparin (LOVENOX) injection  30 mg Subcutaneous Q24H  . insulin aspart  0-15 Units Subcutaneous TID WC  . insulin aspart  0-5 Units Subcutaneous QHS  . latanoprost  1 drop Right Eye QHS  . levothyroxine  75 mcg Oral QAC breakfast  . mupirocin ointment  1 application Nasal BID  . pantoprazole  40 mg Oral Q1200  . PARoxetine  40 mg Oral Daily  . piperacillin-tazobactam (ZOSYN)  IV  3.375 g Intravenous Q8H    Time spent on care of this patient: 40 mins   WOODS, Geraldo Docker , MD  [facility] Office  [phone] Pager - [phone]  On-Call/Text Page:      [email or web address]      password [email or web address]  If 7PM-7AM, please contact night-coverage [email or web address] Password [email or web address] 09/22/2014, 6:04 PM   LOS: 3 days   Care during the described time interval was provided by me .  I have reviewed this patient's available data, including medical history, events of note,  physical examination, radiology studies and test results as part of my evaluation  Dia Crawford, MD [phone] Pager

## 2014-09-22 NOTE — Progress Notes (Addendum)
NURSING PROGRESS NOTE  Zachary Vargas [id number] Transfer Data: 09/22/2014 5:19 PM Attending Provider: Allie Bossier, Zachary Vargas Zachary Vargas,Zachary Juanda Crumble, Zachary Vargas Code Status: DNR  Zachary Vargas is a 81 y.o. male patient transferred from 39M -No acute distress noted.  -No complaints of shortness of breath.  -No complaints of chest pain.   Last Documented Vital Signs: Blood pressure 157/74, pulse 86, temperature 98.5 F (36.9 C), temperature source Oral, resp. rate 25, height 6\' 3"  (1.905 m), weight 109.4 kg (241 lb 2.9 oz), SpO2 92 %.  IV Fluids:  IV in place, occlusive dsg intact without redness, IV cath wrist left, condition patent and no redness normal saline @75 .   Allergies:  Morphine and related; Statins; and Allopurinol  Past Medical History:   has a past medical history of Glaucoma; Dyslipidemia; Gout; BPH (benign prostatic hyperplasia); History of peptic ulcer disease; Peptic ulcer disease; Diverticulosis; Lumbar spinal stenosis; Carotid atherosclerosis; Peripheral arterial disease; Epigastric pain; Arthritis; Myeloma; Headache(784.0); Constipation; Urinary frequency; Anxiety; Insomnia; Anemia; Osteomyelitis; Hypertension; Peripheral edema; Pneumonia; Peripheral neuropathy; Joint pain; Joint swelling; Chronic back pain; Chronic kidney disease; Hypothyroidism; GERD (gastroesophageal reflux disease); Depression; Gout; Diabetes mellitus; Cataract; and Substance abuse.  Past Surgical History:   has past surgical history that includes Abdominal aortic aneurysm repair (03/1998); Abdominal hernia repair (2000); renal stone surgery (1999); nerve root block; Colonoscopy (07/22/2004); Eye surgery; Amputation (Left, 11/29/2012); Amputation (Left, 01/03/2013); Tonsillectomy; Vasectomy; Amputation (Left, 03/19/2013); Esophagogastroduodenoscopy; Total knee arthroplasty (Right, 10/15/2013); Hernia repair (N/A, 2000); Colonoscopy (N/A, 12/01/2013); Esophagogastroduodenoscopy (N/A, 12/01/2013); and  Esophagogastroduodenoscopy (12/01/2013).  Social History:   reports that he has quit smoking. He has never used smokeless tobacco. He reports that he drinks alcohol. He reports that he does not use illicit drugs.  Skin: skin tears to bilat lower extremities -  Pink foam in place stage II to right buttock pink foam in place.   Patient/Family orientated to room. Information packet given to patient/family. Admission inpatient armband information verified with patient/family to include name and date of birth and placed on patient arm. Side rails up x 2, fall assessment and education completed with patient/family. Patient/family able to verbalize understanding of risk associated with falls and verbalized understanding to call for assistance before getting out of bed. Call light within reach. Patient/family able to voice and demonstrate understanding of unit orientation instructions.    Will continue to evaluate and treat per Zachary Vargas orders.

## 2014-09-23 LAB — CBC WITH DIFFERENTIAL/PLATELET
BASOS ABS: 0 10*3/uL (ref 0.0–0.1)
Basophils Relative: 0 % (ref 0–1)
Eosinophils Absolute: 0.2 10*3/uL (ref 0.0–0.7)
Eosinophils Relative: 3 % (ref 0–5)
HEMATOCRIT: 27.6 % — AB (ref 39.0–52.0)
Hemoglobin: 9.2 g/dL — ABNORMAL LOW (ref 13.0–17.0)
LYMPHS PCT: 14 % (ref 12–46)
Lymphs Abs: 1.1 10*3/uL (ref 0.7–4.0)
MCH: 33.1 pg (ref 26.0–34.0)
MCHC: 33.3 g/dL (ref 30.0–36.0)
MCV: 99.3 fL (ref 78.0–100.0)
Monocytes Absolute: 1.4 10*3/uL — ABNORMAL HIGH (ref 0.1–1.0)
Monocytes Relative: 17 % — ABNORMAL HIGH (ref 3–12)
NEUTROS ABS: 5.3 10*3/uL (ref 1.7–7.7)
NEUTROS PCT: 66 % (ref 43–77)
Platelets: 133 10*3/uL — ABNORMAL LOW (ref 150–400)
RBC: 2.78 MIL/uL — ABNORMAL LOW (ref 4.22–5.81)
RDW: 13.3 % (ref 11.5–15.5)
WBC: 7.9 10*3/uL (ref 4.0–10.5)

## 2014-09-23 LAB — COMPREHENSIVE METABOLIC PANEL
ALT: 18 U/L (ref 0–53)
AST: 19 U/L (ref 0–37)
Albumin: 2.1 g/dL — ABNORMAL LOW (ref 3.5–5.2)
Alkaline Phosphatase: 82 U/L (ref 39–117)
Anion gap: 12 (ref 5–15)
BUN: 32 mg/dL — ABNORMAL HIGH (ref 6–23)
CO2: 21 mmol/L (ref 19–32)
Calcium: 8.4 mg/dL (ref 8.4–10.5)
Chloride: 105 mmol/L (ref 96–112)
Creatinine, Ser: 1.79 mg/dL — ABNORMAL HIGH (ref 0.50–1.35)
GFR, EST AFRICAN AMERICAN: 38 mL/min — AB (ref >90)
GFR, EST NON AFRICAN AMERICAN: 33 mL/min — AB (ref >90)
GLUCOSE: 110 mg/dL — AB (ref 70–99)
POTASSIUM: 3.4 mmol/L — AB (ref 3.5–5.1)
SODIUM: 138 mmol/L (ref 135–145)
Total Bilirubin: 0.6 mg/dL (ref 0.3–1.2)
Total Protein: 4.8 g/dL — ABNORMAL LOW (ref 6.0–8.3)

## 2014-09-23 LAB — LIPID PANEL
CHOL/HDL RATIO: 4.3 ratio
Cholesterol: 142 mg/dL (ref 0–200)
HDL: 33 mg/dL — AB (ref >39)
LDL Cholesterol: 75 mg/dL (ref 0–99)
TRIGLYCERIDES: 171 mg/dL — AB (ref <150)
VLDL: 34 mg/dL (ref 0–40)

## 2014-09-23 LAB — GLUCOSE, CAPILLARY
GLUCOSE-CAPILLARY: 88 mg/dL (ref 70–99)
GLUCOSE-CAPILLARY: 107 mg/dL — AB (ref 70–99)
Glucose-Capillary: 115 mg/dL — ABNORMAL HIGH (ref 70–99)

## 2014-09-23 LAB — MAGNESIUM: Magnesium: 1.7 mg/dL (ref 1.5–2.5)

## 2014-09-23 LAB — TSH: TSH: 5.563 u[IU]/mL — ABNORMAL HIGH (ref 0.350–4.500)

## 2014-09-23 MED ORDER — LISINOPRIL 10 MG PO TABS
10.0000 mg | ORAL_TABLET | Freq: Every day | ORAL | Status: DC
Start: 2014-09-23 — End: 2014-09-23
  Administered 2014-09-23: 10 mg via ORAL
  Filled 2014-09-23: qty 1

## 2014-09-23 MED ORDER — LISINOPRIL 10 MG PO TABS
10.0000 mg | ORAL_TABLET | Freq: Every day | ORAL | Status: AC
Start: 2014-09-23 — End: ?

## 2014-09-23 MED ORDER — CEPHALEXIN 500 MG PO CAPS: 500.0000 mg | ORAL_CAPSULE | Freq: Two times a day (BID) | ORAL | Status: AC

## 2014-09-23 MED ORDER — CEFAZOLIN SODIUM 1-5 GM-% IV SOLN
1.0000 g | Freq: Three times a day (TID) | INTRAVENOUS | Status: DC
  Administered 2014-09-23: 1 g via INTRAVENOUS
  Filled 2014-09-23 (×2): qty 50

## 2014-09-23 NOTE — Care Management Note (Signed)
    Page 1 of 2   09/23/2014     3:11:10 PM CARE MANAGEMENT NOTE 09/23/2014  Patient:  Zachary Vargas, Zachary Vargas   Account Number:  [id number]  Date Initiated:  09/23/2014  Documentation initiated by:  Carles Collet  Subjective/Objective Assessment:   Admitted with Sepsis - UTI.  from home alone     Action/Plan:   pt eval- rec hhpt   Anticipated DC Date:  09/23/2014   Anticipated DC Plan:  [place]  CM consult      [facility] Choice  HOME HEALTH   Choice offered to / List presented to:  C-1 Patient        [place] arranged  HH-1 RN  [place].   Status of service:  Completed, signed off Medicare Important Message given?  YES (If response is "NO", the following Medicare IM given date fields will be blank) Date Medicare IM given:  09/23/2014 Medicare IM given by:  Carles Collet Date Additional Medicare IM given:   Additional Medicare IM given by:    Discharge Disposition:  [place]  Per UR Regulation:    If discussed at Long Length of Stay Meetings, dates discussed:    Comments:  09-23-14 SW contacted for ambulance transportation home, Miranda at [facility] contacted for [facility] RN and PT. Carles Collet RN BSN CM  ContactMartino, [place]   [phone]  09-21-14 11:43am Luz Lex, [phone] Lives at home alone.  Wants to be as independent as possible.  has w/c at home which is how he gets around. uses a chair lift to get upstairs.  has 2 BSC's - one up and one down and a tub chair for bathing.  Would like to go home if can but wants to do so safely - PT ordered and will assist with input on safe discharge.  Not opposed to going to SNF for rehab  is that is what he needs, but worries about his home and bills when not at home.  If goes home would like [facility] agency, recommend - RN, PT, NA and SW to follow home.   CM will continue to follow.

## 2014-09-23 NOTE — Progress Notes (Signed)
Pt being discharged to home via ambulance. Discharge instructions reviewed and given to patient along with care notes for new medications. Pt verbalized understanding . IV d/c and skin intact.

## 2014-09-23 NOTE — Progress Notes (Signed)
Physical Therapy Treatment Patient Details Name: Zachary Vargas MRN: [id number] DOB: [birth year] Today's Date: 09/23/2014    History of Present Illness Patient is an 81 yo male admitted 09/19/14 with UTI, sepsis, AKI, hypotension, and now AMS.  PMH:  lumbar stenosis, CAD, PAD, anxiety, depression, HTN, DM, neuropathy, CKD, Lt foot transmetatarsal amputation.    PT Comments    Discussed that going home was not optimal, but since he was adamant about home, I hoped that he would work with therapy to help them further modify his environment.   Follow Up Recommendations  Home health PT;Other (comment) (pt refused to go to CIR or SNF.  Wanted to go home.)     Equipment Recommendations  None recommended by PT    Recommendations for Other Services       Precautions / Restrictions Precautions Precautions: Fall Restrictions Weight Bearing Restrictions: No    Mobility  Bed Mobility Overal bed mobility: Needs Assistance Bed Mobility: Supine to Sit     Supine to sit: Min guard     General bed mobility comments: Increased time to scoot all the way out to EOB. Pt appears to have increased difficulty scooting over the lowered bed rail but was able to achieve goal without physical assistance.   Transfers Overall transfer level: Needs assistance Equipment used: Rolling walker (2 wheeled) Transfers: Sit to/from Stand Sit to Stand: Min guard;Min assist         General transfer comment: VC's for hand placement on seated surface for safety. Close guard for safety but no physical assist provided. Pt appeared to struggle to power-up to full stand and increased time was required.   Ambulation/Gait Ambulation/Gait assistance: Min guard Ambulation Distance (Feet): 10 Feet (x2) Assistive device: Rolling walker (2 wheeled) Gait Pattern/deviations: Step-to pattern Gait velocity: Decreased Gait velocity interpretation: Below normal speed for age/gender General Gait Details: Shuffle gait on  left with bilateral knee flexed throughout.  Fatigued quickly   Financial trader Rankin (Stroke Patients Only)       Balance Overall balance assessment: Needs assistance Sitting-balance support: No upper extremity supported Sitting balance-Leahy Scale: Good     Standing balance support: Single extremity supported;Bilateral upper extremity supported Standing balance-Leahy Scale: Poor Standing balance comment: Requires UE support to maintain balance.                     Cognition Arousal/Alertness: Awake/alert Behavior During Therapy: WFL for tasks assessed/performed Overall Cognitive Status: Within Functional Limits for tasks assessed Area of Impairment: Safety/judgement   Current Attention Level: Selective     Safety/Judgement: Decreased awareness of safety   Problem Solving: Slow processing      Exercises      General Comments General comments (skin integrity, edema, etc.): Discussed how his condo was set up and how he managed.Related to pt that with all of his modifications there were still several areas/modifications that he and PT or OT needed to work on      Pertinent Vitals/Pain Pain Assessment: No/denies pain    Home Living                      Prior Function            PT Goals (current goals can now be found in the care plan section) Acute Rehab PT Goals Patient Stated Goal: Unable to state PT Goal Formulation: With patient  Time For Goal Achievement: 09/27/14 Potential to Achieve Goals: Good Progress towards PT goals: Progressing toward goals    Frequency  Min 3X/week    PT Plan Current plan remains appropriate    Co-evaluation             End of Session Equipment Utilized During Treatment: Gait belt Activity Tolerance: Patient tolerated treatment well Patient left: in chair;with call bell/phone within reach     Time: 1515-1540 PT Time Calculation (min) (ACUTE ONLY): 25  min  Charges:  $Therapeutic Activity: 8-22 mins $Self Care/Home Management: Mar 03, 2023                    G Codes:      Genice Kimberlin, Tessie Fass 09/23/2014, 4:29 PM 09/23/2014  Donnella Sham, PT [phone]  (pager)

## 2014-09-23 NOTE — Discharge Summary (Signed)
Physician Discharge Summary  Zachary Vargas LKT:[id number] DOB: 09/08/1927 DOA: 09/19/2014  PCP: Wyatt Haste, MD  Admit date: 09/19/2014 Discharge date: 09/23/2014  Time spent: greater than 30 minutes  Recommendations for Outpatient Follow-up:  1. Home with home health 2. Monitor BMET  Discharge Diagnoses:  Active Problems:   UTI (lower urinary tract infection)   Bacteremia due to Gram-negative bacteria   Septic shock   Acute on chronic renal failure   Type 2 diabetes, uncontrolled, with neuropathy   Other specified hypothyroidism   Acute encephalopathy   Physical deconditioning   Discharge Condition: stable  Diet recommendation: diabetic  Filed Weights   09/20/14 0414 09/21/14 0500  Weight: 110.2 kg (242 lb 15.2 oz) 109.4 kg (241 lb 2.9 oz)    History of present illness/Hospital Course:  81 yo WM PMHx anxiety, depression, substance abuse, diabetes type 2 uncontrolled, HTN, PAD, chronic back pain, chronic kidney disease, hypothyroidism,  Presented ED; male fell at home while trying to go to bathroom. He had trouble getting up, and called 911. In ER he was found to have u/a suggestive of UTI and hypotensive. He denies chest pain, dizziness, dyspnea, abdominal pain, nausea, vomiting, diarrhea, fever. He has not been able to walk since he had knee surgery. He lives alone and does not have any other family in the area. Initially admitted to ICU, PCCM. Transferred to hospitalists when stable  Assessment/Plan: Chronic interstitial changes on CXR -Resolved  Septic shock: Secondary to Proteus mirabilis bacteremia - Secondary to bacteremia  Acute on chronic renal failure baseline Cr=2.2 from 08/14/14,  Improved with hydration but not back to baseline.  -Check renal US without hydronephrosis  Acute encephalopathy: - Likely secondary to bacteremia, resolving  Deconditioning. -Patient states although he has been wheelchair-bound secondary to poor conditioning  does not want to be discharged to [facility] go home with home services  Depression -Paxil 40 mg daily  Procedures:  none  Consultations:  [facility]  Discharge Exam: Filed Vitals:   09/23/14 1411  BP: 177/71  Pulse: 84  Temp: 98.5 F (36.9 C)  Resp: 20    General: a and o Cardiovascular: RRR Respiratory: CTA  Discharge Instructions   Discharge Instructions    Diet - low sodium heart healthy    Complete by:  As directed      Diet Carb Modified    Complete by:  As directed      Increase activity slowly    Complete by:  As directed           Current Discharge Medication List    START taking these medications   Details  cephALEXin (KEFLEX) 500 MG capsule Take 1 capsule (500 mg total) by mouth 2 (two) times daily. Qty: 20 capsule, Refills: 0      CONTINUE these medications which have CHANGED   Details  lisinopril (PRINIVIL,ZESTRIL) 10 MG tablet Take 1 tablet (10 mg total) by mouth daily. Patient is to take one tablet Qty: 270 tablet, Refills: 1      CONTINUE these medications which have NOT CHANGED   Details  cholecalciferol (VITAMIN D) 1000 UNITS tablet Take 1,000 Units by mouth daily.    colchicine 0.6 MG tablet Take 0.6 mg by mouth daily.    dorzolamide-timolol (COSOPT) 22.3-6.8 MG/ML ophthalmic solution Place 1 drop into both eyes 2 (two) times daily.     febuxostat (ULORIC) 40 MG tablet Take 40 mg by mouth daily.    ferrous gluconate (FERGON) 325 MG tablet Take  325 mg by mouth daily with breakfast.      hydrOXYzine (ATARAX/VISTARIL) 25 MG tablet Take 1 tablet (25 mg total) by mouth 3 (three) times daily as needed. Qty: 90 tablet, Refills: 1    latanoprost (XALATAN) 0.005 % ophthalmic solution Place 1 drop into the right eye at bedtime.      levothyroxine (SYNTHROID, LEVOTHROID) 75 MCG tablet Take 1 tablet (75 mcg total) by mouth daily before breakfast. Qty: 90 tablet, Refills: 1    loratadine (CLARITIN) 10 MG tablet Take 10 mg by mouth daily.     Multiple Vitamins-Minerals (MULTIVITAMIN WITH MINERALS) tablet Take 1 tablet by mouth daily.    nortriptyline (PAMELOR) 50 MG capsule Take 1 capsule (50 mg total) by mouth at bedtime. Qty: 90 capsule, Refills: 3   Associated Diagnoses: Hereditary and idiopathic peripheral neuropathy    Omega-3 Fatty Acids (FISH OIL) 1000 MG CAPS Take 1,000 mg by mouth daily.    pantoprazole (PROTONIX) 20 MG tablet Take 1 tablet (20 mg total) by mouth 2 (two) times daily before a meal. Qty: 30 tablet, Refills: 0    PARoxetine (PAXIL) 40 MG tablet Take 1 tablet (40 mg total) by mouth every morning. Qty: 90 tablet, Refills: 1    polyethylene glycol (MIRALAX / GLYCOLAX) packet Take 17 g by mouth 2 (two) times daily. Qty: 14 each, Refills: 0    Blood Glucose Monitoring Suppl (FREESTYLE FREEDOM LITE) W/DEVICE KIT 1 each by Does not apply route 2 (two) times daily. Patient test bid DX:E11.9 Qty: 1 each, Refills: 0    glucose blood (FREESTYLE LITE) test strip Patient is to test bid DX: E11.9Use as instructed Qty: 200 each, Refills: 3      STOP taking these medications     acetaminophen (TYLENOL) 500 MG tablet        Allergies  Allergen Reactions  . Morphine And Related Hives and Shortness Of Breath  . Statins Hives    Pt notes that "all" classes of cholesterol meds do the same   . Allopurinol Hives, Swelling and Rash   Follow-up Information    Follow up with Wyatt Haste, MD.   Specialty:  Family Medicine   Contact information:   [address] [place] [address] [phone]        The results of significant diagnostics from this hospitalization (including imaging, microbiology, ancillary and laboratory) are listed below for reference.    Significant Diagnostic Studies: Ct Head Wo Contrast  09/19/2014   CLINICAL DATA:  Patient fell this morning. Weakness and hypotension.  EXAM: CT HEAD WITHOUT CONTRAST  TECHNIQUE: Contiguous axial images were obtained from the base of  the skull through the vertex without intravenous contrast.  COMPARISON:  11/08/2013  FINDINGS: Diffuse cerebral atrophy. Mild ventricular dilatation consistent with central atrophy. Low-attenuation changes in the deep white matter consistent small vessel ischemia. No mass effect or midline shift. No abnormal extra-axial fluid collections. Gray-white matter junctions are distinct. Basal cisterns are not effaced. No evidence of acute intracranial hemorrhage. No depressed skull fractures. Opacification of the left sphenoid sinus with mucosal thickening in the ethmoid air cells. Mastoid air cells are not opacified. Vascular calcifications.  IMPRESSION: No acute intracranial abnormalities. Chronic atrophy and small vessel ischemic changes. New opacification of the left sphenoid sinus.   Electronically Signed   By: Lucienne Capers M.D.   On: 09/19/2014 06:56   US Renal  09/21/2014   CLINICAL DATA:  Bacteremia, history of kidney disease. History of AAA repair 1999. Renal  stones surgery. History of diabetes, hypertension, osteomyelitis.  EXAM: RENAL/URINARY TRACT ULTRASOUND COMPLETE  COMPARISON:  CT of the abdomen and pelvis on 01/31/2014  FINDINGS: Right Kidney:  Length: 13.8 cm. Renal parenchymal thinning. Lower pole calculus is identified, measuring 0.8 by 1.1 cm. No hydronephrosis. Large renal pelvis calculus seen on the prior study is not imaged or is no longer present.  Left Kidney:  Length: 12.6 cm. Renal parenchymal thinning. Lower pole calculus is 0.7 x 1.3 cm. A lower pole cyst is 2.0 x 1.7 x 1.9 cm. Large renal pelvic calculus seen on the prior CT study is not imaged or is no longer present. No hydronephrosis.  Bladder:  Appears normal for degree of bladder distention.  IMPRESSION: 1. Bilateral renal parenchymal thinning. 2. No hydronephrosis. 3. Lower pole calculi bilaterally.   Electronically Signed   By: Nolon Nations M.D.   On: 09/21/2014 22:18   Dg Chest Port 1 View  09/20/2014   CLINICAL DATA:   Respiratory distress.  Shortness of breath.  EXAM: PORTABLE CHEST - 1 VIEW  COMPARISON:  09/19/2014  FINDINGS: Shallow inspiration. Cardiac enlargement. Interstitial pattern throughout the lungs consistent with chronic interstitial fibrosis. Mild increased density in the perihilar regions may indicate developing infiltration or edema. No blunting of costophrenic angles. No pneumothorax. Calcified and tortuous aorta.  IMPRESSION: Cardiac enlargement. Chronic interstitial changes in the lungs. Possible developing perihilar infiltration or edema.   Electronically Signed   By: Lucienne Capers M.D.   On: 09/20/2014 05:37   Dg Chest Port 1 View  09/19/2014   CLINICAL DATA:  Patient fell off of toilet.  Weakness.  Hypotension.  EXAM: PORTABLE CHEST - 1 VIEW  COMPARISON:  CT chest 11/08/2013.  Chest 11/08/2013.  FINDINGS: Shallow inspiration with elevation of the left hemidiaphragm. Cardiac enlargement. Diffuse interstitial pattern throughout the lungs consistent with fibrosis. No change since prior study. No focal consolidation. No blunting of costophrenic angles. No pneumothorax. Calcification of the aorta. Degenerative changes in the spine and shoulders. Old appearing right rib fracture.  IMPRESSION: Chronic interstitial fibrosis. Cardiac enlargement. No evidence of active infiltration.   Electronically Signed   By: Lucienne Capers M.D.   On: 09/19/2014 05:17    Microbiology: Recent Results (from the past 240 hour(s))  Culture, blood (routine x 2)     Status: None   Collection Time: 09/19/14  5:00 AM  Result Value Ref Range Status   Specimen Description BLOOD RIGHT ARM  Final   Special Requests BOTTLES DRAWN AEROBIC AND ANAEROBIC 10CC EACH  Final   Culture   Final    PROTEUS MIRABILIS Note: Culture results may be compromised due to an excessive volume of blood received in culture bottles. Gram Stain Report Called to,Read Back By and Verified With: Candelaria Celeste RN on 09/20/14 at 04:30 by Rise Mu Performed at [facility]    Report Status 09/22/2014 FINAL  Final   Organism ID, Bacteria PROTEUS MIRABILIS  Final      Susceptibility   Proteus mirabilis - MIC*    AMPICILLIN 8 SENSITIVE Sensitive     AMPICILLIN/SULBACTAM 4 SENSITIVE Sensitive     CEFAZOLIN <=4 SENSITIVE Sensitive     CEFEPIME <=1 SENSITIVE Sensitive     CEFTAZIDIME <=1 SENSITIVE Sensitive     CEFTRIAXONE <=1 SENSITIVE Sensitive     CIPROFLOXACIN >=4 RESISTANT Resistant     GENTAMICIN >=16 RESISTANT Resistant     IMIPENEM <=0.25 SENSITIVE Sensitive     PIP/TAZO <=4 SENSITIVE Sensitive  TOBRAMYCIN 8 INTERMEDIATE Intermediate     TRIMETH/SULFA >=320 RESISTANT Resistant     * PROTEUS MIRABILIS  Culture, blood (routine x 2)     Status: None   Collection Time: 09/19/14  5:05 AM  Result Value Ref Range Status   Specimen Description BLOOD RIGHT HAND  Final   Special Requests BOTTLES DRAWN AEROBIC ONLY 10CC  Final   Culture   Final    PROTEUS MIRABILIS Note: SUSCEPTIBILITIES PERFORMED ON PREVIOUS CULTURE WITHIN THE LAST 5 DAYS. Note: Gram Stain Report Called to,Read Back By and Verified With: CRYSTAL M RN 09/20/14 AT 95 AM BY [facility] Performed at [facility]    Report Status 09/22/2014 FINAL  Final  MRSA PCR Screening     Status: Abnormal   Collection Time: 09/19/14 10:10 AM  Result Value Ref Range Status   MRSA by PCR POSITIVE (A) NEGATIVE Final    Comment:        The GeneXpert MRSA Assay (FDA approved for NASAL specimens only), is one component of a comprehensive MRSA colonization surveillance program. It is not intended to diagnose MRSA infection nor to guide or monitor treatment for MRSA infections. RESULT CALLED TO, READ BACK BY AND VERIFIED WITH: C.MANUS,RN 09/19/14 _0  BY V.WILKINS      Labs: Basic Metabolic Panel:  Recent Labs Lab 09/19/14 0505 09/20/14 1025 09/20/14 1600 09/21/14 0451 09/23/14 0614  NA 132* 136  --  140 138  K 5.0 3.7  --  4.5 3.4*  CL 103  107  --  111 105  CO2 20 22  --  21 21  GLUCOSE 111* 174*  --  149* 110*  BUN 67* 57*  --  53* 32*  CREATININE 3.67* 2.73*  --  2.43* 1.79*  CALCIUM 8.8 8.0*  --  8.7 8.4  MG  --   --  2.4  --  1.7   Liver Function Tests:  Recent Labs Lab 09/19/14 0505 09/23/14 0614  AST 14 19  ALT 14 18  ALKPHOS 82 82  BILITOT 0.6 0.6  PROT 5.3* 4.8*  ALBUMIN 2.9* 2.1*   No results for input(s): LIPASE, AMYLASE in the last 168 hours. No results for input(s): AMMONIA in the last 168 hours. CBC:  Recent Labs Lab 09/19/14 0505 09/20/14 1025 09/21/14 0451 09/23/14 0614  WBC 19.4* 9.6 9.8 7.9  NEUTROABS 15.8*  --   --  5.3  HGB 9.7* 8.9* 9.8* 9.2*  HCT 29.7* 27.7* 30.7* 27.6*  MCV 100.0 99.3 99.7 99.3  PLT 157 141* 146* 133*   Cardiac Enzymes:  Recent Labs Lab 09/19/14 0505 09/19/14 0628  CKTOTAL  --  32  TROPONINI <0.03  --    BNP: BNP (last 3 results) No results for input(s): BNP in the last 8760 hours.  ProBNP (last 3 results) No results for input(s): PROBNP in the last 8760 hours.  CBG:  Recent Labs Lab 09/22/14 1208 09/22/14 1712 09/22/14 2106 09/23/14 0811 09/23/14 1125  GLUCAP 83 115* 110* 88 107*       Signed:  Muzammil Bruins L  [facility] 09/23/2014, 2:27 PM

## 2014-09-23 NOTE — Progress Notes (Signed)
Physical Therapy Treatment Patient Details Name: Zachary Vargas MRN: [id number] DOB: [birth year] Today's Date: 09/23/2014    History of Present Illness Patient is an 81 yo male admitted 09/19/14 with UTI, sepsis, AKI, hypotension, and now AMS.  PMH:  lumbar stenosis, CAD, PAD, anxiety, depression, HTN, DM, neuropathy, CKD, Lt foot transmetatarsal amputation.    PT Comments    Pt progressing towards physical therapy goals. Friend present during beginning of session while discussing home support. Pt under the impression that his son will be available to care for him at d/c, although friend indicated that son is not reliable. Pt is declining any follow-up rehab that is outside of the home (SNF or CIR), however feel that pt will require 24 hour assist for a short time until pt is back to baseline of function. Encouraged pt to try and confirm with son and other friends that he will have assistance at d/c (preferably 24 hour assist) if he decides to refuse rehab. Overall pt is progressing with mobility, and was able to ambulate the distance required to get from his wheelchair to the toilet at home. Will continue to follow and progress as able per POC.   Follow Up Recommendations  CIR;Supervision/Assistance - 24 hour     Equipment Recommendations  None recommended by PT    Recommendations for Other Services       Precautions / Restrictions Precautions Precautions: Fall Restrictions Weight Bearing Restrictions: No    Mobility  Bed Mobility Overal bed mobility: Needs Assistance Bed Mobility: Supine to Sit     Supine to sit: Min guard     General bed mobility comments: Increased time to scoot all the way out to EOB. Pt appears to have increased difficulty scooting over the lowered bed rail but was able to achieve goal without physical assistance.   Transfers Overall transfer level: Needs assistance Equipment used: Rolling walker (2 wheeled) Transfers: Sit to/from Stand Sit to Stand:  Min guard         General transfer comment: VC's for hand placement on seated surface for safety. Close guard for safety but no physical assist provided. Pt appeared to struggle to power-up to full stand and increased time was required.   Ambulation/Gait Ambulation/Gait assistance: Min guard Ambulation Distance (Feet): 5 Feet Assistive device: Rolling walker (2 wheeled) Gait Pattern/deviations: Step-to pattern;Decreased stride length;Trunk flexed Gait velocity: Decreased Gait velocity interpretation: Below normal speed for age/gender General Gait Details: Pt was able to ambulate bed to chair with close guard for safety. Pt unsteady at times but did not require assist to recover. Pt reports that at home he will not have to ambulate any farther than this.   Stairs            Wheelchair Mobility    Modified Rankin (Stroke Patients Only)       Balance Overall balance assessment: Needs assistance Sitting-balance support: Feet supported;No upper extremity supported Sitting balance-Leahy Scale: Good     Standing balance support: Bilateral upper extremity supported Standing balance-Leahy Scale: Poor Standing balance comment: Requires UE support to maintain balance.                     Cognition Arousal/Alertness: Awake/alert Behavior During Therapy: WFL for tasks assessed/performed Overall Cognitive Status: Impaired/Different from baseline Area of Impairment: Safety/judgement         Safety/Judgement: Decreased awareness of safety          Exercises      General Comments  Pertinent Vitals/Pain Pain Assessment: No/denies pain    Home Living                      Prior Function            PT Goals (current goals can now be found in the care plan section) Acute Rehab PT Goals Patient Stated Goal: Unable to state PT Goal Formulation: With patient Time For Goal Achievement: 09/27/14 Potential to Achieve Goals: Good Progress towards  PT goals: Progressing toward goals    Frequency  Min 3X/week    PT Plan Current plan remains appropriate    Co-evaluation             End of Session Equipment Utilized During Treatment: Gait belt Activity Tolerance: Patient tolerated treatment well;No increased pain Patient left: in chair;with call bell/phone within reach     Time: 0915-0937 PT Time Calculation (min) (ACUTE ONLY): 22 min  Charges:  $Therapeutic Activity: 8-22 mins                    G Codes:      Rolinda Roan 10-09-14, 2:12 PM   Rolinda Roan, PT, DPT Acute Rehabilitation Services Pager: [phone]

## 2014-09-23 NOTE — Clinical Social Work Note (Signed)
Clinical Social Worker received notification from CM that patient will be returning home with home health and requesting ambulance transport home.  CSW completed paperwork and will arrange ambulance for 16:00 per RN request.  Clinical Social Worker will sign off for now as social work intervention is no longer needed. Please consult [place] again if new need arises.  Barbette Or, [place]

## 2014-09-24 LAB — HEMOGLOBIN A1C
HEMOGLOBIN A1C: 5.7 % — AB (ref 4.8–5.6)
MEAN PLASMA GLUCOSE: 117 mg/dL

## 2014-09-25 ENCOUNTER — Telehealth: Payer: Self-pay | Admitting: Internal Medicine

## 2014-09-25 ENCOUNTER — Other Ambulatory Visit: Payer: Self-pay | Admitting: Family Medicine

## 2014-09-25 MED ORDER — ALPRAZOLAM 0.25 MG PO TABS: 0.2500 mg | ORAL_TABLET | Freq: Every evening | ORAL | Status: AC | PRN

## 2014-09-25 NOTE — Telephone Encounter (Signed)
I called and I ordered in system the medication per Chana Bode PA. I left the patient a voicemail about the medications

## 2014-09-25 NOTE — Telephone Encounter (Signed)
Call out Xanax 0.25mg  1 tablet QHS prn, #15, no refill and f/u with Dr. Redmond School

## 2014-09-25 NOTE — Telephone Encounter (Signed)
Pt is not able to sleep recently since his fall and is terrified that something is going to happen to him if he shuts his eye. He would like something for a few days for sleep. He is coming in Tuesday to see Dr. Redmond School. Can you send something in to [facility] at [facility]

## 2014-09-28 ENCOUNTER — Telehealth: Payer: Self-pay | Admitting: Internal Medicine

## 2014-09-28 ENCOUNTER — Telehealth: Payer: Self-pay | Admitting: Family Medicine

## 2014-09-28 ENCOUNTER — Other Ambulatory Visit: Payer: Self-pay

## 2014-09-28 MED ORDER — FUROSEMIDE 20 MG PO TABS: ORAL_TABLET | ORAL | Status: AC

## 2014-09-28 NOTE — Telephone Encounter (Signed)
Patient said his legs are swelling and needs to get refill he only takes it as needed please advise

## 2014-09-28 NOTE — Telephone Encounter (Signed)
Pt called for refill of [facility].

## 2014-09-28 NOTE — Telephone Encounter (Signed)
catilyn from [facility] called stating that pt fall this morning outside and had to call EMS to come get him but he is okay. He has a stage 1 Ucler on his bottom and they put a foam dressing on it and will do a referral to the sound care nurse to look at it and give her recommendations and let us know and then pt has edema on both feet. Pt is coming in tomorrow but Catilyn just wanted you to know whats going on

## 2014-09-29 ENCOUNTER — Encounter: Payer: Self-pay | Admitting: Family Medicine

## 2014-09-29 ENCOUNTER — Ambulatory Visit (INDEPENDENT_AMBULATORY_CARE_PROVIDER_SITE_OTHER): Payer: Medicare Other | Admitting: Family Medicine

## 2014-09-29 VITALS — BP 86/46 | HR 63 | Wt 230.0 lb

## 2014-09-29 DIAGNOSIS — I952 Hypotension due to drugs: Secondary | ICD-10-CM

## 2014-09-29 DIAGNOSIS — Z91199 Patient's noncompliance with other medical treatment and regimen due to unspecified reason: Secondary | ICD-10-CM

## 2014-09-29 DIAGNOSIS — R627 Adult failure to thrive: Secondary | ICD-10-CM

## 2014-09-29 DIAGNOSIS — R609 Edema, unspecified: Secondary | ICD-10-CM

## 2014-09-29 DIAGNOSIS — Z9119 Patient's noncompliance with other medical treatment and regimen: Secondary | ICD-10-CM

## 2014-09-29 DIAGNOSIS — L89300 Pressure ulcer of unspecified buttock, unstageable: Secondary | ICD-10-CM | POA: Diagnosis not present

## 2014-09-29 DIAGNOSIS — R531 Weakness: Secondary | ICD-10-CM | POA: Diagnosis not present

## 2014-09-29 LAB — CBC WITH DIFFERENTIAL/PLATELET
Basophils Absolute: 0 10*3/uL (ref 0.0–0.1)
Basophils Relative: 0 % (ref 0–1)
EOS ABS: 0.6 10*3/uL (ref 0.0–0.7)
Eosinophils Relative: 7 % — ABNORMAL HIGH (ref 0–5)
HEMATOCRIT: 28.3 % — AB (ref 39.0–52.0)
Hemoglobin: 9.1 g/dL — ABNORMAL LOW (ref 13.0–17.0)
LYMPHS PCT: 24 % (ref 12–46)
Lymphs Abs: 2.1 10*3/uL (ref 0.7–4.0)
MCH: 31.3 pg (ref 26.0–34.0)
MCHC: 32.2 g/dL (ref 30.0–36.0)
MCV: 97.3 fL (ref 78.0–100.0)
MONOS PCT: 9 % (ref 3–12)
MPV: 9.7 fL (ref 8.6–12.4)
Monocytes Absolute: 0.8 10*3/uL (ref 0.1–1.0)
NEUTROS PCT: 60 % (ref 43–77)
Neutro Abs: 5.2 10*3/uL (ref 1.7–7.7)
Platelets: 316 10*3/uL (ref 150–400)
RBC: 2.91 MIL/uL — AB (ref 4.22–5.81)
RDW: 13.9 % (ref 11.5–15.5)
WBC: 8.6 10*3/uL (ref 4.0–10.5)

## 2014-09-29 LAB — COMPREHENSIVE METABOLIC PANEL
ALT: 13 U/L (ref 0–53)
AST: 17 U/L (ref 0–37)
Albumin: 2.8 g/dL — ABNORMAL LOW (ref 3.5–5.2)
Alkaline Phosphatase: 88 U/L (ref 39–117)
BILIRUBIN TOTAL: 0.3 mg/dL (ref 0.2–1.2)
BUN: 32 mg/dL — AB (ref 6–23)
CO2: 20 mEq/L (ref 19–32)
Calcium: 8 mg/dL — ABNORMAL LOW (ref 8.4–10.5)
Chloride: 104 mEq/L (ref 96–112)
Creat: 2.24 mg/dL — ABNORMAL HIGH (ref 0.50–1.35)
GLUCOSE: 70 mg/dL (ref 70–99)
Potassium: 4 mEq/L (ref 3.5–5.3)
SODIUM: 140 meq/L (ref 135–145)
Total Protein: 5.6 g/dL — ABNORMAL LOW (ref 6.0–8.3)

## 2014-09-29 NOTE — Patient Instructions (Addendum)
Don't take the lisinopril(Prinivil) and furosemide. Quit drinking

## 2014-09-29 NOTE — Progress Notes (Signed)
   Subjective:    Patient ID: Zachary Vargas, male    DOB: 1927/10/14, 81 y.o.   MRN: [id number]  HPI He is here for a recheck. He admits to drinking alcohol but states his last drink was around 10:00 this morning. It is presently slightly before 3. He has early decubitus and will be followed by home health area they're scheduled to see him tomorrow. He states that he will let them help with that. He is getting help from his neighbors concerning his daily care. He admits to being quite weak. He has checked into assisted living but is adamantly opposed to that in spite of my strongly encouraging him to do that.   Review of Systems     Objective:   Physical Exam Alert and slightly lethargic with slight slurred speech. Exam of the buttock area does show slight erythema but no induration in the bilateral gluteal area. Lower extremities show 1-2+ pitting edema.       Assessment & Plan:  Hypotension due to drugs - Plan: CBC with Differential/Platelet, Comprehensive metabolic panel  Generalized weakness - Plan: CBC with Differential/Platelet, Comprehensive metabolic panel, Ethanol, CANCELED: Ethanol  Decubitus ulcer of buttock, unspecified laterality, unstageable - Plan: CBC with Differential/Platelet, Comprehensive metabolic panel  Dependent edema  Failure to thrive in adult - Plan: Ethanol, CANCELED: Ethanol  Personal history of noncompliance with medical treatment, presenting hazards to health - Plan: Ethanol I strongly encouraged him to look into assisted living but he is adamantly opposed to that. Also discussed going to the emergency room due to his low blood pressure. He again is not interested in that. He does admit to drinking. I explained that his present lifestyle could and causing him his life. He is to stop the Lasix and lisinopril. Recheck here in 2 weeks.

## 2014-09-29 NOTE — Telephone Encounter (Signed)
Dr. Redmond School asked me to call [facility] and make sure they know about his meds being stopped. Spoke with Caitlyn today with [facility] and she will be back out there Thursday to see patient and i told her to make sure that she gets rid of the lisinopril and lasix as he has been asked to stop those by the doctor due to low blood pressure and to quite his drinking as well. Pt will talk with patient on Thursday about this.

## 2014-09-30 LAB — ETHANOL: Alcohol, Ethyl (B): 92 mg/dL — ABNORMAL HIGH (ref 0–10)

## 2014-10-01 ENCOUNTER — Telehealth: Payer: Self-pay | Admitting: Family Medicine

## 2014-10-01 NOTE — Telephone Encounter (Signed)
Pt called and stated that his mouth is so dry. No matter how much he drinks it still feels dry. He would like you to call him and advise him. Pt can be reached at [phone] and uses [facility].

## 2014-10-01 NOTE — Telephone Encounter (Signed)
Pt.notified

## 2014-10-01 NOTE — Telephone Encounter (Signed)
Make sure he is hydrated and urinating several times per day and you can also use lemon drops

## 2014-10-05 ENCOUNTER — Telehealth: Payer: Self-pay | Admitting: Family Medicine

## 2014-10-05 MED ORDER — HYDROXYZINE HCL 25 MG PO TABS: 25.0000 mg | ORAL_TABLET | Freq: Three times a day (TID) | ORAL | Status: DC | PRN

## 2014-10-05 NOTE — Telephone Encounter (Signed)
Pt called and requested refill on Hydroxyine 25 mg [facility]

## 2014-10-12 ENCOUNTER — Telehealth: Payer: Self-pay | Admitting: Family Medicine

## 2014-10-12 NOTE — Telephone Encounter (Signed)
Marlis Edelson, nurse @ [facility], called to report that pt fell in his home yesterday afternoon. He does not have any injuries.

## 2014-10-13 ENCOUNTER — Ambulatory Visit: Payer: Medicare Other | Admitting: Family Medicine

## 2014-10-15 ENCOUNTER — Telehealth: Payer: Self-pay

## 2014-10-15 NOTE — Telephone Encounter (Signed)
[facility] Nurse Monica Martinez?) called requesting a verbal order for Social Work Consult. [phone]

## 2014-10-15 NOTE — Telephone Encounter (Signed)
I took care of it 

## 2014-10-21 ENCOUNTER — Telehealth: Payer: Self-pay | Admitting: Family Medicine

## 2014-10-21 NOTE — Telephone Encounter (Signed)
Patient called and wanted to let you know he fell again this morning.  At the same place in his house as last time and injured same area.  I asked what was injured and he said 2 fingers cut, superficially.  Fell between [employer] and the bed.  I offered him an appointment and he said he had no way here.  He just wanted you to know.

## 2014-10-21 NOTE — Telephone Encounter (Signed)
Nurse with [facility], Raynelle Highland, requesting an order for portable chest xray be faxed to their office. Pt threw up hot dog while eating lunch today and he's had a gurggly cough since then. Raynelle Highland concerned that pt may have aspirated. Raynelle Highland is with the pt now and pt is still coughing and gurggling. Raynelle Highland did not have [facility]'s fax # but she said to fax it to the main office on [facility]

## 2014-10-22 ENCOUNTER — Ambulatory Visit: Payer: Medicare Other | Attending: Family Medicine | Admitting: Rehabilitative and Restorative Service Providers"

## 2014-10-22 ENCOUNTER — Telehealth: Payer: Self-pay | Admitting: Internal Medicine

## 2014-10-22 NOTE — Telephone Encounter (Signed)
Tell him to go ahead and start taking MiraLAX.and at least right now milk of magnesia

## 2014-10-22 NOTE — Telephone Encounter (Signed)
Pt finally had a bowel movement today. He takes miralax daily

## 2014-10-22 NOTE — Telephone Encounter (Signed)
darnelle from [facility] called stating she is at the pt house right now and he is complicating of constipation. He has been unable to go for 6 days. He has taken stool softener twice a day and drinking water and even tried heating up prune juice to see if that will help and nothing has helped. What would you like to do?

## 2014-12-25 ENCOUNTER — Other Ambulatory Visit: Payer: Self-pay | Admitting: Family Medicine

## 2014-12-25 ENCOUNTER — Telehealth: Payer: Self-pay | Admitting: Family Medicine

## 2014-12-25 MED ORDER — HYDROXYZINE HCL 25 MG PO TABS: 25.0000 mg | ORAL_TABLET | Freq: Three times a day (TID) | ORAL | Status: DC | PRN

## 2014-12-25 MED ORDER — PAROXETINE HCL 40 MG PO TABS: 40.0000 mg | ORAL_TABLET | ORAL | Status: AC

## 2014-12-25 NOTE — Telephone Encounter (Addendum)
NEW PHARMACY     Requesting refill on Hydroxyzine 25mg  to [facility]  Pt called back and also requested a refill on Paroxetine 40MG 

## 2014-12-25 NOTE — Telephone Encounter (Signed)
Send 68mo supply and 1 refill to pharmacy

## 2014-12-25 NOTE — Telephone Encounter (Signed)
Both medications was sent

## 2014-12-30 ENCOUNTER — Telehealth: Payer: Self-pay | Admitting: Family Medicine

## 2014-12-30 NOTE — Telephone Encounter (Signed)
Pt wants to know if he can get motorized wheel chair approved now. He said he was told that our office put it on hold a little while back. Pt says he is a "prisoner in his own home" and need help

## 2014-12-30 NOTE — Telephone Encounter (Signed)
I have not put anything on hold. He will  need a referral to PT to evaluate his functional status.Set it up

## 2014-12-31 ENCOUNTER — Other Ambulatory Visit: Payer: Self-pay

## 2014-12-31 DIAGNOSIS — R5381 Other malaise: Secondary | ICD-10-CM

## 2014-12-31 NOTE — Telephone Encounter (Signed)
I have put referral in for pt to have functional status done

## 2014-12-31 NOTE — Telephone Encounter (Signed)
Pt has been notified.

## 2015-01-19 ENCOUNTER — Ambulatory Visit: Payer: Medicare Other | Admitting: Family Medicine

## 2015-02-01 ENCOUNTER — Telehealth: Payer: Self-pay | Admitting: Family Medicine

## 2015-02-01 ENCOUNTER — Other Ambulatory Visit: Payer: Self-pay

## 2015-02-01 MED ORDER — HYDROXYZINE HCL 25 MG PO TABS
25.0000 mg | ORAL_TABLET | Freq: Three times a day (TID) | ORAL | Status: AC | PRN
Start: 2015-02-01 — End: ?

## 2015-02-01 NOTE — Telephone Encounter (Signed)
ALERT THIS REFILL GOES TO LOCAL PHARMACY  Pt called for a refill of Hydroxyzine. Needs to be just a 30 day supply because he has a refill coming from mail order but he is out. Send to [facility]. He can be reached at [phone].

## 2015-02-01 NOTE — Telephone Encounter (Signed)
done

## 2015-02-18 ENCOUNTER — Emergency Department (HOSPITAL_COMMUNITY): Payer: Medicare Other

## 2015-02-18 ENCOUNTER — Encounter (HOSPITAL_COMMUNITY): Payer: Self-pay | Admitting: Emergency Medicine

## 2015-02-18 ENCOUNTER — Inpatient Hospital Stay (HOSPITAL_COMMUNITY)
Admission: EM | Admit: 2015-02-18 | Discharge: 2015-03-13 | DRG: 871 | Disposition: E | Payer: Medicare Other | Attending: Emergency Medicine | Admitting: Emergency Medicine

## 2015-02-18 DIAGNOSIS — Z89422 Acquired absence of other left toe(s): Secondary | ICD-10-CM

## 2015-02-18 DIAGNOSIS — Z888 Allergy status to other drugs, medicaments and biological substances status: Secondary | ICD-10-CM

## 2015-02-18 DIAGNOSIS — F329 Major depressive disorder, single episode, unspecified: Secondary | ICD-10-CM | POA: Diagnosis present

## 2015-02-18 DIAGNOSIS — Z885 Allergy status to narcotic agent status: Secondary | ICD-10-CM

## 2015-02-18 DIAGNOSIS — G629 Polyneuropathy, unspecified: Secondary | ICD-10-CM | POA: Diagnosis present

## 2015-02-18 DIAGNOSIS — G9340 Encephalopathy, unspecified: Secondary | ICD-10-CM | POA: Diagnosis present

## 2015-02-18 DIAGNOSIS — Z89412 Acquired absence of left great toe: Secondary | ICD-10-CM | POA: Diagnosis not present

## 2015-02-18 DIAGNOSIS — L539 Erythematous condition, unspecified: Secondary | ICD-10-CM | POA: Diagnosis present

## 2015-02-18 DIAGNOSIS — J189 Pneumonia, unspecified organism: Secondary | ICD-10-CM

## 2015-02-18 DIAGNOSIS — Z833 Family history of diabetes mellitus: Secondary | ICD-10-CM | POA: Diagnosis not present

## 2015-02-18 DIAGNOSIS — A419 Sepsis, unspecified organism: Secondary | ICD-10-CM | POA: Diagnosis present

## 2015-02-18 DIAGNOSIS — I129 Hypertensive chronic kidney disease with stage 1 through stage 4 chronic kidney disease, or unspecified chronic kidney disease: Secondary | ICD-10-CM | POA: Diagnosis present

## 2015-02-18 DIAGNOSIS — I10 Essential (primary) hypertension: Secondary | ICD-10-CM

## 2015-02-18 DIAGNOSIS — K219 Gastro-esophageal reflux disease without esophagitis: Secondary | ICD-10-CM | POA: Diagnosis present

## 2015-02-18 DIAGNOSIS — Z8679 Personal history of other diseases of the circulatory system: Secondary | ICD-10-CM | POA: Diagnosis not present

## 2015-02-18 DIAGNOSIS — E785 Hyperlipidemia, unspecified: Secondary | ICD-10-CM | POA: Diagnosis present

## 2015-02-18 DIAGNOSIS — C9000 Multiple myeloma not having achieved remission: Secondary | ICD-10-CM | POA: Diagnosis present

## 2015-02-18 DIAGNOSIS — F419 Anxiety disorder, unspecified: Secondary | ICD-10-CM | POA: Diagnosis present

## 2015-02-18 DIAGNOSIS — Z96651 Presence of right artificial knee joint: Secondary | ICD-10-CM | POA: Diagnosis present

## 2015-02-18 DIAGNOSIS — H409 Unspecified glaucoma: Secondary | ICD-10-CM | POA: Diagnosis present

## 2015-02-18 DIAGNOSIS — G8929 Other chronic pain: Secondary | ICD-10-CM | POA: Diagnosis present

## 2015-02-18 DIAGNOSIS — R6520 Severe sepsis without septic shock: Secondary | ICD-10-CM | POA: Diagnosis present

## 2015-02-18 DIAGNOSIS — M1A9XX1 Chronic gout, unspecified, with tophus (tophi): Secondary | ICD-10-CM | POA: Diagnosis present

## 2015-02-18 DIAGNOSIS — Z801 Family history of malignant neoplasm of trachea, bronchus and lung: Secondary | ICD-10-CM | POA: Diagnosis not present

## 2015-02-18 DIAGNOSIS — Z9852 Vasectomy status: Secondary | ICD-10-CM

## 2015-02-18 DIAGNOSIS — M48061 Spinal stenosis, lumbar region without neurogenic claudication: Secondary | ICD-10-CM | POA: Diagnosis present

## 2015-02-18 DIAGNOSIS — N400 Enlarged prostate without lower urinary tract symptoms: Secondary | ICD-10-CM | POA: Diagnosis present

## 2015-02-18 DIAGNOSIS — Z87891 Personal history of nicotine dependence: Secondary | ICD-10-CM | POA: Diagnosis not present

## 2015-02-18 DIAGNOSIS — M4806 Spinal stenosis, lumbar region: Secondary | ICD-10-CM | POA: Diagnosis present

## 2015-02-18 DIAGNOSIS — E039 Hypothyroidism, unspecified: Secondary | ICD-10-CM | POA: Diagnosis present

## 2015-02-18 DIAGNOSIS — E1122 Type 2 diabetes mellitus with diabetic chronic kidney disease: Secondary | ICD-10-CM | POA: Diagnosis present

## 2015-02-18 DIAGNOSIS — L03115 Cellulitis of right lower limb: Secondary | ICD-10-CM | POA: Diagnosis present

## 2015-02-18 DIAGNOSIS — I6529 Occlusion and stenosis of unspecified carotid artery: Secondary | ICD-10-CM | POA: Diagnosis present

## 2015-02-18 DIAGNOSIS — E1149 Type 2 diabetes mellitus with other diabetic neurological complication: Secondary | ICD-10-CM | POA: Diagnosis present

## 2015-02-18 DIAGNOSIS — Z79899 Other long term (current) drug therapy: Secondary | ICD-10-CM | POA: Diagnosis not present

## 2015-02-18 DIAGNOSIS — I469 Cardiac arrest, cause unspecified: Secondary | ICD-10-CM | POA: Diagnosis present

## 2015-02-18 DIAGNOSIS — Z8711 Personal history of peptic ulcer disease: Secondary | ICD-10-CM

## 2015-02-18 DIAGNOSIS — I959 Hypotension, unspecified: Secondary | ICD-10-CM | POA: Diagnosis present

## 2015-02-18 DIAGNOSIS — L03116 Cellulitis of left lower limb: Secondary | ICD-10-CM | POA: Diagnosis present

## 2015-02-18 DIAGNOSIS — M549 Dorsalgia, unspecified: Secondary | ICD-10-CM | POA: Diagnosis present

## 2015-02-18 DIAGNOSIS — E1129 Type 2 diabetes mellitus with other diabetic kidney complication: Secondary | ICD-10-CM | POA: Diagnosis present

## 2015-02-18 DIAGNOSIS — Z993 Dependence on wheelchair: Secondary | ICD-10-CM | POA: Diagnosis not present

## 2015-02-18 DIAGNOSIS — N184 Chronic kidney disease, stage 4 (severe): Secondary | ICD-10-CM | POA: Diagnosis present

## 2015-02-18 DIAGNOSIS — G4700 Insomnia, unspecified: Secondary | ICD-10-CM | POA: Diagnosis present

## 2015-02-18 DIAGNOSIS — M1990 Unspecified osteoarthritis, unspecified site: Secondary | ICD-10-CM | POA: Diagnosis present

## 2015-02-18 DIAGNOSIS — I739 Peripheral vascular disease, unspecified: Secondary | ICD-10-CM | POA: Diagnosis present

## 2015-02-18 DIAGNOSIS — N390 Urinary tract infection, site not specified: Secondary | ICD-10-CM | POA: Diagnosis present

## 2015-02-18 DIAGNOSIS — F1010 Alcohol abuse, uncomplicated: Secondary | ICD-10-CM | POA: Diagnosis present

## 2015-02-18 DIAGNOSIS — M1A00X1 Idiopathic chronic gout, unspecified site, with tophus (tophi): Secondary | ICD-10-CM | POA: Diagnosis present

## 2015-02-18 LAB — URINALYSIS, ROUTINE W REFLEX MICROSCOPIC
Bilirubin Urine: NEGATIVE
Glucose, UA: NEGATIVE mg/dL
Ketones, ur: NEGATIVE mg/dL
Nitrite: NEGATIVE
Protein, ur: 30 mg/dL — AB
Specific Gravity, Urine: 1.015 (ref 1.005–1.030)
Urobilinogen, UA: 0.2 mg/dL (ref 0.0–1.0)
pH: 7 (ref 5.0–8.0)

## 2015-02-18 LAB — COMPREHENSIVE METABOLIC PANEL
ALT: 13 U/L — ABNORMAL LOW (ref 17–63)
AST: 18 U/L (ref 15–41)
Albumin: 2.7 g/dL — ABNORMAL LOW (ref 3.5–5.0)
Alkaline Phosphatase: 86 U/L (ref 38–126)
Anion gap: 8 (ref 5–15)
BUN: 49 mg/dL — ABNORMAL HIGH (ref 6–20)
CO2: 23 mmol/L (ref 22–32)
Calcium: 8.5 mg/dL — ABNORMAL LOW (ref 8.9–10.3)
Chloride: 107 mmol/L (ref 101–111)
Creatinine, Ser: 2.62 mg/dL — ABNORMAL HIGH (ref 0.61–1.24)
GFR calc Af Amer: 24 mL/min — ABNORMAL LOW (ref >60)
GFR calc non Af Amer: 21 mL/min — ABNORMAL LOW (ref >60)
Glucose, Bld: 139 mg/dL — ABNORMAL HIGH (ref 65–99)
Potassium: 3.7 mmol/L (ref 3.5–5.1)
Sodium: 138 mmol/L (ref 135–145)
Total Bilirubin: 0.3 mg/dL (ref 0.3–1.2)
Total Protein: 5.9 g/dL — ABNORMAL LOW (ref 6.5–8.1)

## 2015-02-18 LAB — CBC WITH DIFFERENTIAL/PLATELET
Basophils Absolute: 0 10*3/uL (ref 0.0–0.1)
Basophils Relative: 0 % (ref 0–1)
Eosinophils Absolute: 0.1 10*3/uL (ref 0.0–0.7)
Eosinophils Relative: 1 % (ref 0–5)
HCT: 30.5 % — ABNORMAL LOW (ref 39.0–52.0)
Hemoglobin: 10 g/dL — ABNORMAL LOW (ref 13.0–17.0)
Lymphocytes Relative: 8 % — ABNORMAL LOW (ref 12–46)
Lymphs Abs: 1.1 10*3/uL (ref 0.7–4.0)
MCH: 31.6 pg (ref 26.0–34.0)
MCHC: 32.8 g/dL (ref 30.0–36.0)
MCV: 96.5 fL (ref 78.0–100.0)
Monocytes Absolute: 1.5 10*3/uL — ABNORMAL HIGH (ref 0.1–1.0)
Monocytes Relative: 11 % (ref 3–12)
Neutro Abs: 11.2 10*3/uL — ABNORMAL HIGH (ref 1.7–7.7)
Neutrophils Relative %: 80 % — ABNORMAL HIGH (ref 43–77)
Platelets: 186 10*3/uL (ref 150–400)
RBC: 3.16 MIL/uL — ABNORMAL LOW (ref 4.22–5.81)
RDW: 15.1 % (ref 11.5–15.5)
WBC: 13.9 10*3/uL — ABNORMAL HIGH (ref 4.0–10.5)

## 2015-02-18 LAB — I-STAT CG4 LACTIC ACID, ED
Lactic Acid, Venous: 1.02 mmol/L (ref 0.5–2.0)
Lactic Acid, Venous: 0.51 mmol/L (ref 0.5–2.0)

## 2015-02-18 LAB — URINE MICROSCOPIC-ADD ON

## 2015-02-18 LAB — GLUCOSE, CAPILLARY: GLUCOSE-CAPILLARY: 81 mg/dL (ref 65–99)

## 2015-02-18 MED ORDER — COLCHICINE 0.6 MG PO TABS
0.6000 mg | ORAL_TABLET | Freq: Every day | ORAL | Status: DC
  Filled 2015-02-18: qty 1

## 2015-02-18 MED ORDER — ACETAMINOPHEN 325 MG PO TABS
650.0000 mg | ORAL_TABLET | Freq: Once | ORAL | Status: AC
  Administered 2015-02-18: 650 mg via ORAL
  Filled 2015-02-18: qty 2

## 2015-02-18 MED ORDER — LEVOTHYROXINE SODIUM 25 MCG PO TABS
75.0000 ug | ORAL_TABLET | Freq: Every day | ORAL | Status: DC
  Filled 2015-02-18: qty 1

## 2015-02-18 MED ORDER — SODIUM CHLORIDE 0.9 % IV SOLN
2000.0000 mg | Freq: Once | INTRAVENOUS | Status: AC
  Administered 2015-02-18: 2000 mg via INTRAVENOUS
  Filled 2015-02-18: qty 2000

## 2015-02-18 MED ORDER — PAROXETINE HCL 20 MG PO TABS: 40.0000 mg | ORAL_TABLET | Freq: Every day | ORAL | Status: DC

## 2015-02-18 MED ORDER — SODIUM CHLORIDE 0.9 % IV BOLUS (SEPSIS)
2500.0000 mL | Freq: Once | INTRAVENOUS | Status: AC
  Administered 2015-02-18: 2500 mL via INTRAVENOUS

## 2015-02-18 MED ORDER — SODIUM CHLORIDE 0.9 % IV SOLN
INTRAVENOUS | Status: DC
  Administered 2015-02-18: 16:00:00 via INTRAVENOUS

## 2015-02-18 MED ORDER — DORZOLAMIDE HCL-TIMOLOL MAL 2-0.5 % OP SOLN
1.0000 [drp] | Freq: Two times a day (BID) | OPHTHALMIC | Status: DC
  Filled 2015-02-18: qty 10

## 2015-02-18 MED ORDER — ALPRAZOLAM 0.25 MG PO TABS
0.2500 mg | ORAL_TABLET | Freq: Every evening | ORAL | Status: DC | PRN
  Administered 2015-02-18: 0.25 mg via ORAL
  Filled 2015-02-18: qty 1

## 2015-02-18 MED ORDER — PIPERACILLIN-TAZOBACTAM 3.375 G IVPB 30 MIN
3.3750 g | Freq: Once | INTRAVENOUS | Status: AC
Start: 2015-02-18 — End: 2015-02-18
  Administered 2015-02-18: 3.375 g via INTRAVENOUS
  Filled 2015-02-18: qty 50

## 2015-02-18 MED ORDER — FEBUXOSTAT 40 MG PO TABS
40.0000 mg | ORAL_TABLET | Freq: Every day | ORAL | Status: DC
  Filled 2015-02-18: qty 1

## 2015-02-18 MED ORDER — INSULIN ASPART 100 UNIT/ML ~~LOC~~ SOLN: 0.0000 [IU] | Freq: Every day | SUBCUTANEOUS | Status: DC

## 2015-02-18 MED ORDER — PIPERACILLIN-TAZOBACTAM 3.375 G IVPB
3.3750 g | Freq: Three times a day (TID) | INTRAVENOUS | Status: DC
  Filled 2015-02-18 (×2): qty 50

## 2015-02-18 MED ORDER — PANTOPRAZOLE SODIUM 20 MG PO TBEC
20.0000 mg | DELAYED_RELEASE_TABLET | Freq: Two times a day (BID) | ORAL | Status: DC
  Administered 2015-02-18: 20 mg via ORAL
  Filled 2015-02-18 (×4): qty 1

## 2015-02-18 MED ORDER — NORTRIPTYLINE HCL 25 MG PO CAPS
50.0000 mg | ORAL_CAPSULE | Freq: Every day | ORAL | Status: DC
  Filled 2015-02-18 (×2): qty 2

## 2015-02-18 MED ORDER — POLYETHYLENE GLYCOL 3350 17 G PO PACK
17.0000 g | PACK | Freq: Two times a day (BID) | ORAL | Status: DC
  Filled 2015-02-18: qty 1

## 2015-02-18 MED ORDER — INSULIN ASPART 100 UNIT/ML ~~LOC~~ SOLN: 0.0000 [IU] | Freq: Three times a day (TID) | SUBCUTANEOUS | Status: DC

## 2015-02-18 MED ORDER — LATANOPROST 0.005 % OP SOLN
1.0000 [drp] | Freq: Every day | OPHTHALMIC | Status: DC
  Filled 2015-02-18: qty 2.5

## 2015-02-18 MED FILL — Medication: Qty: 1 | Status: AC

## 2015-02-20 LAB — URINE CULTURE: Culture: >=100000

## 2015-02-21 ENCOUNTER — Other Ambulatory Visit: Payer: Self-pay | Admitting: Family Medicine

## 2015-02-22 ENCOUNTER — Ambulatory Visit: Payer: Self-pay | Admitting: Family Medicine

## 2015-02-22 ENCOUNTER — Telehealth: Payer: Self-pay | Admitting: Family Medicine

## 2015-02-22 NOTE — Telephone Encounter (Signed)
Is this right?

## 2015-02-22 NOTE — Telephone Encounter (Signed)
Sympathy card sent 

## 2015-02-23 LAB — CULTURE, BLOOD (ROUTINE X 2)
Culture: NO GROWTH
Culture: NO GROWTH

## 2015-02-26 ENCOUNTER — Encounter (HOSPITAL_BASED_OUTPATIENT_CLINIC_OR_DEPARTMENT_OTHER): Payer: TRICARE For Life (TFL)

## 2015-03-13 NOTE — ED Notes (Signed)
Per EMS, wife called for SOB and weakness. Pt not complaining of anything except not feeling well, complaining of sacrum hurting. Told EMS he has a wound to his sacrum. Per EMS pt could not tell them what year it was, who the president was, or why he was going to the hospital. Wife states pt has never been diagnosed with dementia. Per EMS, their notes states he's wheelchair bound and prone to falls. EMS used sepsis protocol, with a systolic palpated pressure at 90 and a fever of 100.8 orally, they gave 500 ml bolus in route. Complaining of being thirsty in route.

## 2015-03-13 NOTE — Progress Notes (Signed)
Upon making patient rounds with coming nurse, patient was found unresponsive, code called and initiated, chest compression began, code team responded rapidly, code lasted for able 25 mins, patient pronounced and family contacted Neta Mends RN 2015/03/16 8:27pm

## 2015-03-13 NOTE — H&P (Addendum)
[facility] History and Physical  TREYLON HENARD QIO:[id number] DOB: 02/21/1928 DOA: 04-Mar-2015   PCP: Wyatt Haste, MD     HPI: Zachary Vargas is a 81 y.o. male who is mostly wheelchair-bound with diabetes mellitus, hypertension, gout, pedal edema and chronic kidney disease stage IV. According to notes in epic, his wife called EMS because of shortness of breath and weakness. Patient was slightly confused at that time. He was found to have a fever of 324.4 and a systolic blood pressure in the 90s. He was given a 500 mL bolus in route. On my exam he is more oriented and tells me that he started having chills this morning which is why EMS was called. He states that he felt fine yesterday. In the ER he was found to have a UA consistent with a UTI. He does not complain of any dysuria hematuria or suprapubic pain. On exam he is also found to have erythema in the sacral area with some flaking of skin and erythema and warmth of his right lower extremity. He has no complaints of pain in either area.  He has not had any nausea vomiting or diarrhea. No complaints of sore throat sinus drainage or shortness of breath.  He was last admitted in April with a UTI and bacteremia with Proteus.  General: The patient denies anorexia, fever, weight loss Cardiac: Denies chest pain, syncope, palpitations, pedal edema  Respiratory: Denies cough, shortness of breath, wheezing GI: Denies severe indigestion/heartburn, abdominal pain, nausea, vomiting, diarrhea and constipation GU: Denies hematuria, incontinence, dysuria  Musculoskeletal: Denies arthritis  Skin: Denies suspicious skin lesions Neurologic: Denies focal weakness or numbness, change in vision Psychiatry: Denies depression or anxiety. Hematologic: + Easy bruising / bleeding  All other systems reviewed and found to be negative.  Past Medical History  Diagnosis Date  . Glaucoma     uses Eye Drops daily  . Dyslipidemia     statin  intolerant  . Gout     takes Colchicine and Uloric daily  . BPH (benign prostatic hyperplasia)     [facility]  . Peptic ulcer disease   . Diverticulosis   . Lumbar spinal stenosis     Most recent MRI 12/ 2011; [facility] - Dr. Nelva Bush  . Carotid atherosclerosis     Dopplers 11/09 showing 50-69% stenosis  . Peripheral arterial disease     [facility] consult 1/08    Hospitalization 7/11 [facility]  . Arthritis     Followed by [facility]  . Myeloma     IgA related; Dr. Marin Olp - 2009    rarely    but doesn't take any meds  . Urinary frequency   . Anxiety     takes Paxil daily  . Insomnia     takes Ambien nightly   . Anemia     of chronic disease;takes Ferrous Gluconate daily  . Osteomyelitis   . Hypertension     takes Lisinopril daily  . Peripheral edema     takes Furosemide daily  . Pneumonia     hx of-about 51yr ago  . Peripheral neuropathy   . Chronic back pain     spinal stenosis  . Chronic kidney disease     not on dialysis  . Hypothyroidism     takes Synthroid daily  . GERD (gastroesophageal reflux disease)     takes Protonix daily  . Depression   . Diabetes mellitus     takes glipizide daily, Type 2  .  Cataract   . Substance abuse     alcohol    Past Surgical History  Procedure Laterality Date  . Abdominal aortic aneurysm repair  03/1998  . Abdominal hernia repair  2000  . Renal stone surgery  1999  . Nerve root block      Multiple for lumbar spine  . Colonoscopy  07/22/2004    due repeat 2011; Dr. Teena Irani, [facility]  . Eye surgery      bil cataract surgery  . Amputation Left 11/29/2012    Procedure: Left Foot, Great Toe Amputation at MTP Joint;  Surgeon: Newt Minion, MD;  Location: [facility];  Service: Orthopedics;  Laterality: Left;  LEFT  Foot, Great Toe Amputation at MTP Joint  . Amputation Left 01/03/2013    Procedure: Left foot 2nd toe amputation at metatarsophalangeal;  Surgeon: Newt Minion, MD;  Location: [facility];   Service: Orthopedics;  Laterality: Left;  . Tonsillectomy    . Vasectomy    . Amputation Left 03/19/2013    Procedure: AMPUTATION DIGIT;  Surgeon: Newt Minion, MD;  Location: [facility];  Service: Orthopedics;  Laterality: Left;  Left foot Amputation 3, 4, 5th Toes at MTP joint  . Esophagogastroduodenoscopy    . Total knee arthroplasty Right 10/15/2013    Procedure: TOTAL KNEE ARTHROPLASTY;  Surgeon: Newt Minion, MD;  Location: [facility];  Service: Orthopedics;  Laterality: Right;  Right Total Knee Arthroplasty  . Hernia repair N/A 2000    abdominal  . Colonoscopy N/A 12/01/2013    Procedure: COLONOSCOPY;  Surgeon: Jeryl Columbia, MD;  Location: [facility];  Service: Endoscopy;  Laterality: N/A;  . Esophagogastroduodenoscopy N/A 12/01/2013    Procedure: ESOPHAGOGASTRODUODENOSCOPY (EGD);  Surgeon: Jeryl Columbia, MD;  Location: [facility];  Service: Endoscopy;  Laterality: N/A;  . Esophagogastroduodenoscopy  12/01/2013    Social History: Social History   Social History  . Marital Status: Married    Spouse Name: N/A  . Number of Children: N/A  . Years of Education: N/A   Occupational History  . Not on file.   Social History Main Topics  . Smoking status: Former Research scientist (life sciences)  . Smokeless tobacco: Never Used     Comment: quit smoking 26yr ago  . Alcohol Use: Yes     Comment: 4 oz drink daily  . Drug Use: No  . Sexual Activity: No     Comment: unhappy in his marriage   Other Topics Concern  . Not on file   Social History Narrative   Lives at home with wife. Able to transfer from bed to wheelchair   Allergies  Allergen Reactions  . Morphine And Related Hives and Shortness Of Breath  . Statins Hives    Pt notes that "all" classes of cholesterol meds do the same   . Allopurinol Hives, Swelling and Rash    Family history:   Family History  Problem Relation Age of Onset  . Diabetes Father   . Glaucoma Father   . Cancer Brother     1 died of lung cancer, 1 of colon, 1 of different  cancer  . Glaucoma Daughter   . Glaucoma Son       Prior to Admission medications   Medication Sig Start Date End Date Taking? Authorizing Provider  ALPRAZolam (XANAX) 0.25 MG tablet Take 1 tablet (0.25 mg total) by mouth at bedtime as needed for anxiety. 09/25/14  Yes JDenita Lung MD  Blood Glucose Monitoring Suppl (FREESTYLE  FREEDOM LITE) W/DEVICE KIT 1 each by Does not apply route 2 (two) times daily. Patient test bid DX:E11.9 06/15/14  Yes Denita Lung, MD  cholecalciferol (VITAMIN D) 1000 UNITS tablet Take 1,000 Units by mouth daily.   Yes Historical Provider, MD  colchicine 0.6 MG tablet Take 0.6 mg by mouth daily.   Yes Historical Provider, MD  dorzolamide-timolol (COSOPT) 22.3-6.8 MG/ML ophthalmic solution Place 1 drop into both eyes 2 (two) times daily.    Yes Historical Provider, MD  febuxostat (ULORIC) 40 MG tablet Take 40 mg by mouth daily.   Yes Historical Provider, MD  ferrous gluconate (FERGON) 325 MG tablet Take 325 mg by mouth daily with breakfast.     Yes Historical Provider, MD  furosemide (LASIX) 20 MG tablet 1 daily as needed Patient taking differently: Take 20 mg by mouth daily as needed for fluid or edema.  09/28/14  Yes Denita Lung, MD  glucose blood (FREESTYLE LITE) test strip Patient is to test bid DX: E11.9Use as instructed 05/18/14  Yes Denita Lung, MD  hydrOXYzine (ATARAX/VISTARIL) 25 MG tablet Take 1 tablet (25 mg total) by mouth 3 (three) times daily as needed. Patient taking differently: Take 25 mg by mouth 3 (three) times daily as needed for anxiety or itching.  02/01/15  Yes Denita Lung, MD  latanoprost (XALATAN) 0.005 % ophthalmic solution Place 1 drop into the right eye at bedtime.     Yes Historical Provider, MD  levothyroxine (SYNTHROID, LEVOTHROID) 75 MCG tablet Take 1 tablet (75 mcg total) by mouth daily before breakfast. 04/20/14  Yes Denita Lung, MD  lisinopril (PRINIVIL,ZESTRIL) 10 MG tablet Take 1 tablet (10 mg total) by mouth daily.  Patient is to take one tablet 09/23/14  Yes Delfina Redwood, MD  loratadine (CLARITIN) 10 MG tablet Take 10 mg by mouth daily as needed for allergies.    Yes Historical Provider, MD  Multiple Vitamins-Minerals (MULTIVITAMIN WITH MINERALS) tablet Take 1 tablet by mouth daily.   Yes Historical Provider, MD  nortriptyline (PAMELOR) 50 MG capsule Take 1 capsule (50 mg total) by mouth at bedtime. 05/12/14  Yes Denita Lung, MD  Omega-3 Fatty Acids (FISH OIL) 1000 MG CAPS Take 1,000 mg by mouth daily.   Yes Historical Provider, MD  pantoprazole (PROTONIX) 20 MG tablet Take 1 tablet (20 mg total) by mouth 2 (two) times daily before a meal. 12/01/13  Yes Costin Karlyne Greenspan, MD  PARoxetine (PAXIL) 40 MG tablet Take 1 tablet (40 mg total) by mouth every morning. 12/25/14  Yes Camelia Eng Tysinger, PA-C  polyethylene glycol (MIRALAX / GLYCOLAX) packet Take 17 g by mouth 2 (two) times daily. 02/01/14  Yes Christina P Rama, MD  cephALEXin (KEFLEX) 500 MG capsule Take 1 capsule (500 mg total) by mouth 2 (two) times daily. Patient not taking: Reported on February 27, 2015 09/23/14   Delfina Redwood, MD     Physical Exam: Filed Vitals:   February 27, 2015 1300 02-27-2015 1320 2015-02-27 1330 02/27/2015 1400  BP: 106/51  115/47 116/52  Pulse: 72  75 75  Temp:  97.9 F (36.6 C)    TempSrc:  Oral    Resp: _0 Height:      Weight:      SpO2: 99%  92% 100%     General: Elderly male laying in bed in no acute distress HEENT: Normocephalic and Atraumatic, Mucous membranes pink  PERRLA; EOM intact; No scleral icterus,                 Nares: Patent, Oropharynx: Clear, Fair Dentition                 Neck: FROM, no cervical lymphadenopathy, thyromegaly, carotid bruit or JVD;  Breasts: deferred CHEST WALL: No tenderness  CHEST: Normal respiration, clear to auscultation bilaterally  HEART: Regular rate and rhythm; 1/6 systolic murmurs at right upper sternal border radiating to his carotids- no rubs or gallops  BACK:  No kyphosis or scoliosis; no CVA tenderness  GI: Positive Bowel Sounds, soft, non-tender; no masses, no organomegaly Rectal Exam: deferred MSK: No cyanosis, clubbing, or edema Genitalia: not examined  SKIN:  Erythema and sacral area with mild warmth, erythema on left leg with increased warmth-no tenderness-numerous amount of scabs in the leg area-numerous bruises throughout body   CNS: Alert and Oriented - he knows where he is and the year, thinks the month is October, Nonfocal exam, CN 2-12 intact  Labs on Admission:  Basic Metabolic Panel:  Recent Labs Lab 19-Mar-2015 1122  NA 138  K 3.7  CL 107  CO2 23  GLUCOSE 139*  BUN 49*  CREATININE 2.62*  CALCIUM 8.5*   Liver Function Tests:  Recent Labs Lab 2015/03/19 1122  AST 18  ALT 13*  ALKPHOS 86  BILITOT 0.3  PROT 5.9*  ALBUMIN 2.7*   No results for input(s): LIPASE, AMYLASE in the last 168 hours. No results for input(s): AMMONIA in the last 168 hours. CBC:  Recent Labs Lab March 19, 2015 1122  WBC 13.9*  NEUTROABS 11.2*  HGB 10.0*  HCT 30.5*  MCV 96.5  PLT 186   Cardiac Enzymes: No results for input(s): CKTOTAL, CKMB, CKMBINDEX, TROPONINI in the last 168 hours.  BNP (last 3 results) No results for input(s): BNP in the last 8760 hours.  ProBNP (last 3 results) No results for input(s): PROBNP in the last 8760 hours.  CBG: No results for input(s): GLUCAP in the last 168 hours.  Radiological Exams on Admission: Dg Chest 2 View  03/19/2015   CLINICAL DATA:  Fever, shortness of breath.  EXAM: CHEST  2 VIEW  COMPARISON:  September 20, 2014.  FINDINGS: Stable cardiomegaly. No pneumothorax is noted. No significant pleural effusion is noted. Stable interstitial densities are noted throughout both lungs concerning for scarring. Increased airspace opacity is noted laterally in the right upper lobe concerning for superimposed inflammation or pneumonia. Degenerative changes seen involving the right glenohumeral joint.  IMPRESSION:  Stable interstitial densities are noted throughout both lungs consistent with scarring. New superimposed opacity seen laterally in right upper lobe concerning for possible pneumonia ; followup radiographs are recommended.   Electronically Signed   By: Marijo Conception, M.D.   On: 2015/03/19 12:26    EKG: Independently reviewed. 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 -does not appear to be on medication for this at  home - Place-on low-dose sliding scale for now    Hypothyroidism --Continue Synthroid    Chronic gouty arthropathy with tophus (tophi) -Continue colchicine and Uloric    Hypotension with history of Essential hypertension -Presenting with hypotension-hold lisinopril    Chronic kidney disease, stage IV (severe) -Creatinine appears to be at/near baseline   Code Status: Full code per patient's request Family Communication:   DVT Prophylaxis: Lovenox  Time spent: 61 minutes  Siesta Key, MD [facility]  If 7PM-7AM, please contact night-coverage [email or web address] 03/03/15, 2:38 PM

## 2015-03-13 NOTE — ED Provider Notes (Signed)
CSN: [id number]     Arrival date & time 2015/03/03  1034 History   First MD Initiated Contact with Patient 03/03/2015 1043     Chief Complaint  Patient presents with  . Hypotension  . Fever  . Weakness     (Consider location/radiation/quality/duration/timing/severity/associated sxs/prior Treatment) HPI   87yM with generalized weakness and buttock pain. Pt reports he is mostly wheelchair bound. Today he got up and had increased pain in buttocks and felt very weak. Asked his wife to have him be evaluated. Noted to be febrile, but pt denies subjective fever to me. Some SOB. No cough. No n/v. Hypotensive for EMS and received 500cc NS prehospital.   Past Medical History  Diagnosis Date  . Glaucoma     uses Eye Drops daily  . Dyslipidemia     statin intolerant  . Gout     takes Colchicine and Uloric daily  . BPH (benign prostatic hyperplasia)     [facility]  . History of peptic ulcer disease   . Peptic ulcer disease   . Diverticulosis   . Lumbar spinal stenosis     Most recent MRI 12/ 2011; [facility] - Dr. Nelva Bush  . Carotid atherosclerosis     Dopplers 11/09 showing 50-69% stenosis  . Peripheral arterial disease     [facility] consult 1/08  . Epigastric pain     Hospitalization 7/11 [place]  . Arthritis     Followed by [facility]  . Myeloma     IgA related; Dr. Marin Olp - 2009  . Headache(784.0)     rarely  . Constipation     but doesn't take any meds  . Urinary frequency   . Anxiety     takes Paxil daily  . Insomnia     takes Ambien nightly   . Anemia     of chronic disease;takes Ferrous Gluconate daily  . Osteomyelitis   . Hypertension     takes Lisinopril daily  . Peripheral edema     takes Furosemide daily  . Pneumonia     hx of-about 23yr ago  . Peripheral neuropathy   . Joint pain   . Joint swelling   . Chronic back pain     spinal stenosis  . Chronic kidney disease     not on dialysis  . Hypothyroidism     takes Synthroid daily  .  GERD (gastroesophageal reflux disease)     takes Protonix daily  . Depression   . Gout   . Diabetes mellitus     takes glipizide daily, Type 2  . Cataract   . Substance abuse     alcohol   Past Surgical History  Procedure Laterality Date  . Abdominal aortic aneurysm repair  03/1998  . Abdominal hernia repair  2000  . Renal stone surgery  1999  . Nerve root block      Multiple for lumbar spine  . Colonoscopy  07/22/2004    due repeat 2011[facility]  . Eye surgery      bil cataract surgery  . Amputation Left 11/29/2012    Procedure: Left Foot, Great Toe Amputation at MTP Joint;  Surgeon: MNewt Minion MD;  [place] [facility]  Service: Orthopedics;  Laterality: Left;  LEFT  Foot, Great Toe Amputation at MTP Joint  . Amputation Left 01/03/2013    Procedure: Left foot 2nd toe amputation at metatarsophalangeal;  Surgeon: MNewt Minion MD;  [place] [facility]  Service: Orthopedics;  Laterality: Left;  . Tonsillectomy    . Vasectomy    . Amputation Left 03/19/2013    Procedure: AMPUTATION DIGIT;  Surgeon: Newt Minion, MD;  Location: [facility];  Service: Orthopedics;  Laterality: Left;  Left foot Amputation 3, 4, 5th Toes at MTP joint  . Esophagogastroduodenoscopy    . Total knee arthroplasty Right 10/15/2013    Procedure: TOTAL KNEE ARTHROPLASTY;  Surgeon: Newt Minion, MD;  Location: [place];  Service: Orthopedics;  Laterality: Right;  Right Total Knee Arthroplasty  . Hernia repair N/A 2000    abdominal  . Colonoscopy N/A 12/01/2013    Procedure: COLONOSCOPY;  Surgeon: Jeryl Columbia, MD;  Location: [facility];  Service: Endoscopy;  Laterality: N/A;  . Esophagogastroduodenoscopy N/A 12/01/2013    Procedure: ESOPHAGOGASTRODUODENOSCOPY (EGD);  Surgeon: Jeryl Columbia, MD;  Location: [facility];  Service: Endoscopy;  Laterality: N/A;  . Esophagogastroduodenoscopy  12/01/2013   Family History  Problem Relation Age of Onset  . Diabetes Father   . Glaucoma Father   . Cancer  Brother     1 died of lung cancer, 1 of colon, 1 of different cancer  . Glaucoma Daughter   . Glaucoma Son    Social History  Substance Use Topics  . Smoking status: Former Research scientist (life sciences)  . Smokeless tobacco: Never Used     Comment: quit smoking 6yr ago  . Alcohol Use: Yes     Comment: 4 oz drink daily    Review of Systems  All systems reviewed and negative, other than as noted in HPI.   Allergies  Morphine and related; Statins; and Allopurinol  Home Medications   Prior to Admission medications   Medication Sig Start Date End Date Taking? Authorizing Provider  ALPRAZolam (XANAX) 0.25 MG tablet Take 1 tablet (0.25 mg total) by mouth at bedtime as needed for anxiety. 09/25/14   JDenita Lung MD  Blood Glucose Monitoring Suppl (FREESTYLE FREEDOM LITE) W/DEVICE KIT 1 each by Does not apply route 2 (two) times daily. Patient test bid DX:E11.9 06/15/14   JDenita Lung MD  cephALEXin (KEFLEX) 500 MG capsule Take 1 capsule (500 mg total) by mouth 2 (two) times daily. 09/23/14   CDelfina Redwood MD  cholecalciferol (VITAMIN D) 1000 UNITS tablet Take 1,000 Units by mouth daily.    Historical Provider, MD  colchicine 0.6 MG tablet Take 0.6 mg by mouth daily.    Historical Provider, MD  dorzolamide-timolol (COSOPT) 22.3-6.8 MG/ML ophthalmic solution Place 1 drop into both eyes 2 (two) times daily.     Historical Provider, MD  febuxostat (ULORIC) 40 MG tablet Take 40 mg by mouth daily.    Historical Provider, MD  ferrous gluconate (FERGON) 325 MG tablet Take 325 mg by mouth daily with breakfast.      Historical Provider, MD  furosemide (LASIX) 20 MG tablet 1 daily as needed 09/28/14   JDenita Lung MD  glucose blood (FREESTYLE LITE) test strip Patient is to test bid DX: E11.9Use as instructed 05/18/14   JDenita Lung MD  hydrOXYzine (ATARAX/VISTARIL) 25 MG tablet Take 1 tablet (25 mg total) by mouth 3 (three) times daily as needed. 02/01/15   JDenita Lung MD  latanoprost (XALATAN) 0.005 %  ophthalmic solution Place 1 drop into the right eye at bedtime.      Historical Provider, MD  levothyroxine (SYNTHROID, LEVOTHROID) 75 MCG tablet Take 1 tablet (75 mcg total) by mouth daily before breakfast. 04/20/14  Denita Lung, MD  lisinopril (PRINIVIL,ZESTRIL) 10 MG tablet Take 1 tablet (10 mg total) by mouth daily. Patient is to take one tablet 09/23/14   Delfina Redwood, MD  loratadine (CLARITIN) 10 MG tablet Take 10 mg by mouth daily.    Historical Provider, MD  Multiple Vitamins-Minerals (MULTIVITAMIN WITH MINERALS) tablet Take 1 tablet by mouth daily.    Historical Provider, MD  nortriptyline (PAMELOR) 50 MG capsule Take 1 capsule (50 mg total) by mouth at bedtime. 05/12/14   Denita Lung, MD  Omega-3 Fatty Acids (FISH OIL) 1000 MG CAPS Take 1,000 mg by mouth daily.    Historical Provider, MD  pantoprazole (PROTONIX) 20 MG tablet Take 1 tablet (20 mg total) by mouth 2 (two) times daily before a meal. 12/01/13   Costin Karlyne Greenspan, MD  PARoxetine (PAXIL) 40 MG tablet Take 1 tablet (40 mg total) by mouth every morning. 12/25/14   Camelia Eng Tysinger, PA-C  polyethylene glycol (MIRALAX / GLYCOLAX) packet Take 17 g by mouth 2 (two) times daily. 02/01/14   Venetia Maxon Rama, MD   BP 99/47 mmHg  Pulse 87  Temp(Src) 100.5 F (38.1 C) (Rectal)  Resp 20  Ht 6' 3"  (1.905 m)  Wt 240 lb (108.863 kg)  BMI 30.00 kg/m2  SpO2 96% Physical Exam  Constitutional: He appears well-developed and well-nourished. No distress.  HENT:  Head: Normocephalic and atraumatic.  Eyes: Conjunctivae are normal. Right eye exhibits no discharge. Left eye exhibits no discharge.  Neck: Neck supple.  Cardiovascular: Normal rate, regular rhythm and normal heart sounds.  Exam reveals no gallop and no friction rub.   No murmur heard. Pulmonary/Chest: Effort normal and breath sounds normal. No respiratory distress.  Abdominal: Soft. He exhibits no distension. There is no tenderness.  Genitourinary:  Erythema over sacrum  with superficial flaky/sloughing.   Musculoskeletal: He exhibits no edema or tenderness.  Neurological: He is alert.  Skin: Skin is warm and dry.  Psychiatric: He has a normal mood and affect. His behavior is normal. Thought content normal.  Nursing note and vitals reviewed.   ED Course  Procedures (including critical care time) Labs Review Labs Reviewed  COMPREHENSIVE METABOLIC PANEL - Abnormal; Notable for the following:    Glucose, Bld 139 (*)    BUN 49 (*)    Creatinine, Ser 2.62 (*)    Calcium 8.5 (*)    Total Protein 5.9 (*)    Albumin 2.7 (*)    ALT 13 (*)    GFR calc non Af [name] 21 (*)    GFR calc Af [name] 24 (*)    All other components within normal limits  CBC WITH DIFFERENTIAL/PLATELET - Abnormal; Notable for the following:    WBC 13.9 (*)    RBC 3.16 (*)    Hemoglobin 10.0 (*)    HCT 30.5 (*)    Neutrophils Relative % 80 (*)    Lymphocytes Relative 8 (*)    Neutro Abs 11.2 (*)    Monocytes Absolute 1.5 (*)    All other components within normal limits  URINALYSIS, ROUTINE W REFLEX MICROSCOPIC (NOT AT [facility]) - Abnormal; Notable for the following:    Hgb urine dipstick SMALL (*)    Protein, ur 30 (*)    Leukocytes, UA MODERATE (*)    All other components within normal limits  URINE MICROSCOPIC-ADD ON - Abnormal; Notable for the following:    Bacteria, UA FEW (*)    Casts HYALINE CASTS (*)  All other components within normal limits  CULTURE, BLOOD (ROUTINE X 2)  CULTURE, BLOOD (ROUTINE X 2)  URINE CULTURE  I-STAT CG4 LACTIC ACID, ED    Imaging Review Dg Chest 2 View  12-Mar-2015   CLINICAL DATA:  Fever, shortness of breath.  EXAM: CHEST  2 VIEW  COMPARISON:  September 20, 2014.  FINDINGS: Stable cardiomegaly. No pneumothorax is noted. No significant pleural effusion is noted. Stable interstitial densities are noted throughout both lungs concerning for scarring. Increased airspace opacity is noted laterally in the right upper lobe concerning for superimposed  inflammation or pneumonia. Degenerative changes seen involving the right glenohumeral joint.  IMPRESSION: Stable interstitial densities are noted throughout both lungs consistent with scarring. New superimposed opacity seen laterally in right upper lobe concerning for possible pneumonia ; followup radiographs are recommended.   Electronically Signed   By: Marijo Conception, M.D.   On: 03/12/2015 12:26   I have personally reviewed and evaluated these images and lab results as part of my medical decision-making.   EKG Interpretation   Date/Time:  03-12-15 10:44:26 EDT Ventricular Rate:  82 PR Interval:  242 QRS Duration: 113 QT Interval:  386 QTC Calculation: 451 R Axis:   56 Text Interpretation:  Sinus rhythm Prolonged PR interval Borderline  intraventricular conduction delay Low voltage, precordial leads Confirmed  by Orlander Norwood  MD, Gaylynn Seiple ([id number]) on 03-12-15 12:49:47 PM      MDM   Final diagnoses:  Severe sepsis  UTI (lower urinary tract infection)  Community acquired pneumonia    87yM with possible sepsis. Febrile. Hypotensive. BP responding to IVF. Empiric abx. Source not completely clear at this time. Some mild skin breakdown over sacrum, but no obvious cellulitis. Will check UA, blood work including cultures, CXR.  Likely UTI. Possible pneumonia. Previously ordered vanc/zosyn should adequately cover. Lactic acid normal. BP responding to IVF. Renal impairment, but CKD III and close to baseline.     Virgel Manifold, MD 02/19/15 [phone]

## 2015-03-13 NOTE — Progress Notes (Signed)
Pt pronounced by Park Breed RN and Kandra Nicolas RN. No spontaneous respirations, no rhytmn on monitor, no heart sounds heard.

## 2015-03-13 NOTE — Progress Notes (Signed)
PCCM INTERVAL PROGRESS NOTE  81 year old male admitted with sepsis presumably from UTI or cellulitis per chart review.   Overheard code blue alarm from one floor above me called for 1324. I responded to assist the code team in any way possible. Upon arrival to the room compressions were already in progress, and had been for about 10 minutes. I was advised that initial rhythm had been asystole, and that had not changed. Several doses of epinephrine had been given prior to my arrival. Code status and attending physician were unclear based on the EPIC headline profile. ACLS protocol was pursued for approximately 30 minutes, all of which with underlying rhythm of asystole. Wife was on phone with triad hospitalist Dr. Porfirio Mylar. She requested that additional resuscitative efforts be stopped. Code was called. See Code documentation sheet for more detailed description of events. D/w Dr. Lake Bells of PCCM.  Georgann Housekeeper, AGACNP-BC [facility] Pulmonology/Critical Care Pager [phone] or [phone]  03/19/2015 8:00 PM

## 2015-03-13 NOTE — ED Notes (Signed)
Patient's person to contact/woman living with him:   Letta Median  [phone]   She stated patient has recently been coming off alcohol. Will let receiving nurse know.

## 2015-03-13 NOTE — Code Documentation (Signed)
  Patient Name: Zachary Vargas   MRN: [id number]   Date of Birth/ Sex: March 31, 1928 , male      Admission Date: 03-15-15  Attending Provider: No att. providers found  Primary Diagnosis: UTI (lower urinary tract infection) [N39.0] Community acquired pneumonia [J18.9] Severe sepsis [A41.9, R65.20]   Indication:  Pt was admitted due to sepsis secondary to UTI and leg cellulitis today. He has been treated with Abx and IVF. At about 19:23, he was noticed to be unresponsive and was in asystole.  Code blue was subsequently called. When I arrived to the floor, PCCM PA, Georgann Housekeeper was running code. ACLS protocol was underway. I did not run code by myself. I spoke with patient's wife, Mrs Hendon by phone, who clearly told me that pt had discussed his wishes with both his wife and his daughter in the past. Patient did not want to have life support or aggressive management in such situation.   Technical Description:  - CPR performance duration:  About 18  minutes  - Was defibrillation or cardioversion used? No   - Was external pacer placed? Yes  - Was patient intubated pre/post CPR? Yes   Medications Administered: Y = Yes; Blank = No Amiodarone    Atropine    Calcium    Epinephrine  y  Lidocaine    Magnesium    Norepinephrine    Phenylephrine    Sodium bicarbonate  y  Vasopressin    Other    Post CPR evaluation:  - Final Status - Was patient successfully resuscitated ? No   Miscellaneous Information:  - Time of death:  19:41 PM  - Primary team notified?  Yes  - Family Notified? Yes.     Ivor Costa, MD   Mar 15, 2015, 7:58 PM

## 2015-03-13 NOTE — ED Notes (Signed)
Bed: RE32 Expected date:  Expected time:  Means of arrival:  Comments: EMS- 81yo M, AMS/Septic?

## 2015-03-13 NOTE — Progress Notes (Signed)
Pharmacy is asked to add Zosyn to Vanc for sepsis. The first dose of Zosyn was given in the ED. Renal function is poor but CrCl is >20ml/min so he qualifies for using extended-infusion Zosyn 3.375g IV Q8H infused over 4hrs.  Romeo Rabon, PharmD, pager [phone]. 22-Feb-2015,3:23 PM.

## 2015-03-13 NOTE — Progress Notes (Signed)
Cardiac Monitoring order was a order that was from when the patient was in the ER and not an admission order; order was to be dc'd per Floria Raveling, RN per Dr Wilson Singer but was not entered into the computer; Order was dc'd at 2015 when it was noted that order had not been placed to dc when pt left the ER

## 2015-03-13 NOTE — Progress Notes (Addendum)
ANTIBIOTIC CONSULT NOTE - INITIAL  Pharmacy Consult for vancomycin  Indication: rule out sepsis  Allergies  Allergen Reactions  . Morphine And Related Hives and Shortness Of Breath  . Statins Hives    Pt notes that "all" classes of cholesterol meds do the same   . Allopurinol Hives, Swelling and Rash    Patient Measurements: Height: 6\' 3"  (190.5 cm) Weight: 240 lb (108.863 kg) IBW/kg (Calculated) : 84.5 *  Vital Signs: Temp: 100.5 F (38.1 C) (09/08 1050) Temp Source: Rectal (09/08 1050) BP: 99/47 mmHg (09/08 1048) Pulse Rate: 87 (09/08 1048) Intake/Output from previous day:   Intake/Output from this shift:    Labs: No results for input(s): WBC, HGB, PLT, LABCREA, CREATININE in the last 72 hours. CrCl cannot be calculated (Patient has no serum creatinine result on file.). No results for input(s): VANCOTROUGH, VANCOPEAK, VANCORANDOM, GENTTROUGH, GENTPEAK, GENTRANDOM, TOBRATROUGH, TOBRAPEAK, TOBRARND, AMIKACINPEAK, AMIKACINTROU, AMIKACIN in the last 72 hours.   Microbiology: No results found for this or any previous visit (from the past 720 hour(s)).  Medical History: Past Medical History  Diagnosis Date  . Glaucoma     uses Eye Drops daily  . Dyslipidemia     statin intolerant  . Gout     takes Colchicine and Uloric daily  . BPH (benign prostatic hyperplasia)     [facility]  . History of peptic ulcer disease   . Peptic ulcer disease   . Diverticulosis   . Lumbar spinal stenosis     Most recent MRI 12/ 2011; [facility] - Dr. Nelva Bush  . Carotid atherosclerosis     Dopplers 11/09 showing 50-69% stenosis  . Peripheral arterial disease     [facility] consult 1/08  . Epigastric pain     Hospitalization 7/11 [place]  . Arthritis     [facility]  . Myeloma     IgA related; Dr. Marin Olp - 2009  . Headache(784.0)     rarely  . Constipation     but doesn't take any meds  . Urinary frequency   . Anxiety     takes Paxil daily  .  Insomnia     takes Ambien nightly   . Anemia     of chronic disease;takes Ferrous Gluconate daily  . Osteomyelitis   . Hypertension     takes Lisinopril daily  . Peripheral edema     takes Furosemide daily  . Pneumonia     hx of-about 38yrs ago  . Peripheral neuropathy   . Joint pain   . Joint swelling   . Chronic back pain     spinal stenosis  . Chronic kidney disease     not on dialysis  . Hypothyroidism     takes Synthroid daily  . GERD (gastroesophageal reflux disease)     takes Protonix daily  . Depression   . Gout   . Diabetes mellitus     takes glipizide daily, Type 2  . Cataract   . Substance abuse     alcohol    Assessment: Patient's an 81 y.o M with hx CKD stage 3 who presented to the ED on 9/8 with c/o SOB, sacrum wound pain, and weakness. He was found to be hypotensive and febrile.  To start abx for suspected sepsis.    Goal of Therapy:  Vancomycin trough level 15-20 mcg/ml  Plan:  - vancomycin 2gm IV x1 load. Will f/u with labs and enter maintenance regimen for vancomycin - zosyn 3.375  gm IV x1 per MD  Kanon Colunga P 14-Mar-2015,11:39 AM  Adden:  Scr now back as 2.62 (crcl ~20 N). With renal insuff., will check  vancomycin level in the next couple of days and re-dose if level is <20 Dia Sitter, PharmD, BCPS Mar 14, 2015 12:53 PM

## 2015-03-13 NOTE — Discharge Summary (Signed)
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 Below is my H and P which was done the last time I had contact with him.    [facility] History and Physical  RAHUL MALINAK JJH:[id number] DOB: 1927/06/25 DOA: 02-26-15   PCP: Wyatt Haste, MD     HPI: Zachary Vargas is a 81 y.o. male who is mostly wheelchair-bound with diabetes mellitus, hypertension, gout, pedal edema and chronic kidney disease stage IV. According to notes in epic, his wife called EMS because of shortness of breath and weakness. Patient was slightly confused at that time. He was found to have a fever of 818.5 and a systolic blood pressure in the 90s. He was given a 500 mL bolus in route. On my exam he is more oriented and tells me that he started having chills this morning which is why EMS was called. He states that he felt fine yesterday. In the ER he was found to have a UA consistent with a UTI. He does not complain of any dysuria hematuria or suprapubic pain. On exam he is also found to have erythema in the sacral area with some flaking of skin and erythema and warmth of his right lower extremity. He has no complaints of pain in either area.  He has not had any nausea vomiting or diarrhea. No complaints of sore throat sinus drainage or shortness of breath.  He was last admitted in April with a UTI and  bacteremia with Proteus.  General: The patient denies anorexia, fever, weight loss Cardiac: Denies chest pain, syncope, palpitations, pedal edema  Respiratory: Denies cough, shortness of breath, wheezing GI: Denies severe indigestion/heartburn, abdominal pain, nausea, vomiting, diarrhea and constipation GU: Denies hematuria, incontinence, dysuria  Musculoskeletal: Denies arthritis  Skin: Denies suspicious skin lesions Neurologic: Denies focal weakness or numbness, change in vision Psychiatry: Denies depression or anxiety. Hematologic: + Easy bruising / bleeding  All other systems reviewed and found to be negative.  Past Medical History  Diagnosis Date  . Glaucoma     uses Eye Drops daily  . Dyslipidemia     statin intolerant  . Gout     takes Colchicine and Uloric daily  . BPH (benign prostatic hyperplasia)     [facility]  . Peptic ulcer disease   . Diverticulosis   . Lumbar spinal stenosis     Most recent MRI 12/ 2011; [facility] - Dr. Nelva Bush  . Carotid atherosclerosis     Dopplers 11/09 showing 50-69% stenosis  . Peripheral arterial disease     [place] health care consult 1/08    Hospitalization 7/11 [place]  . Arthritis     Followed by [facility]  . Myeloma     IgA related; Dr. Marin Olp - 2009    rarely    but doesn't take any meds  . Urinary frequency   . Anxiety  takes Paxil daily  . Insomnia     takes Ambien nightly   . Anemia     of chronic disease;takes Ferrous Gluconate daily  . Osteomyelitis   . Hypertension     takes Lisinopril daily  . Peripheral edema     takes Furosemide daily  . Pneumonia     hx of-about 36yr ago  . Peripheral neuropathy   . Chronic back pain     spinal stenosis  . Chronic kidney disease     not on dialysis  . Hypothyroidism     takes Synthroid daily  . GERD  (gastroesophageal reflux disease)     takes Protonix daily  . Depression   . Diabetes mellitus     takes glipizide daily, Type 2  . Cataract   . Substance abuse     alcohol    Past Surgical History  Procedure Laterality Date  . Abdominal aortic aneurysm repair  03/1998  . Abdominal hernia repair  2000  . Renal stone surgery  1999  . Nerve root block      Multiple for lumbar spine  . Colonoscopy  07/22/2004    [facility]  . Eye surgery      bil cataract surgery  . Amputation Left 11/29/2012    Procedure: Left Foot, Great Toe Amputation at MTP Joint; Surgeon: MNewt Minion MD; [place] [facility]: [place]opedics; Laterality: Left; LEFT Foot, Great Toe Amputation at MTP Joint  . Amputation Left 01/03/2013    Procedure: Left foot 2nd toe amputation at metatarsophalangeal; Surgeon: MNewt Minion MD; [place] [facility]: [facility]; Laterality: Left;  . Tonsillectomy    . Vasectomy    . Amputation Left 03/19/2013    Procedure: AMPUTATION DIGIT; Surgeon: MNewt Minion MD; [place] [facility] [place]; Laterality: Left; Left foot Amputation 3, 4, 5th Toes at MTP joint  . Esophagogastroduodenoscopy    . Total knee arthroplasty Right 10/15/2013    Procedure: TOTAL KNEE ARTHROPLASTY; Surgeon: MNewt Minion MD; [facility] Service: [facility]opedics; Laterality: Right; Right Total Knee Arthroplasty  . Hernia repair N/A 2000    abdominal  . Colonoscopy N/A 12/01/2013    Procedure: COLONOSCOPY; Surgeon: MJeryl Columbia MD; [facility]; Service: Endoscopy; Laterality: N/A;  . Esophagogastroduodenoscopy N/A 12/01/2013    Procedure: ESOPHAGOGASTRODUODENOSCOPY (EGD); Surgeon: MJeryl Columbia MD; [facility]; Service: Endoscopy; Laterality: N/A;  . Esophagogastroduodenoscopy   12/01/2013    Social History: Social History   Social History  . Marital Status: Married    Spouse Name: N/A  . Number of Children: N/A  . Years of Education: N/A   Occupational History  . Not on file.   Social History Main Topics  . Smoking status: Former SResearch scientist (life sciences) . Smokeless tobacco: Never Used     Comment: quit smoking [id number]  . Alcohol Use: Yes     Comment: 4 oz drink daily  . Drug Use: No  . Sexual Activity: No     Comment: unhappy in his marriage   Other Topics Concern  . Not on file   Social History Narrative   Lives at home with wife. Able to transfer from bed to wheelchair   Allergies  Allergen Reactions  . Morphine And Related Hives and Shortness Of Breath  . Statins Hives    Pt notes that "all" classes of cholesterol meds do the same   . Allopurinol Hives, Swelling and Rash  Family history:  Family History  Problem Relation Age of Onset  . Diabetes Father   . Glaucoma Father   . Cancer Brother     1 died of lung cancer, 1 of colon, 1 of different cancer  . Glaucoma Daughter   . Glaucoma Son       Prior to Admission medications   Medication Sig Start Date End Date Taking? Authorizing Provider  ALPRAZolam (XANAX) 0.25 MG tablet Take 1 tablet (0.25 mg total) by mouth at bedtime as needed for anxiety. 09/25/14  Yes John C Lalonde, MD  Blood Glucose Monitoring Suppl (FREESTYLE FREEDOM LITE) W/DEVICE KIT 1 each by Does not apply route 2 (two) times daily. Patient test bid DX:E11.9 06/15/14  Yes John C Lalonde, MD  cholecalciferol (VITAMIN D) 1000 UNITS tablet Take 1,000 Units by mouth daily.   Yes Historical Provider, MD  colchicine 0.6 MG tablet Take 0.6 mg by mouth daily.   Yes Historical Provider, MD  dorzolamide-timolol (COSOPT) 22.3-6.8 MG/ML ophthalmic solution Place 1 drop into both eyes 2 (two) times  daily.    Yes Historical Provider, MD  febuxostat (ULORIC) 40 MG tablet Take 40 mg by mouth daily.   Yes Historical Provider, MD  ferrous gluconate (FERGON) 325 MG tablet Take 325 mg by mouth daily with breakfast.    Yes Historical Provider, MD  furosemide (LASIX) 20 MG tablet 1 daily as needed Patient taking differently: Take 20 mg by mouth daily as needed for fluid or edema.  09/28/14  Yes John C Lalonde, MD  glucose blood (FREESTYLE LITE) test strip Patient is to test bid DX: E11.9Use as instructed 05/18/14  Yes John C Lalonde, MD  hydrOXYzine (ATARAX/VISTARIL) 25 MG tablet Take 1 tablet (25 mg total) by mouth 3 (three) times daily as needed. Patient taking differently: Take 25 mg by mouth 3 (three) times daily as needed for anxiety or itching.  02/01/15  Yes John C Lalonde, MD  latanoprost (XALATAN) 0.005 % ophthalmic solution Place 1 drop into the right eye at bedtime.    Yes Historical Provider, MD  levothyroxine (SYNTHROID, LEVOTHROID) 75 MCG tablet Take 1 tablet (75 mcg total) by mouth daily before breakfast. 04/20/14  Yes John C Lalonde, MD  lisinopril (PRINIVIL,ZESTRIL) 10 MG tablet Take 1 tablet (10 mg total) by mouth daily. Patient is to take one tablet 09/23/14  Yes Corinna L Sullivan, MD  loratadine (CLARITIN) 10 MG tablet Take 10 mg by mouth daily as needed for allergies.    Yes Historical Provider, MD  Multiple Vitamins-Minerals (MULTIVITAMIN WITH MINERALS) tablet Take 1 tablet by mouth daily.   Yes Historical Provider, MD  nortriptyline (PAMELOR) 50 MG capsule Take 1 capsule (50 mg total) by mouth at bedtime. 05/12/14  Yes John C Lalonde, MD  Omega-3 Fatty Acids (FISH OIL) 1000 MG CAPS Take 1,000 mg by mouth daily.   Yes Historical Provider, MD  pantoprazole (PROTONIX) 20 MG tablet Take 1 tablet (20 mg total) by mouth 2 (two) times daily before a meal. 12/01/13  Yes Costin M Gherghe, MD  PARoxetine (PAXIL)  40 MG tablet Take 1 tablet (40 mg total) by mouth every morning. 12/25/14  Yes David S Tysinger, PA-C  polyethylene glycol (MIRALAX / GLYCOLAX) packet Take 17 g by mouth 2 (two) times daily. 02/01/14  Yes Christina P Rama, MD  cephALEXin (KEFLEX) 500 MG capsule Take 1 capsule (500 mg total) by mouth 2 (two) times daily. Patient not taking: Reported on 02/13/2015 09/23/14   Corinna L Sullivan,   MD     Physical Exam: Filed Vitals:   03/01/2015 1300 03/07/2015 1320 03/05/2015 1330 03/08/2015 1400  BP: 106/51  115/47 116/52  Pulse: 72  75 75  Temp:  97.9 F (36.6 C)    TempSrc:  Oral    Resp: 19  14 13  Height:      Weight:      SpO2: 99%  92% 100%     General: Elderly male laying in bed in no acute distress HEENT: Normocephalic and Atraumatic, Mucous membranes pink  PERRLA; EOM intact; No scleral icterus,   Nares: Patent, Oropharynx: Clear, Fair Dentition   Neck: FROM, no cervical lymphadenopathy, thyromegaly, carotid bruit or JVD;  Breasts: deferred CHEST WALL: No tenderness  CHEST: Normal respiration, clear to auscultation bilaterally  HEART: Regular rate and rhythm; 1/6 systolic murmurs at right upper sternal border radiating to his carotids- no rubs or gallops  BACK: No kyphosis or scoliosis; no CVA tenderness  GI: Positive Bowel Sounds, soft, non-tender; no masses, no organomegaly Rectal Exam: deferred MSK: No cyanosis, clubbing, or edema Genitalia: not examined  SKIN: Erythema and sacral area with mild warmth, erythema on left leg with increased warmth-no tenderness-numerous amount of scabs in the leg area-numerous bruises throughout body   CNS: Alert and Oriented - he knows where he is and the year, thinks the month is October, Nonfocal exam, CN 2-12 intact  Labs on Admission:  Basic Metabolic Panel:  Last Labs      Recent Labs Lab 03/08/2015 1122  NA 138  K  3.7  CL 107  CO2 23  GLUCOSE 139*  BUN 49*  CREATININE 2.62*  CALCIUM 8.5*     Liver Function Tests:  Last Labs      Recent Labs Lab 02/17/2015 1122  AST 18  ALT 13*  ALKPHOS 86  BILITOT 0.3  PROT 5.9*  ALBUMIN 2.7*      Last Labs     No results for input(s): LIPASE, AMYLASE in the last 168 hours.    Last Labs     No results for input(s): AMMONIA in the last 168 hours.   CBC:  Last Labs      Recent Labs Lab 03/02/2015 1122  WBC 13.9*  NEUTROABS 11.2*  HGB 10.0*  HCT 30.5*  MCV 96.5  PLT 186     Cardiac Enzymes:  Last Labs     No results for input(s): CKTOTAL, CKMB, CKMBINDEX, TROPONINI in the last 168 hours.    BNP (last 3 results)  Recent Labs (within last 365 days)    No results for input(s): BNP in the last 8760 hours.    ProBNP (last 3 results)  Recent Labs (within last 365 days)    No results for input(s): PROBNP in the last 8760 hours.    CBG:  Last Labs     No results for input(s): GLUCAP in the last 168 hours.    Radiological Exams on Admission:  Imaging Results (Last 48 hours)    Dg Chest 2 View  02/22/2015 CLINICAL DATA: Fever, shortness of breath. EXAM: CHEST 2 VIEW COMPARISON: September 20, 2014. FINDINGS: Stable cardiomegaly. No pneumothorax is noted. No significant pleural effusion is noted. Stable interstitial densities are noted throughout both lungs concerning for scarring. Increased airspace opacity is noted laterally in the right upper lobe concerning for superimposed inflammation or pneumonia. Degenerative changes seen involving the right glenohumeral joint. IMPRESSION: Stable interstitial densities are noted throughout both lungs consistent with scarring. New superimposed opacity seen   laterally in right upper lobe concerning for possible pneumonia ; followup radiographs are recommended. Electronically Signed By: Marijo Conception, M.D. On: 03-09-2015 12:26     EKG: Independently  reviewed. 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 -does not appear to be on medication for this at home - Place-on low-dose sliding scale for now   Hypothyroidism --Continue Synthroid   Chronic gouty arthropathy with tophus (tophi) -Continue colchicine and Uloric   Hypotension with history of Essential hypertension -Presenting with hypotension-hold lisinopril   Chronic kidney disease, stage IV (severe) -Creatinine appears to be at/near baseline   Code Status: Full code per patient's request Family Communication:  DVT Prophylaxis: Lovenox  Time spent: 51 minutes  [place], MD [facility]  If 7PM-7AM, please contact night-coverage [email or web address] 2015/03/09, 2:38 PM                              [place]  [facility] 02/26/2015, 9:16 AM

## 2015-03-13 DEATH — deceased

## 2015-04-06 ENCOUNTER — Other Ambulatory Visit: Payer: Self-pay | Admitting: Family Medicine
